# Patient Record
Sex: Female | Born: 1955 | Race: White | Hispanic: No | Smoking: Never smoker
Health system: Southern US, Community
[De-identification: ages and names within clinical notes are randomized; demographics above are authoritative.]

## PROBLEM LIST (undated history)

## (undated) DIAGNOSIS — C50919 Malignant neoplasm of unspecified site of unspecified female breast: Secondary | ICD-10-CM

## (undated) DIAGNOSIS — M1990 Unspecified osteoarthritis, unspecified site: Secondary | ICD-10-CM

## (undated) DIAGNOSIS — E7800 Pure hypercholesterolemia, unspecified: Secondary | ICD-10-CM

## (undated) DIAGNOSIS — Z803 Family history of malignant neoplasm of breast: Secondary | ICD-10-CM

## (undated) DIAGNOSIS — R112 Nausea with vomiting, unspecified: Secondary | ICD-10-CM

## (undated) DIAGNOSIS — Z9889 Other specified postprocedural states: Secondary | ICD-10-CM

## (undated) HISTORY — DX: Malignant neoplasm of unspecified site of unspecified female breast: C50.919

## (undated) HISTORY — PX: CHOLECYSTECTOMY: SHX55

## (undated) HISTORY — PX: OTHER SURGICAL HISTORY: SHX169

## (undated) HISTORY — DX: Pure hypercholesterolemia, unspecified: E78.00

## (undated) HISTORY — DX: Family history of malignant neoplasm of breast: Z80.3

---

## 1998-06-05 ENCOUNTER — Emergency Department (HOSPITAL_COMMUNITY): Admission: EM | Admit: 1998-06-05 | Discharge: 1998-06-05 | Payer: Self-pay | Admitting: Emergency Medicine

## 1999-03-23 ENCOUNTER — Emergency Department (HOSPITAL_COMMUNITY): Admission: EM | Admit: 1999-03-23 | Discharge: 1999-03-23 | Payer: Self-pay | Admitting: Emergency Medicine

## 1999-04-04 ENCOUNTER — Emergency Department (HOSPITAL_COMMUNITY): Admission: EM | Admit: 1999-04-04 | Discharge: 1999-04-04 | Payer: Self-pay | Admitting: Emergency Medicine

## 1999-06-21 ENCOUNTER — Emergency Department (HOSPITAL_COMMUNITY): Admission: EM | Admit: 1999-06-21 | Discharge: 1999-06-21 | Payer: Self-pay | Admitting: Emergency Medicine

## 1999-08-10 ENCOUNTER — Emergency Department (HOSPITAL_COMMUNITY): Admission: EM | Admit: 1999-08-10 | Discharge: 1999-08-10 | Payer: Self-pay | Admitting: *Deleted

## 1999-08-15 ENCOUNTER — Emergency Department (HOSPITAL_COMMUNITY): Admission: EM | Admit: 1999-08-15 | Discharge: 1999-08-15 | Payer: Self-pay | Admitting: Emergency Medicine

## 1999-09-05 ENCOUNTER — Emergency Department (HOSPITAL_COMMUNITY): Admission: EM | Admit: 1999-09-05 | Discharge: 1999-09-05 | Payer: Self-pay | Admitting: Emergency Medicine

## 1999-09-09 ENCOUNTER — Emergency Department (HOSPITAL_COMMUNITY): Admission: EM | Admit: 1999-09-09 | Discharge: 1999-09-09 | Payer: Self-pay | Admitting: Emergency Medicine

## 1999-09-18 ENCOUNTER — Emergency Department (HOSPITAL_COMMUNITY): Admission: EM | Admit: 1999-09-18 | Discharge: 1999-09-18 | Payer: Self-pay | Admitting: Emergency Medicine

## 1999-11-01 ENCOUNTER — Emergency Department (HOSPITAL_COMMUNITY): Admission: EM | Admit: 1999-11-01 | Discharge: 1999-11-01 | Payer: Self-pay | Admitting: Emergency Medicine

## 1999-11-08 ENCOUNTER — Emergency Department (HOSPITAL_COMMUNITY): Admission: EM | Admit: 1999-11-08 | Discharge: 1999-11-08 | Payer: Self-pay | Admitting: *Deleted

## 1999-11-30 ENCOUNTER — Emergency Department (HOSPITAL_COMMUNITY): Admission: EM | Admit: 1999-11-30 | Discharge: 1999-11-30 | Payer: Self-pay | Admitting: Emergency Medicine

## 1999-12-23 ENCOUNTER — Emergency Department (HOSPITAL_COMMUNITY): Admission: EM | Admit: 1999-12-23 | Discharge: 1999-12-23 | Payer: Self-pay | Admitting: Emergency Medicine

## 2000-01-11 ENCOUNTER — Emergency Department (HOSPITAL_COMMUNITY): Admission: EM | Admit: 2000-01-11 | Discharge: 2000-01-11 | Payer: Self-pay | Admitting: Emergency Medicine

## 2000-01-13 ENCOUNTER — Inpatient Hospital Stay (HOSPITAL_COMMUNITY): Admission: EM | Admit: 2000-01-13 | Discharge: 2000-01-16 | Payer: Self-pay | Admitting: *Deleted

## 2000-01-19 ENCOUNTER — Emergency Department (HOSPITAL_COMMUNITY): Admission: EM | Admit: 2000-01-19 | Discharge: 2000-01-19 | Payer: Self-pay | Admitting: *Deleted

## 2000-01-20 ENCOUNTER — Emergency Department (HOSPITAL_COMMUNITY): Admission: EM | Admit: 2000-01-20 | Discharge: 2000-01-20 | Payer: Self-pay | Admitting: Emergency Medicine

## 2000-01-21 ENCOUNTER — Encounter: Payer: Self-pay | Admitting: Emergency Medicine

## 2000-01-22 ENCOUNTER — Emergency Department (HOSPITAL_COMMUNITY): Admission: EM | Admit: 2000-01-22 | Discharge: 2000-01-22 | Payer: Self-pay | Admitting: Emergency Medicine

## 2000-01-22 ENCOUNTER — Ambulatory Visit (HOSPITAL_COMMUNITY): Admission: RE | Admit: 2000-01-22 | Discharge: 2000-01-22 | Payer: Self-pay | Admitting: Emergency Medicine

## 2000-01-22 ENCOUNTER — Encounter: Payer: Self-pay | Admitting: Emergency Medicine

## 2000-03-07 ENCOUNTER — Emergency Department (HOSPITAL_COMMUNITY): Admission: EM | Admit: 2000-03-07 | Discharge: 2000-03-07 | Payer: Self-pay | Admitting: Emergency Medicine

## 2000-04-27 ENCOUNTER — Inpatient Hospital Stay (HOSPITAL_COMMUNITY): Admission: EM | Admit: 2000-04-27 | Discharge: 2000-04-30 | Payer: Self-pay | Admitting: *Deleted

## 2000-06-17 ENCOUNTER — Emergency Department (HOSPITAL_COMMUNITY): Admission: EM | Admit: 2000-06-17 | Discharge: 2000-06-17 | Payer: Self-pay | Admitting: Emergency Medicine

## 2000-10-04 ENCOUNTER — Emergency Department (HOSPITAL_COMMUNITY): Admission: EM | Admit: 2000-10-04 | Discharge: 2000-10-04 | Payer: Self-pay | Admitting: *Deleted

## 2000-10-05 ENCOUNTER — Emergency Department (HOSPITAL_COMMUNITY): Admission: EM | Admit: 2000-10-05 | Discharge: 2000-10-05 | Payer: Self-pay | Admitting: Emergency Medicine

## 2000-10-26 ENCOUNTER — Inpatient Hospital Stay (HOSPITAL_COMMUNITY): Admission: EM | Admit: 2000-10-26 | Discharge: 2000-10-28 | Payer: Self-pay | Admitting: Emergency Medicine

## 2001-03-06 ENCOUNTER — Emergency Department (HOSPITAL_COMMUNITY): Admission: EM | Admit: 2001-03-06 | Discharge: 2001-03-06 | Payer: Self-pay

## 2001-04-30 ENCOUNTER — Emergency Department (HOSPITAL_COMMUNITY): Admission: EM | Admit: 2001-04-30 | Discharge: 2001-04-30 | Payer: Self-pay | Admitting: Emergency Medicine

## 2001-08-26 ENCOUNTER — Emergency Department (HOSPITAL_COMMUNITY): Admission: EM | Admit: 2001-08-26 | Discharge: 2001-08-26 | Payer: Self-pay | Admitting: Emergency Medicine

## 2001-10-03 ENCOUNTER — Emergency Department (HOSPITAL_COMMUNITY): Admission: EM | Admit: 2001-10-03 | Discharge: 2001-10-03 | Payer: Self-pay | Admitting: Emergency Medicine

## 2001-11-15 ENCOUNTER — Emergency Department (HOSPITAL_COMMUNITY): Admission: EM | Admit: 2001-11-15 | Discharge: 2001-11-15 | Payer: Self-pay

## 2001-11-16 ENCOUNTER — Emergency Department (HOSPITAL_COMMUNITY): Admission: EM | Admit: 2001-11-16 | Discharge: 2001-11-16 | Payer: Self-pay | Admitting: Emergency Medicine

## 2001-12-04 ENCOUNTER — Emergency Department (HOSPITAL_COMMUNITY): Admission: EM | Admit: 2001-12-04 | Discharge: 2001-12-04 | Payer: Self-pay | Admitting: *Deleted

## 2002-02-13 ENCOUNTER — Emergency Department (HOSPITAL_COMMUNITY): Admission: EM | Admit: 2002-02-13 | Discharge: 2002-02-13 | Payer: Self-pay | Admitting: Emergency Medicine

## 2002-04-14 ENCOUNTER — Emergency Department (HOSPITAL_COMMUNITY): Admission: EM | Admit: 2002-04-14 | Discharge: 2002-04-14 | Payer: Self-pay | Admitting: Emergency Medicine

## 2002-06-21 ENCOUNTER — Emergency Department (HOSPITAL_COMMUNITY): Admission: EM | Admit: 2002-06-21 | Discharge: 2002-06-21 | Payer: Self-pay | Admitting: Emergency Medicine

## 2002-09-27 ENCOUNTER — Emergency Department (HOSPITAL_COMMUNITY): Admission: EM | Admit: 2002-09-27 | Discharge: 2002-09-27 | Payer: Self-pay | Admitting: Emergency Medicine

## 2002-11-03 ENCOUNTER — Emergency Department (HOSPITAL_COMMUNITY): Admission: EM | Admit: 2002-11-03 | Discharge: 2002-11-04 | Payer: Self-pay | Admitting: Emergency Medicine

## 2003-02-23 ENCOUNTER — Emergency Department (HOSPITAL_COMMUNITY): Admission: EM | Admit: 2003-02-23 | Discharge: 2003-02-23 | Payer: Self-pay | Admitting: Emergency Medicine

## 2003-03-01 ENCOUNTER — Ambulatory Visit (HOSPITAL_BASED_OUTPATIENT_CLINIC_OR_DEPARTMENT_OTHER): Admission: RE | Admit: 2003-03-01 | Discharge: 2003-03-01 | Payer: Self-pay | Admitting: Obstetrics and Gynecology

## 2003-05-21 ENCOUNTER — Emergency Department (HOSPITAL_COMMUNITY): Admission: EM | Admit: 2003-05-21 | Discharge: 2003-05-21 | Payer: Self-pay | Admitting: Emergency Medicine

## 2003-05-22 ENCOUNTER — Emergency Department (HOSPITAL_COMMUNITY): Admission: EM | Admit: 2003-05-22 | Discharge: 2003-05-22 | Payer: Self-pay | Admitting: Emergency Medicine

## 2004-02-10 ENCOUNTER — Inpatient Hospital Stay (HOSPITAL_COMMUNITY): Admission: EM | Admit: 2004-02-10 | Discharge: 2004-02-14 | Payer: Self-pay | Admitting: Emergency Medicine

## 2004-02-19 ENCOUNTER — Emergency Department (HOSPITAL_COMMUNITY): Admission: EM | Admit: 2004-02-19 | Discharge: 2004-02-19 | Payer: Self-pay | Admitting: Emergency Medicine

## 2004-11-14 ENCOUNTER — Ambulatory Visit (HOSPITAL_COMMUNITY): Admission: RE | Admit: 2004-11-14 | Discharge: 2004-11-14 | Payer: Self-pay | Admitting: Internal Medicine

## 2006-10-05 ENCOUNTER — Emergency Department (HOSPITAL_COMMUNITY): Admission: EM | Admit: 2006-10-05 | Discharge: 2006-10-05 | Payer: Self-pay | Admitting: Emergency Medicine

## 2010-10-05 ENCOUNTER — Encounter: Payer: Self-pay | Admitting: Internal Medicine

## 2010-12-03 ENCOUNTER — Emergency Department (HOSPITAL_COMMUNITY): Payer: Self-pay

## 2010-12-03 ENCOUNTER — Emergency Department (HOSPITAL_COMMUNITY)
Admission: EM | Admit: 2010-12-03 | Discharge: 2010-12-03 | Disposition: A | Discharge status: Home / Self Care | Payer: Self-pay | Attending: Emergency Medicine | Admitting: Emergency Medicine

## 2010-12-03 DIAGNOSIS — R110 Nausea: Secondary | ICD-10-CM | POA: Insufficient documentation

## 2010-12-03 DIAGNOSIS — R5381 Other malaise: Secondary | ICD-10-CM | POA: Insufficient documentation

## 2010-12-03 DIAGNOSIS — R42 Dizziness and giddiness: Secondary | ICD-10-CM | POA: Insufficient documentation

## 2010-12-03 DIAGNOSIS — R079 Chest pain, unspecified: Secondary | ICD-10-CM | POA: Insufficient documentation

## 2010-12-03 LAB — COMPREHENSIVE METABOLIC PANEL
ALT: 19 U/L (ref 0–35)
AST: 21 U/L (ref 0–37)
Albumin: 3.7 g/dL (ref 3.5–5.2)
Alkaline Phosphatase: 98 U/L (ref 39–117)
BUN: 10 mg/dL (ref 6–23)
CO2: 26 mEq/L (ref 19–32)
Calcium: 9.2 mg/dL (ref 8.4–10.5)
Chloride: 106 mEq/L (ref 96–112)
Creatinine, Ser: 0.92 mg/dL (ref 0.4–1.2)
GFR calc Af Amer: >60 mL/min (ref >60)
GFR calc non Af Amer: >60 mL/min (ref >60)
Glucose, Bld: 98 mg/dL (ref 70–99)
Potassium: 4.1 mEq/L (ref 3.5–5.1)
Sodium: 140 mEq/L (ref 135–145)
Total Bilirubin: 0.3 mg/dL (ref 0.3–1.2)
Total Protein: 7.1 g/dL (ref 6.0–8.3)

## 2010-12-03 LAB — CBC
HCT: 41.2 % (ref 36.0–46.0)
Hemoglobin: 14 g/dL (ref 12.0–15.0)
MCH: 29.5 pg (ref 26.0–34.0)
MCHC: 34 g/dL (ref 30.0–36.0)
MCV: 86.9 fL (ref 78.0–100.0)
Platelets: 276 10*3/uL (ref 150–400)
RBC: 4.74 MIL/uL (ref 3.87–5.11)
RDW: 14 % (ref 11.5–15.5)
WBC: 8.7 10*3/uL (ref 4.0–10.5)

## 2010-12-03 LAB — URINALYSIS, ROUTINE W REFLEX MICROSCOPIC
Bilirubin Urine: NEGATIVE
Glucose, UA: NEGATIVE mg/dL
Hgb urine dipstick: NEGATIVE
Ketones, ur: NEGATIVE mg/dL
Nitrite: NEGATIVE
Protein, ur: NEGATIVE mg/dL
Specific Gravity, Urine: 1.03 (ref 1.005–1.030)
Urobilinogen, UA: 0.2 mg/dL (ref 0.0–1.0)
pH: 5 (ref 5.0–8.0)

## 2010-12-03 LAB — DIFFERENTIAL
Basophils Absolute: 0 10*3/uL (ref 0.0–0.1)
Basophils Relative: 1 % (ref 0–1)
Eosinophils Absolute: 0.2 10*3/uL (ref 0.0–0.7)
Eosinophils Relative: 3 % (ref 0–5)
Lymphocytes Relative: 26 % (ref 12–46)
Lymphs Abs: 2.3 10*3/uL (ref 0.7–4.0)
Monocytes Absolute: 0.5 10*3/uL (ref 0.1–1.0)
Monocytes Relative: 6 % (ref 3–12)
Neutro Abs: 5.6 10*3/uL (ref 1.7–7.7)
Neutrophils Relative %: 65 % (ref 43–77)

## 2010-12-03 LAB — POCT CARDIAC MARKERS
CKMB, poc: 1.4 ng/mL (ref 1.0–8.0)
Myoglobin, poc: 56.2 ng/mL (ref 12–200)
Troponin i, poc: <0.05 ng/mL (ref 0.00–0.09)

## 2010-12-07 ENCOUNTER — Emergency Department (HOSPITAL_COMMUNITY)
Admission: EM | Admit: 2010-12-07 | Discharge: 2010-12-08 | Disposition: A | Discharge status: Home / Self Care | Payer: Self-pay | Attending: Emergency Medicine | Admitting: Emergency Medicine

## 2010-12-07 ENCOUNTER — Emergency Department (HOSPITAL_COMMUNITY): Payer: Self-pay

## 2010-12-07 DIAGNOSIS — R079 Chest pain, unspecified: Secondary | ICD-10-CM | POA: Insufficient documentation

## 2010-12-07 DIAGNOSIS — R071 Chest pain on breathing: Secondary | ICD-10-CM | POA: Insufficient documentation

## 2010-12-07 DIAGNOSIS — R0602 Shortness of breath: Secondary | ICD-10-CM | POA: Insufficient documentation

## 2010-12-07 LAB — CBC
HCT: 41.2 % (ref 36.0–46.0)
Hemoglobin: 14.1 g/dL (ref 12.0–15.0)
MCH: 29.3 pg (ref 26.0–34.0)
MCHC: 34.2 g/dL (ref 30.0–36.0)
MCV: 85.7 fL (ref 78.0–100.0)
Platelets: 310 10*3/uL (ref 150–400)
RBC: 4.81 MIL/uL (ref 3.87–5.11)
RDW: 13.9 % (ref 11.5–15.5)
WBC: 10.7 10*3/uL — ABNORMAL HIGH (ref 4.0–10.5)

## 2010-12-07 LAB — DIFFERENTIAL
Basophils Absolute: 0.1 10*3/uL (ref 0.0–0.1)
Basophils Relative: 1 % (ref 0–1)
Eosinophils Absolute: 0.3 10*3/uL (ref 0.0–0.7)
Eosinophils Relative: 3 % (ref 0–5)
Lymphocytes Relative: 34 % (ref 12–46)
Lymphs Abs: 3.6 10*3/uL (ref 0.7–4.0)
Monocytes Absolute: 0.6 10*3/uL (ref 0.1–1.0)
Monocytes Relative: 6 % (ref 3–12)
Neutro Abs: 6 10*3/uL (ref 1.7–7.7)
Neutrophils Relative %: 56 % (ref 43–77)

## 2010-12-07 LAB — COMPREHENSIVE METABOLIC PANEL
ALT: 33 U/L (ref 0–35)
AST: 37 U/L (ref 0–37)
Albumin: 3.8 g/dL (ref 3.5–5.2)
Alkaline Phosphatase: 91 U/L (ref 39–117)
BUN: 13 mg/dL (ref 6–23)
CO2: 21 mEq/L (ref 19–32)
Calcium: 9.1 mg/dL (ref 8.4–10.5)
Chloride: 101 mEq/L (ref 96–112)
Creatinine, Ser: 1.03 mg/dL (ref 0.4–1.2)
GFR calc Af Amer: >60 mL/min (ref >60)
GFR calc non Af Amer: 55 mL/min — ABNORMAL LOW (ref >60)
Glucose, Bld: 92 mg/dL (ref 70–99)
Potassium: 4.2 mEq/L (ref 3.5–5.1)
Sodium: 133 mEq/L — ABNORMAL LOW (ref 135–145)
Total Bilirubin: 0.4 mg/dL (ref 0.3–1.2)
Total Protein: 6.8 g/dL (ref 6.0–8.3)

## 2010-12-07 LAB — POCT CARDIAC MARKERS
CKMB, poc: 1.3 ng/mL (ref 1.0–8.0)
Myoglobin, poc: 91.1 ng/mL (ref 12–200)
Troponin i, poc: <0.05 ng/mL (ref 0.00–0.09)

## 2012-05-15 IMAGING — CR DG ABDOMEN ACUTE W/ 1V CHEST
3 series · 3 of 3 positions shown · non-contrast
Comparison: 02/10/2004.

CLINICAL DATA: Nausea and left-sided rib pain.

ACUTE ABDOMEN SERIES (ABDOMEN 2 VIEW & CHEST 1 VIEW)

[view not recorded (1 of 3)]
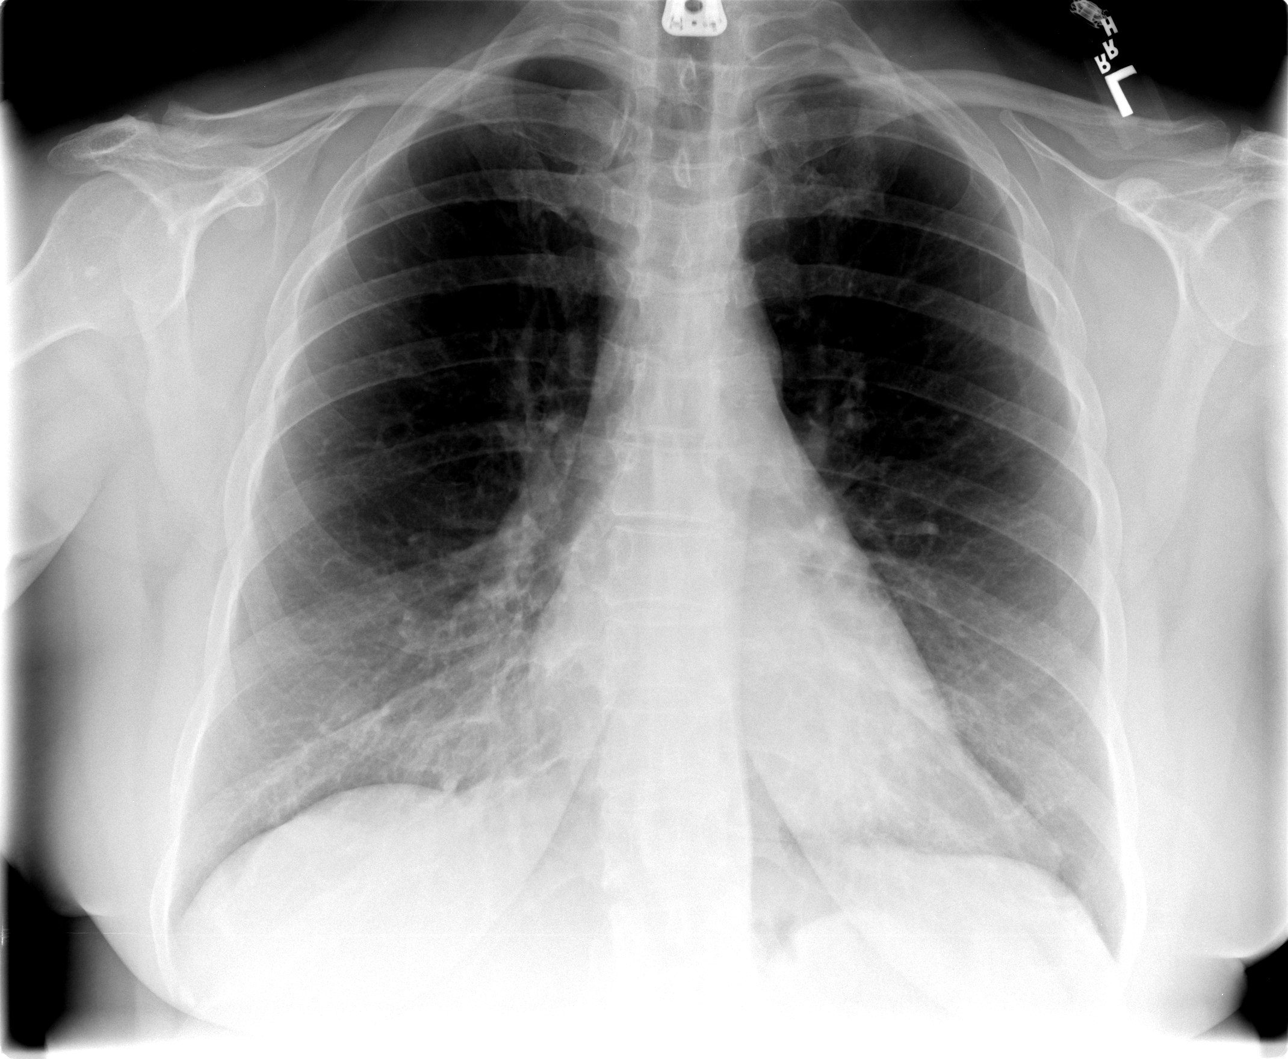

[view not recorded (2 of 3)]
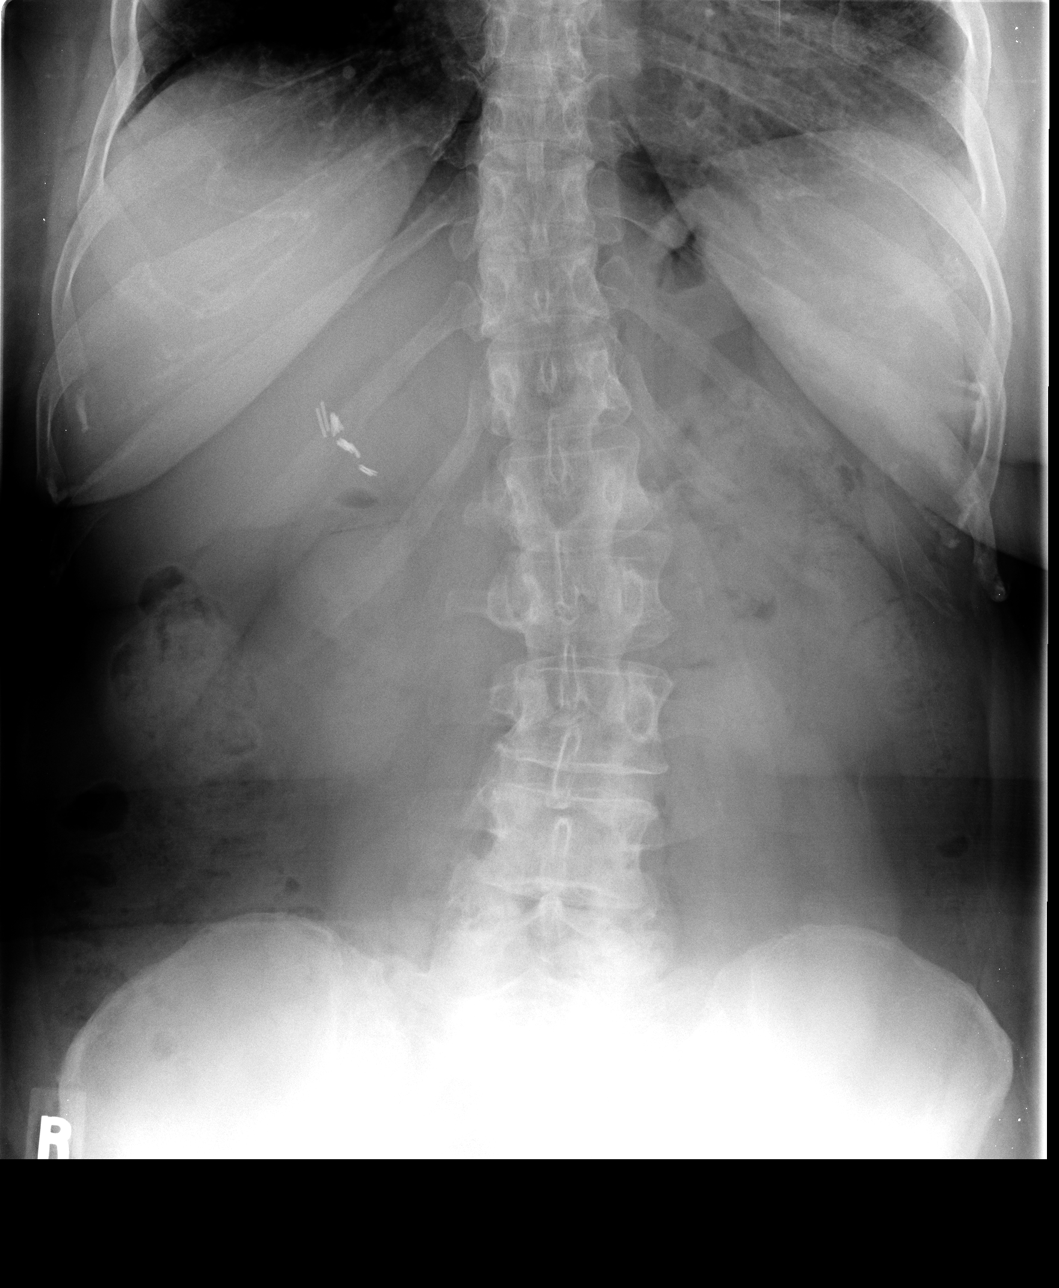

[view not recorded (3 of 3)]
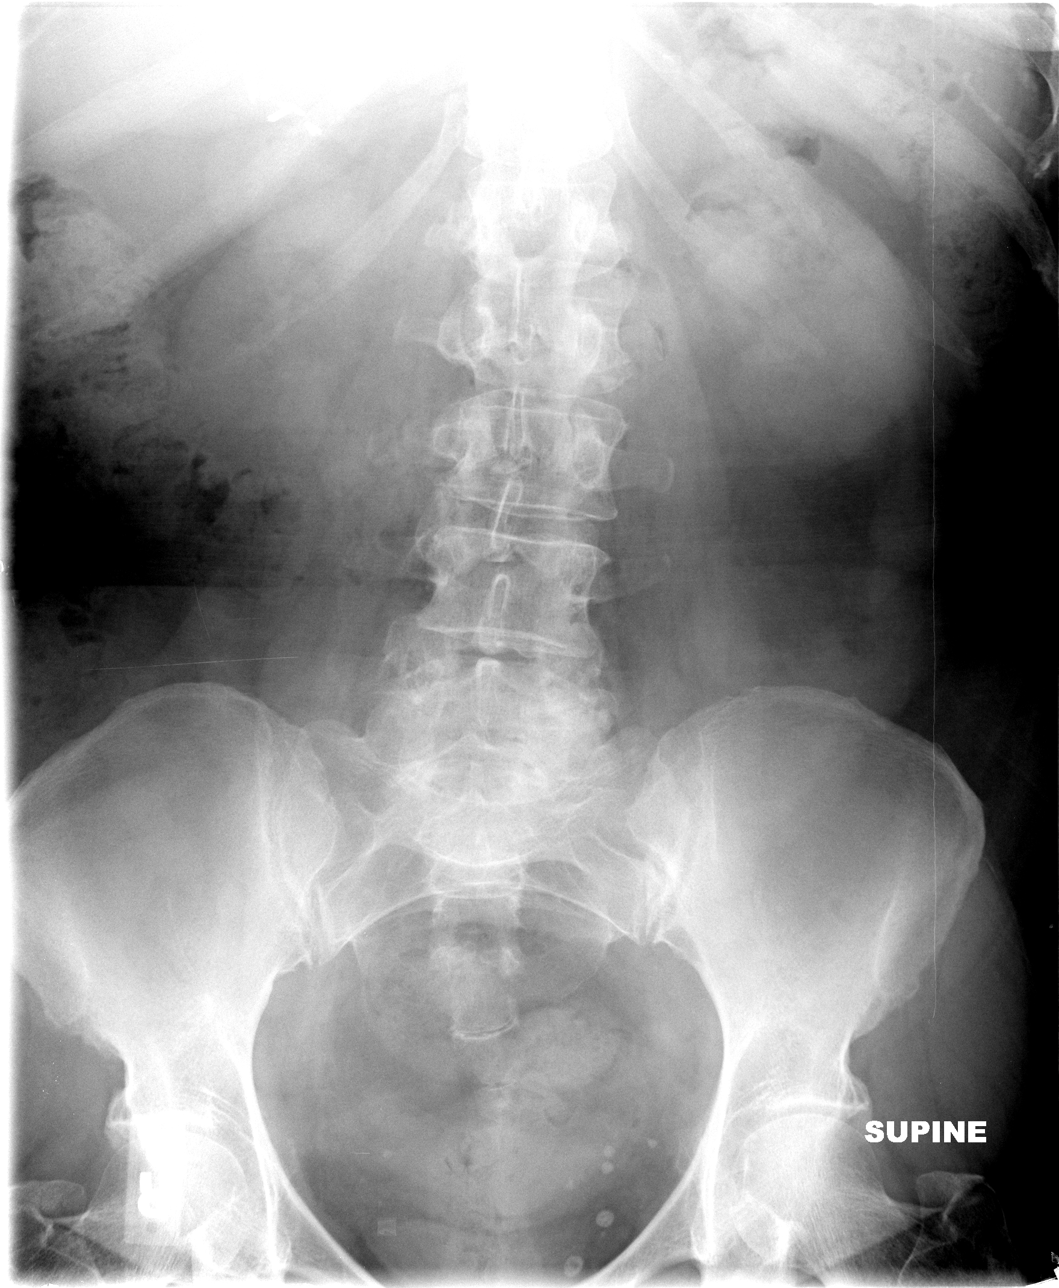

[3 of 3 positions shown; findings below may reference images not displayed]

FINDINGS: The lungs are clear without focal infiltrate, edema,
pneumothorax or pleural effusion. Interstitial markings are
diffusely coarsened with chronic features. The cardiopericardial
silhouette is within normal limits for size.

Upright film shows no evidence for intraperitoneal free air. There
is no evidence for gaseous bowel dilation to suggest obstruction.
Surgical clips in the right upper quadrant suggest prior
cholecystectomy.  Convex leftward rotary scoliosis of the lumbar
spine is stable.
IMPRESSION: 1.  No acute cardiopulmonary findings.
2.  No evidence for bowel obstruction or perforation.

## 2020-05-23 ENCOUNTER — Encounter (HOSPITAL_COMMUNITY): Payer: Self-pay | Admitting: Emergency Medicine

## 2020-05-23 ENCOUNTER — Emergency Department (HOSPITAL_COMMUNITY): Payer: Medicare Other

## 2020-05-23 ENCOUNTER — Emergency Department (HOSPITAL_COMMUNITY)
Admission: EM | Admit: 2020-05-23 | Discharge: 2020-05-23 | Disposition: A | Discharge status: Home / Self Care | Payer: Medicare Other | Attending: Emergency Medicine | Admitting: Emergency Medicine

## 2020-05-23 ENCOUNTER — Other Ambulatory Visit: Payer: Self-pay

## 2020-05-23 DIAGNOSIS — X509XXA Other and unspecified overexertion or strenuous movements or postures, initial encounter: Secondary | ICD-10-CM | POA: Diagnosis not present

## 2020-05-23 DIAGNOSIS — Y939 Activity, unspecified: Secondary | ICD-10-CM | POA: Insufficient documentation

## 2020-05-23 DIAGNOSIS — Y999 Unspecified external cause status: Secondary | ICD-10-CM | POA: Diagnosis not present

## 2020-05-23 DIAGNOSIS — Y929 Unspecified place or not applicable: Secondary | ICD-10-CM | POA: Insufficient documentation

## 2020-05-23 DIAGNOSIS — S42295A Other nondisplaced fracture of upper end of left humerus, initial encounter for closed fracture: Secondary | ICD-10-CM

## 2020-05-23 DIAGNOSIS — M79602 Pain in left arm: Secondary | ICD-10-CM | POA: Diagnosis present

## 2020-05-23 MED ORDER — MORPHINE SULFATE (PF) 4 MG/ML IV SOLN
4.0000 mg | Freq: Once | INTRAVENOUS | Status: AC
  Administered 2020-05-23: 4 mg via INTRAVENOUS
  Filled 2020-05-23: qty 1

## 2020-05-23 MED ORDER — OXYCODONE-ACETAMINOPHEN 5-325 MG PO TABS: 1.0000 | ORAL_TABLET | Freq: Four times a day (QID) | ORAL | 0 refills | Status: AC | PRN

## 2020-05-23 MED ORDER — ONDANSETRON 4 MG PO TBDP
4.0000 mg | ORAL_TABLET | Freq: Once | ORAL | Status: AC
  Administered 2020-05-23: 4 mg via ORAL
  Filled 2020-05-23: qty 1

## 2020-05-23 NOTE — ED Notes (Signed)
Gold ring with red stone removed from pt left ring finger. Pt tolerated well. Ring placed back in pocket of pt shirt.

## 2020-05-23 NOTE — Discharge Instructions (Signed)
Please use the Percocet as prescribed for pain.  You may also use Tylenol and ibuprofen as described below.  Pushing plenty of water.  You may use some ice on your arm to help with the swelling and pain.  Please use Tylenol or ibuprofen for pain.  You may use 600 mg ibuprofen every 6 hours or 1000 mg of Tylenol every 6 hours.  You may choose to alternate between the 2.  This would be most effective.  Not to exceed 4 g of Tylenol within 24 hours.  Not to exceed 3200 mg ibuprofen 24 hours.  Please call tomorrow morning to make an appointment.  I discussed her case with Dr. Stann Mainland whose information I have placed in your discharge instructions.  You may also return to Dr. Aline Brochure who is here in [place] if you would prefer to see someone more local.  Please wear your sling at all times until seen and cleared by orthopedics.

## 2020-05-23 NOTE — ED Triage Notes (Signed)
Pt reports was repositioning cat on pt shoulder and reports left arm pain primarily between top of left shoulder and left elbow. No obvious deformity noted. Pt tearful and limited ROM noted. Decreased cap refill, faint radial pulse.

## 2020-05-23 NOTE — ED Provider Notes (Signed)
[facility] Provider Note   CSN: [id number] Arrival date & time: 05/23/20  1440     History Chief Complaint  Patient presents with  . Arm Pain    Kaitlin Baker is a 64 y.o. female.  HPI Patient is 64 year old female with no pertinent past medical history states that she has not seen a primary care doctor in 15 years.  She is presented today for left humerus pain that began suddenly and with 10/10 pain when she was repositioning her cat who was on her right shoulder when she reached and pushed the cat bite upwards with her left hand she felt a sudden pop and heard a snapping sound in her left humerus and had severe stabbing pain that is been constant since.  She states the cat is about 18 pounds and that the movement was sudden.  She states she has had no other fractures.  No history of osteoporosis but states she has never been screened for this.  She denies any other pain.  She denies any aggravating or mitigating factors apart from movement and touch.  She has tried no medications prior to arrival and came straight to the ER.     History reviewed. No pertinent past medical history.  There are no problems to display for this patient.   History reviewed. No pertinent surgical history.   OB History   No obstetric history on file.     History reviewed. No pertinent family history.  Social History   Tobacco Use  . Smoking status: Never Smoker  . Smokeless tobacco: Never Used  Substance Use Topics  . Alcohol use: Not on file  . Drug use: Not on file    Home Medications Prior to Admission medications   Medication Sig Start Date End Date Taking? Authorizing Provider  oxyCODONE-acetaminophen (PERCOCET/ROXICET) 5-325 MG tablet Take 1 tablet by mouth every 6 (six) hours as needed for up to 7 days for severe pain. 05/23/20 05/30/20  Tedd Sias, PA    Allergies    Codeine  Review of Systems   Review of Systems  Physical Exam Updated Vital  Signs BP 123/74 (BP Location: Right Arm)   Pulse 82   Temp 98.6 F (37 C) (Oral)   Resp 19   Ht 5\' 6"  (1.676 m)   Wt 97.1 kg   SpO2 99%   BMI 34.54 kg/m   Physical Exam Vitals and nursing note reviewed.  Constitutional:      General: She is in acute distress.     Comments: Pleasant 64 year old female in acute distress holding her left arm  HENT:     Head: Normocephalic and atraumatic.     Nose: Nose normal.     Mouth/Throat:     Mouth: Mucous membranes are moist.  Eyes:     General: No scleral icterus. Cardiovascular:     Rate and Rhythm: Normal rate and regular rhythm.     Pulses: Normal pulses.     Heart sounds: Normal heart sounds.     Comments: 3+ metric radial pulses. Pulmonary:     Effort: Pulmonary effort is normal. No respiratory distress.     Breath sounds: No wheezing.  Chest:    Abdominal:     Palpations: Abdomen is soft.     Tenderness: There is no abdominal tenderness.  Musculoskeletal:     Cervical back: Normal range of motion.     Right lower leg: No edema.     Left lower leg:  No edema.     Comments: Tenderness to palpation of the left humerus.  No obvious deformity.  No bruising.  Skin:    General: Skin is warm and dry.     Capillary Refill: Capillary refill takes less than 2 seconds.     Comments: Breast exam completed with significantly indurated left breast in the left upper quadrant of the breast.  There is peau d'orange skin.  Neurological:     Mental Status: She is alert. Mental status is at baseline.     Comments: Radial, ulnar and medial nerve motor and sensation function intact.  Psychiatric:        Mood and Affect: Mood normal.        Behavior: Behavior normal.     ED Results / Procedures / Treatments   Labs (all labs ordered are listed, but only abnormal results are displayed) Labs Reviewed - No data to display  EKG None  Radiology CT HUMERUS LEFT WO CONTRAST  Result Date: 05/23/2020 CLINICAL DATA:  Left humeral fracture  EXAM: CT OF THE UPPER LEFT EXTREMITY WITHOUT CONTRAST TECHNIQUE: Multidetector CT imaging of the upper left extremity was performed according to the standard protocol. COMPARISON:  05/23/2020 FINDINGS: Bones/Joint/Cartilage Oblique proximal left humeral diaphyseal fracture is identified, with fracture fragment separated by approximately 6 mm. There is no evidence of cortical destruction or periosteal reaction. Soft tissue density within the marrow cavity may reflect red marrow or posttraumatic change. If underlying lesion and pathologic fracture is suspected, contrasted MRI is recommended. No other acute displaced fractures. Evidence of prior healed left lateral rib fracture. Ligaments Suboptimally assessed by CT. Muscles and Tendons No evidence of muscular injury. Soft tissues Nonspecific left axillary lymphadenopathy is identified, largest measuring up to 16 mm in short axis. IMPRESSION: 1. Minimally displaced oblique fracture of the proximal left humeral diaphysis. No definite CT evidence of underlying lesion. If pathologic fracture is suspected, MRI may be useful for further evaluation. 2. Nonspecific left axillary lymphadenopathy. Please correlate with any recent imaging of the left breast. Electronically Signed   By: Randa Ngo M.D.   On: 05/23/2020 18:41   DG Humerus Left  Result Date: 05/23/2020 CLINICAL DATA:  Left arm pain EXAM: LEFT HUMERUS - 2+ VIEW COMPARISON:  None. FINDINGS: Frontal and lateral views of the left humerus demonstrate an oblique fracture through the proximal humeral diaphysis, with approximately 11 mm of separation of the fracture fragments. Left shoulder is well aligned. Left elbow is unremarkable. Diffuse soft tissue edema. IMPRESSION: 1. Oblique proximal left humeral diaphyseal fracture with 11 mm of separation of the fracture fragment. Electronically Signed   By: Randa Ngo M.D.   On: 05/23/2020 15:51    Procedures Procedures (including critical care  time)  Medications Ordered in ED Medications  morphine 4 MG/ML injection 4 mg (4 mg Intravenous Given 05/23/20 1526)  ondansetron (ZOFRAN-ODT) disintegrating tablet 4 mg (4 mg Oral Given 05/23/20 1526)  morphine 4 MG/ML injection 4 mg (4 mg Intravenous Given 05/23/20 1618)    ED Course  I have reviewed the triage vital signs and the nursing notes.  Pertinent labs & imaging results that were available during my care of the patient were reviewed by me and considered in my medical decision making (see chart for details).  Patient is a 64 year old female with past medical history detailed above presented today for left humeral pain after lifting her cat up on her shoulder.  She has a displaced oblique left humeral fracture on x-ray.  She has no radial nerve palsy has good distal neurovascular status.  Pain was controlled with 2 doses of morphine.  She states as long she does not move it is not painful currently.  Clinical Course as of May 24 4  Thu May 23, 2020  1654 Discussed with Dr. Stann Mainland    [WF]    Clinical Course User Index [WF] Tedd Sias, [place]   Dr. Stann Mainland recommends sling immobilization and no splints at this time.  Recommend CT scan to evaluate for pathologic fracture given suspicion for this and discharged with follow-up in his office.  CT scan shows no lytic lesion or bony metastasis IMPRESSION:  1. Minimally displaced oblique fracture of the proximal left humeral  diaphysis. No definite CT evidence of underlying lesion. If  pathologic fracture is suspected, MRI may be useful for further  evaluation.  2. Nonspecific left axillary lymphadenopathy. Please correlate with  any recent imaging of the left breast.   I have very high suspicion for breast cancer given her breast exam and the very mild mechanism of injury that resulted in this significant left humeral fracture.  I counseled patient on the need for close follow-up with PCP.  She will also closely follow-up with Dr.  Stann Mainland of orthopedics.  I discussed this case with my attending physician who cosigned this note including patient's presenting symptoms, physical exam, and planned diagnostics and interventions. Attending physician stated agreement with plan or made changes to plan which were implemented.   Attending physician assessed patient at bedside.  Patient understanding of plan for discharge.  And has already had her Percocet filled by her husband.  MDM Rules/Calculators/A&P                           Final Clinical Impression(s) / ED Diagnoses Final diagnoses:  Other closed nondisplaced fracture of proximal end of left humerus, initial encounter    Rx / DC Orders ED Discharge Orders         Ordered    oxyCODONE-acetaminophen (PERCOCET/ROXICET) 5-325 MG tablet  Every 6 hours PRN        05/23/20 1808           Tedd Sias, [place] 05/24/20 0006    Davonna Belling, MD 05/27/20 [phone]

## 2020-06-12 ENCOUNTER — Other Ambulatory Visit: Payer: Self-pay | Admitting: Oncology

## 2020-06-13 ENCOUNTER — Other Ambulatory Visit: Payer: Self-pay | Admitting: *Deleted

## 2020-06-13 DIAGNOSIS — C50912 Malignant neoplasm of unspecified site of left female breast: Secondary | ICD-10-CM

## 2020-06-17 ENCOUNTER — Other Ambulatory Visit: Payer: Self-pay

## 2020-06-17 ENCOUNTER — Encounter (HOSPITAL_COMMUNITY): Payer: Self-pay | Admitting: Emergency Medicine

## 2020-06-17 ENCOUNTER — Inpatient Hospital Stay (HOSPITAL_COMMUNITY): Payer: Medicare Other | Attending: Hematology | Admitting: Hematology

## 2020-06-17 ENCOUNTER — Encounter (HOSPITAL_COMMUNITY): Payer: Self-pay | Admitting: Hematology

## 2020-06-17 ENCOUNTER — Emergency Department (HOSPITAL_COMMUNITY)
Admission: EM | Admit: 2020-06-17 | Discharge: 2020-06-17 | Disposition: A | Discharge status: Home / Self Care | Payer: Medicare Other | Attending: Emergency Medicine | Admitting: Emergency Medicine

## 2020-06-17 ENCOUNTER — Inpatient Hospital Stay (HOSPITAL_COMMUNITY): Payer: Medicare Other

## 2020-06-17 VITALS — BP 129/73 | HR 96 | Temp 97.1°F | Resp 18 | Ht 66.0 in | Wt 213.9 lb

## 2020-06-17 DIAGNOSIS — M549 Dorsalgia, unspecified: Secondary | ICD-10-CM | POA: Insufficient documentation

## 2020-06-17 DIAGNOSIS — M79605 Pain in left leg: Secondary | ICD-10-CM | POA: Insufficient documentation

## 2020-06-17 DIAGNOSIS — Z5321 Procedure and treatment not carried out due to patient leaving prior to being seen by health care provider: Secondary | ICD-10-CM | POA: Insufficient documentation

## 2020-06-17 DIAGNOSIS — C7951 Secondary malignant neoplasm of bone: Secondary | ICD-10-CM | POA: Diagnosis not present

## 2020-06-17 DIAGNOSIS — R112 Nausea with vomiting, unspecified: Secondary | ICD-10-CM | POA: Diagnosis not present

## 2020-06-17 DIAGNOSIS — C50919 Malignant neoplasm of unspecified site of unspecified female breast: Secondary | ICD-10-CM | POA: Insufficient documentation

## 2020-06-17 DIAGNOSIS — R531 Weakness: Secondary | ICD-10-CM | POA: Diagnosis not present

## 2020-06-17 DIAGNOSIS — C50912 Malignant neoplasm of unspecified site of left female breast: Secondary | ICD-10-CM

## 2020-06-17 LAB — CBC WITH DIFFERENTIAL/PLATELET
Abs Immature Granulocytes: 0.04 10*3/uL (ref 0.00–0.07)
Basophils Absolute: 0 10*3/uL (ref 0.0–0.1)
Basophils Relative: 0 %
Eosinophils Absolute: 0.1 10*3/uL (ref 0.0–0.5)
Eosinophils Relative: 1 %
HCT: 34.6 % — ABNORMAL LOW (ref 36.0–46.0)
Hemoglobin: 11.2 g/dL — ABNORMAL LOW (ref 12.0–15.0)
Immature Granulocytes: 1 %
Lymphocytes Relative: 24 %
Lymphs Abs: 1.9 10*3/uL (ref 0.7–4.0)
MCH: 28 pg (ref 26.0–34.0)
MCHC: 32.4 g/dL (ref 30.0–36.0)
MCV: 86.5 fL (ref 80.0–100.0)
Monocytes Absolute: 0.6 10*3/uL (ref 0.1–1.0)
Monocytes Relative: 7 %
Neutro Abs: 5.2 10*3/uL (ref 1.7–7.7)
Neutrophils Relative %: 67 %
Platelets: 306 10*3/uL (ref 150–400)
RBC: 4 MIL/uL (ref 3.87–5.11)
RDW: 15 % (ref 11.5–15.5)
WBC: 7.9 10*3/uL (ref 4.0–10.5)
nRBC: 0 % (ref 0.0–0.2)

## 2020-06-17 LAB — COMPREHENSIVE METABOLIC PANEL
ALT: 16 U/L (ref 0–44)
AST: 28 U/L (ref 15–41)
Albumin: 3.5 g/dL (ref 3.5–5.0)
Alkaline Phosphatase: 230 U/L — ABNORMAL HIGH (ref 38–126)
Anion gap: 11 (ref 5–15)
BUN: 16 mg/dL (ref 8–23)
CO2: 28 mmol/L (ref 22–32)
Calcium: 9.5 mg/dL (ref 8.9–10.3)
Chloride: 99 mmol/L (ref 98–111)
Creatinine, Ser: 0.96 mg/dL (ref 0.44–1.00)
GFR calc Af Amer: >60 mL/min (ref >60)
GFR calc non Af Amer: >60 mL/min (ref >60)
Glucose, Bld: 101 mg/dL — ABNORMAL HIGH (ref 70–99)
Potassium: 3.8 mmol/L (ref 3.5–5.1)
Sodium: 138 mmol/L (ref 135–145)
Total Bilirubin: 0.6 mg/dL (ref 0.3–1.2)
Total Protein: 7.2 g/dL (ref 6.5–8.1)

## 2020-06-17 MED ORDER — OXYCODONE HCL 10 MG PO TABS
10.0000 mg | ORAL_TABLET | Freq: Four times a day (QID) | ORAL | 0 refills | Status: DC | PRN
Start: 2020-06-17 — End: 2020-07-01

## 2020-06-17 MED ORDER — PROCHLORPERAZINE MALEATE 10 MG PO TABS: 10.0000 mg | ORAL_TABLET | Freq: Four times a day (QID) | ORAL | 1 refills | Status: DC | PRN

## 2020-06-17 NOTE — Patient Instructions (Signed)
[facility] at [facility] Discharge Instructions  You were seen today by Dr. Delton Coombes. He went over your recent results; your breast mass is most likely breast cancer. You had labs drawn today for further analysis. You will be referred to a general surgeon to have a biopsy done of the mass to guide treatment. You will be prescribed oxycodone 10 mg to take every 6 hours for your pain and Compazine for your nausea. You will also be scheduled for an MRI of your spine and a PET scan. Dr. Delton Coombes will see you back after your PET scan for follow up.   Thank you for choosing [facility] at [facility] to provide your oncology and hematology care.  To afford each patient quality time with our provider, please arrive at least 15 minutes before your scheduled appointment time.   If you have a lab appointment with the [facility] please come in thru the Main Entrance and check in at the main information desk  You need to re-schedule your appointment should you arrive 10 or more minutes late.  We strive to give you quality time with our providers, and arriving late affects you and other patients whose appointments are after yours.  Also, if you no show three or more times for appointments you may be dismissed from the clinic at the providers discretion.     Again, thank you for choosing [facility].  Our hope is that these requests will decrease the amount of time that you wait before being seen by our physicians.       _____________________________________________________________  Should you have questions after your visit to [facility], please contact our office at [phone] between the hours of 8:00 a.m. and 4:30 p.m.  Voicemails left after 4:00 p.m. will not be returned until the following business day.  For prescription refill requests, have your pharmacy contact our office and allow 72 hours.    Cancer Center Support  Programs:   > Cancer Support Group  2nd Tuesday of the month 1pm-2pm, Journey Room

## 2020-06-17 NOTE — ED Triage Notes (Signed)
Patient states that she has not been able to left her left leg well x 3 days and has had increased pain. Patient states Percocet normally works but has not helped with the pain. Patient also states back pain with nausea and vomiting x 2 since yesterday.

## 2020-06-17 NOTE — Progress Notes (Signed)
[facility] [address], [place] [address]   Patient Care Team: Patient, No Pcp Per as PCP - General (General Practice)  CHIEF COMPLAINTS/PURPOSE OF CONSULTATION:  Newly diagnosed left breast cancer  HISTORY OF PRESENTING ILLNESS:  Kaitlin Baker 64 y.o. female is here because of recent diagnosis of left breast cancer, at the request of Dr. Jana Hakim from [facility].  Today she is accompanied by her daughter, Lavella Lemons. She reports feeling a mass in her left breast back in 2019 but delayed care since she did not have insurance. She reports that she broke her left arm when she went to pick up her cat. She has never had a prior history of cancer or DVT's. She complains of having pain in her left shoulder, pain in her back between the scapula since 10/2, and she also has weakness in her left hip and leg which started last week. Her left leg has been swollen since her 91s. She has not had a mammogram since the age of 68. She has intentionally lost 26 lbs in the past 3 months. She denies having headaches or vision changes. She denies urinary or fecal incontinence. Her appetite is good, but she has been vomiting back food for the past 2 days.  She is adopted; her mother and 2 sisters are deceased from breast cancer. She lives at home with her husband. She is retired; she used to work in an [employer]. She has never smoked. She is reluctant to get radiation or chemo to not saddle her family with medical bills.  I reviewed her records extensively and collaborated the history with the patient.   SUMMARY OF ONCOLOGIC HISTORY: Oncology History   No history exists.    MEDICAL HISTORY:  Past Medical History:  Diagnosis Date  . Breast cancer ([place])   . High cholesterol     SURGICAL HISTORY: Past Surgical History:  Procedure Laterality Date  . CHOLECYSTECTOMY    . neck surgery     plate placed    SOCIAL HISTORY: Social History   Socioeconomic History  . Marital status:  Married    Spouse name: Not on file  . Number of children: 2  . Years of education: Not on file  . Highest education level: Not on file  Occupational History  . Occupation: retired  Tobacco Use  . Smoking status: Never Smoker  . Smokeless tobacco: Never Used  Vaping Use  . Vaping Use: Never used  Substance and Sexual Activity  . Alcohol use: Never  . Drug use: Never  . Sexual activity: Not Currently  Other Topics Concern  . Not on file  Social History Narrative  . Not on file   Social Determinants of Health   Financial Resource Strain: Low Risk   . Difficulty of Paying Living Expenses: Not hard at all  Food Insecurity: No Food Insecurity  . Worried About Charity fundraiser in the Last Year: Never true  . Ran Out of Food in the Last Year: Never true  Transportation Needs: No Transportation Needs  . Lack of Transportation (Medical): No  . Lack of Transportation (Non-Medical): No  Physical Activity: Inactive  . Days of Exercise per Week: 0 days  . Minutes of Exercise per Session: 0 min  Stress: No Stress Concern Present  . Feeling of Stress : Only a little  Social Connections: Moderately Integrated  . Frequency of Communication with Friends and Family: Three times a week  . Frequency of Social  Gatherings with Friends and Family: Three times a week  . Attends Religious Services: 1 to 4 times per year  . Active Member of Clubs or Organizations: No  . Attends Archivist Meetings: Never  . Marital Status: Married  Human resources officer Violence: Not At Risk  . Fear of Current or Ex-Partner: No  . Emotionally Abused: No  . Physically Abused: No  . Sexually Abused: No    FAMILY HISTORY: Family History  Problem Relation Age of Onset  . Breast cancer Mother   . Brain cancer Father   . Breast cancer Sister   . Breast cancer Sister   . Liver cancer Brother   . Alcohol abuse Brother   . Asthma Son     ALLERGIES:  is allergic to codeine.  MEDICATIONS:  Current  Outpatient Medications  Medication Sig Dispense Refill  . acetaminophen (TYLENOL) 325 MG tablet Take 650 mg by mouth every 6 (six) hours as needed.    Marland Kitchen ibuprofen (ADVIL) 200 MG tablet Take 200 mg by mouth every 6 (six) hours as needed.    Marland Kitchen oxyCODONE-acetaminophen (PERCOCET/ROXICET) 5-325 MG tablet oxycodone-acetaminophen 5 mg-325 mg tablet  TAKE 1 TABLET BY MOUTH THREE TIMES DAILY AS NEEDED     No current facility-administered medications for this visit.    REVIEW OF SYSTEMS:   Review of Systems  Constitutional: Positive for appetite change (75%) and fatigue (50%).  Eyes: Negative for eye problems.  Cardiovascular: Positive for leg swelling (L leg chronically swollen).  Gastrointestinal: Positive for nausea and vomiting.  Genitourinary: Negative for bladder incontinence.   Musculoskeletal: Positive for arthralgias (5/10 R shoulder blade and L leg pain).  Neurological: Positive for dizziness and extremity weakness (L hip and leg weakness). Negative for headaches.    PHYSICAL EXAMINATION: ECOG PERFORMANCE STATUS: 2 - Symptomatic, <50% confined to bed  Vitals:   06/17/20 1430  BP: 129/73  Pulse: 96  Resp: 18  Temp: (!) 97.1 F (36.2 C)  SpO2: 97%   Filed Weights   06/17/20 1430  Weight: 213 lb 14.4 oz (97 kg)   Physical Exam Vitals reviewed.  Constitutional:      Appearance: Normal appearance. She is obese.  Cardiovascular:     Rate and Rhythm: Normal rate and regular rhythm.     Pulses: Normal pulses.     Heart sounds: Normal heart sounds.  Pulmonary:     Effort: Pulmonary effort is normal.     Breath sounds: Normal breath sounds.  Chest:     Breasts:        Left: Mass (upper-outer quadrant mass w/ nipple retraction) present.  Neurological:     General: No focal deficit present.     Mental Status: She is alert and oriented to person, place, and time.     Motor: Weakness (4/5 L hip weakness) present.  Psychiatric:        Mood and Affect: Mood normal.         Behavior: Behavior normal.      LABORATORY DATA:  I have reviewed the data as listed No results found for this or any previous visit (from the past 2160 hour(s)).  RADIOGRAPHIC STUDIES: I have personally reviewed the radiological reports and agreed with the findings in the report. CT HUMERUS LEFT WO CONTRAST  Result Date: 05/23/2020 CLINICAL DATA:  Left humeral fracture EXAM: CT OF THE UPPER LEFT EXTREMITY WITHOUT CONTRAST TECHNIQUE: Multidetector CT imaging of the upper left extremity was performed according to the standard protocol. COMPARISON:  05/23/2020 FINDINGS: Bones/Joint/Cartilage Oblique proximal left humeral diaphyseal fracture is identified, with fracture fragment separated by approximately 6 mm. There is no evidence of cortical destruction or periosteal reaction. Soft tissue density within the marrow cavity may reflect red marrow or posttraumatic change. If underlying lesion and pathologic fracture is suspected, contrasted MRI is recommended. No other acute displaced fractures. Evidence of prior healed left lateral rib fracture. Ligaments Suboptimally assessed by CT. Muscles and Tendons No evidence of muscular injury. Soft tissues Nonspecific left axillary lymphadenopathy is identified, largest measuring up to 16 mm in short axis. IMPRESSION: 1. Minimally displaced oblique fracture of the proximal left humeral diaphysis. No definite CT evidence of underlying lesion. If pathologic fracture is suspected, MRI may be useful for further evaluation. 2. Nonspecific left axillary lymphadenopathy. Please correlate with any recent imaging of the left breast. Electronically Signed   By: Randa Ngo M.D.   On: 05/23/2020 18:41   DG Humerus Left  Result Date: 05/23/2020 CLINICAL DATA:  Left arm pain EXAM: LEFT HUMERUS - 2+ VIEW COMPARISON:  None. FINDINGS: Frontal and lateral views of the left humerus demonstrate an oblique fracture through the proximal humeral diaphysis, with approximately 11 mm  of separation of the fracture fragments. Left shoulder is well aligned. Left elbow is unremarkable. Diffuse soft tissue edema. IMPRESSION: 1. Oblique proximal left humeral diaphyseal fracture with 11 mm of separation of the fracture fragment. Electronically Signed   By: Randa Ngo M.D.   On: 05/23/2020 15:51     ASSESSMENT:  1.  Clinical metastatic breast cancer: -She reported feeling the left breast mass for the past 2 years.  She did not seek medical attention.  She did not have mammograms for the past 30 years. -Reported 26 pound weight loss which was intentional in the last 6 months. -Developed fracture of the proximal left humeral diaphysis when she picked up her cat. -CT humerus left without contrast on 05/23/2020 shows minimally displaced oblique fracture of the proximal left humeral diaphysis with no definitive CT evidence of underlying lesion.  Nonspecific left axillary adenopathy. -Physical exam today reveals left breast mass occupying the majority of the outer quadrant with skin thickening.  Could not palpate the axillary lymph node as she was unable to lift her arm.  2.  Social/family history: -Lives at home with husband.  Never smoker.  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    PLAN:  1.  Clinical metastatic left breast cancer to the bones: -Pathological fracture of left humerus is consistent with metastatic breast cancer at least clinically. -Recommend PET CT scan to confirm findings. -Recommend left breast biopsy for receptor status. -Follow-up after the PET scan.  2.  Left leg weakness: -She reported weakness of the left leg and feeling heavy since mid last week. -No loss of bowel or bladder function. -Recommend thoracic and lumbar MRI with and without contrast.  3.  Right shoulder blade pain: -She is taking Percocet 5/325 mg every 4 hours which is not helping. -We will increase oxycodone to 10 mg  every 6 hours as needed. -We will also send Compazine as she is complaining of nausea on and off.  4.  Family history: -Given her extensive family history, recommended genetic testing.   All questions were answered. The patient knows to call the clinic with any problems, questions or concerns.    Derek Jack, MD 06/17/20 3:55 PM  [place] [phone]   I, Quillian Quince  Khash[place], am acting as a scribe for Dr. Sanda Linger.  I, Derek Jack MD, have reviewed the above documentation for accuracy and completeness, and I agree with the above.

## 2020-06-18 LAB — CANCER ANTIGEN 15-3: CA 15-3: 60.4 U/mL — ABNORMAL HIGH (ref 0.0–25.0)

## 2020-06-24 ENCOUNTER — Ambulatory Visit (HOSPITAL_COMMUNITY)
Admission: RE | Admit: 2020-06-24 | Discharge: 2020-06-24 | Disposition: A | Discharge status: Home / Self Care | Payer: Medicare Other | Source: Ambulatory Visit | Attending: Hematology | Admitting: Hematology

## 2020-06-24 ENCOUNTER — Other Ambulatory Visit: Payer: Self-pay

## 2020-06-24 DIAGNOSIS — K449 Diaphragmatic hernia without obstruction or gangrene: Secondary | ICD-10-CM | POA: Diagnosis not present

## 2020-06-24 DIAGNOSIS — C50912 Malignant neoplasm of unspecified site of left female breast: Secondary | ICD-10-CM | POA: Diagnosis not present

## 2020-06-24 DIAGNOSIS — I700 Atherosclerosis of aorta: Secondary | ICD-10-CM | POA: Diagnosis not present

## 2020-06-24 DIAGNOSIS — C7951 Secondary malignant neoplasm of bone: Secondary | ICD-10-CM | POA: Diagnosis not present

## 2020-06-24 DIAGNOSIS — K5730 Diverticulosis of large intestine without perforation or abscess without bleeding: Secondary | ICD-10-CM | POA: Insufficient documentation

## 2020-06-24 MED ORDER — FLUDEOXYGLUCOSE F - 18 (FDG) INJECTION
12.0000 | Freq: Once | INTRAVENOUS | Status: AC | PRN
  Administered 2020-06-24: 12 via INTRAVENOUS

## 2020-06-25 ENCOUNTER — Ambulatory Visit (INDEPENDENT_AMBULATORY_CARE_PROVIDER_SITE_OTHER): Payer: Medicare Other | Admitting: General Surgery

## 2020-06-25 ENCOUNTER — Encounter: Payer: Self-pay | Admitting: General Surgery

## 2020-06-25 VITALS — BP 128/82 | HR 95 | Temp 97.6°F | Resp 20 | Ht 66.0 in | Wt 214.0 lb

## 2020-06-25 DIAGNOSIS — C50912 Malignant neoplasm of unspecified site of left female breast: Secondary | ICD-10-CM

## 2020-06-25 DIAGNOSIS — C7951 Secondary malignant neoplasm of bone: Secondary | ICD-10-CM

## 2020-06-25 NOTE — Patient Instructions (Signed)
Skin Biopsy A skin biopsy is a procedure to remove a sample of your skin (lesion) so that it can be checked under a microscope. You may need a skin biopsy if you have a skin disease or abnormal changes in your skin. Tell a health care provider about:  Any allergies you have.  All medicines you are taking, including vitamins, herbs, eye drops, creams, and over-the-counter medicines.  Any problems you or family members have had with anesthetic medicines.  Any blood disorders you have.  Any surgeries you have had.  Any medical conditions you have or have had.  Whether you are pregnant or may be pregnant. What are the risks? Generally, this is a safe procedure. However, problems may occur, including:  Infection.  Bleeding.  Allergic reaction to medicines.  Scarring. What happens before the procedure?  Ask your health care provider about: ? Changing or stopping your regular medicines. This is especially important if you are taking blood thinners. ? Taking medicines such as aspirin and ibuprofen. These medicines can thin your blood. Do not take these medicines unless your health care provider tells you to take them. ? Taking over-the-counter medicines, vitamins, herbs, and supplements.  Ask your health care provider if you will need someone to take you home from the hospital or clinic after the procedure.  Ask your health care provider how your biopsy site will be marked or identified.  Ask your health care provider what steps will be taken to help prevent infection. These may include: ? Removing hair at the surgery site. ? Washing skin with a germ-killing soap. ? Taking antibiotic medicine. What happens during the procedure?   You may be given medicine to numb the area (local anesthetic).  Your health care provider will take a sample using one of these steps, depending on the type of skin problem that you have: ? Shave biopsy. Your health care provider will shave away  layers of your skin lesion with a sharp blade. After shaving, a gel or ointment may be used to control bleeding. ? Punch biopsy. Your health care provider will use a tool to remove all or part of the lesion. This leaves a small hole about the width of a pencil eraser. The area may be covered with a gel or ointment. ? Excisional or incisional biopsy. Your health care provider will use a surgical blade to remove all or part of your lesion.  Your skin biopsy site may be closed with stitches (sutures).  A bandage (dressing) will be applied. The procedure may vary among health care providers and hospitals. What happens after the procedure?  Your skin sample will be sent to a lab for tests.  Your skin biopsy site will be watched to make sure that it stops bleeding.  You will be given instructions on how to care for your biopsy site.  It is up to you to get the results of your procedure. Ask your health care provider, or the department that is doing the procedure, when your results will be ready. Summary  A skin biopsy is a procedure to remove a sample of your skin (lesion) so that it can be checked under a microscope.  Tell a health care provider about your medical history and all medicines you are taking, including vitamins, herbs, eye drops, creams, and over-the-counter medicines.  Before the procedure, ask your health care provider about changing or stopping your regular medicines.  During the procedure, your health care provider will take a skin sample from  the area where you have the skin problem.  After the procedure, your skin sample will be sent to a laboratory for testing. This information is not intended to replace advice given to you by your health care provider. Make sure you discuss any questions you have with your health care provider. Document Revised: 02/28/2018 Document Reviewed: 02/28/2018 Elsevier Patient Education  [place].

## 2020-06-26 NOTE — Progress Notes (Signed)
Kaitlin Baker; [id number]; [birth year]   HPI Patient is a 64 year old white female who was referred to my care by Dr. Delton Coombes of oncology for a skin biopsy of her left breast.  Patient was recently diagnosed with a left breast cancer with evidence of metastatic disease to the bone, causing a left arm fracture.  She has been noticed to have an enlarged left breast with a large mass present and skin changes in the left breast.  Patient had a recent PET scan which revealed metastatic malignancy. Past Medical History:  Diagnosis Date  . Breast cancer ([place])   . High cholesterol     Past Surgical History:  Procedure Laterality Date  . CHOLECYSTECTOMY    . neck surgery     plate placed    Family History  Problem Relation Age of Onset  . Breast cancer Mother   . Brain cancer Father   . Breast cancer Sister   . Breast cancer Sister   . Liver cancer Brother   . Alcohol abuse Brother   . Asthma Son     Current Outpatient Medications on File Prior to Visit  Medication Sig Dispense Refill  . acetaminophen (TYLENOL) 325 MG tablet Take 650 mg by mouth every 6 (six) hours as needed.    Marland Kitchen ibuprofen (ADVIL) 200 MG tablet Take 200 mg by mouth every 6 (six) hours as needed.    . Oxycodone HCl 10 MG TABS Take 1 tablet (10 mg total) by mouth every 6 (six) hours as needed. 60 tablet 0  . oxyCODONE-acetaminophen (PERCOCET/ROXICET) 5-325 MG tablet oxycodone-acetaminophen 5 mg-325 mg tablet  TAKE 1 TABLET BY MOUTH THREE TIMES DAILY AS NEEDED    . prochlorperazine (COMPAZINE) 10 MG tablet Take 1 tablet (10 mg total) by mouth every 6 (six) hours as needed for nausea or vomiting. 60 tablet 1   No current facility-administered medications on file prior to visit.    Allergies  Allergen Reactions  . Codeine     Nausea,emesis    Social History   Substance and Sexual Activity  Alcohol Use Never    Social History   Tobacco Use  Smoking Status Never Smoker  Smokeless Tobacco Never Used     Review of Systems  Constitutional: Negative.   HENT: Negative.   Eyes: Negative.   Respiratory: Negative.   Cardiovascular: Negative.   Gastrointestinal: Negative.   Genitourinary: Negative.   Musculoskeletal: Negative.   Skin: Negative.   Neurological: Positive for tremors.  Endo/Heme/Allergies: Negative.   Psychiatric/Behavioral: Negative.     Objective   Vitals:   06/25/20 1405  BP: 128/82  Pulse: 95  Resp: 20  Temp: 97.6 F (36.4 C)  SpO2: 94%    Physical Exam Vitals reviewed.  Constitutional:      Appearance: Normal appearance. She is not ill-appearing.  HENT:     Head: Normocephalic and atraumatic.  Cardiovascular:     Rate and Rhythm: Normal rate and regular rhythm.     Heart sounds: Normal heart sounds. No murmur heard.  No friction rub. No gallop.   Pulmonary:     Effort: Pulmonary effort is normal. No respiratory distress.     Breath sounds: Normal breath sounds. No stridor. No wheezing, rhonchi or rales.  Musculoskeletal:        General: Signs of injury present.     Comments: Left arm in a sling.  Skin:    General: Skin is warm and dry.  Neurological:     Mental Status: She  is alert and oriented to person, place, and time.   Breast: Enlarged left breast with peau d'orange changes in a bandlike fashion including the nipple with a large centrally located mass.  Unable to access the left axilla due to her recent humeral fracture on the left.  PET scan results reviewed.  Dr. Tomie China notes reviewed Assessment  Large left breast mass with extensive metastatic disease to bone and lymph nodes. Plan   Patient realizes that she has metastatic disease that is most likely secondary to her left breast cancer.  She is reluctant to undergo further work-up including left breast skin biopsy as she feels she would like to switch to palliative care.  Will discuss this with Dr. Delton Coombes.

## 2020-06-27 ENCOUNTER — Ambulatory Visit (HOSPITAL_COMMUNITY): Payer: Medicare Other

## 2020-06-27 ENCOUNTER — Ambulatory Visit (HOSPITAL_COMMUNITY): Payer: Medicare Other | Admitting: Hematology

## 2020-07-01 ENCOUNTER — Other Ambulatory Visit (HOSPITAL_COMMUNITY): Payer: Self-pay | Admitting: Hematology

## 2020-07-01 ENCOUNTER — Telehealth (HOSPITAL_COMMUNITY): Payer: Self-pay | Admitting: *Deleted

## 2020-07-01 ENCOUNTER — Other Ambulatory Visit (HOSPITAL_COMMUNITY): Payer: Self-pay | Admitting: *Deleted

## 2020-07-01 ENCOUNTER — Inpatient Hospital Stay (HOSPITAL_BASED_OUTPATIENT_CLINIC_OR_DEPARTMENT_OTHER): Payer: Medicare Other | Admitting: Hematology

## 2020-07-01 ENCOUNTER — Other Ambulatory Visit (HOSPITAL_COMMUNITY): Payer: Self-pay

## 2020-07-01 ENCOUNTER — Other Ambulatory Visit: Payer: Self-pay

## 2020-07-01 VITALS — BP 151/91 | HR 95 | Resp 18 | Wt 209.2 lb

## 2020-07-01 DIAGNOSIS — C50912 Malignant neoplasm of unspecified site of left female breast: Secondary | ICD-10-CM

## 2020-07-01 MED ORDER — OXYCODONE HCL 10 MG PO TABS
10.0000 mg | ORAL_TABLET | Freq: Four times a day (QID) | ORAL | 0 refills | Status: DC | PRN
Start: 2020-07-01 — End: 2020-07-17

## 2020-07-01 NOTE — Telephone Encounter (Signed)
Created in error

## 2020-07-01 NOTE — Patient Instructions (Addendum)
[place] at [facility] Discharge Instructions  You were seen today by Dr. Delton Coombes. He went over your recent results and scans, showing metastatic activity in your left breast and the bones of your left arm, multiple vertebrae and pelvis. You will be scheduled for an ultrasound-guided biopsy of your left breast. If your mass is estrogen-sensitive, then you will be started on anastrozole to decrease estrogen-driven growth. The other medication will be Ibrance which can decrease your white blood cell count and predispose you to infections. Dr. Delton Coombes will see you back after the biopsy for follow up.   Thank you for choosing [place] at [facility] to provide your oncology and hematology care.  To afford each patient quality time with our provider, please arrive at least 15 minutes before your scheduled appointment time.   If you have a lab appointment with the [facility] please come in thru the Main Entrance and check in at the main information desk  You need to re-schedule your appointment should you arrive 10 or more minutes late.  We strive to give you quality time with our providers, and arriving late affects you and other patients whose appointments are after yours.  Also, if you no show three or more times for appointments you may be dismissed from the clinic at the providers discretion.     Again, thank you for choosing [facility].  Our hope is that these requests will decrease the amount of time that you wait before being seen by our physicians.       _____________________________________________________________  Should you have questions after your visit to [facility], please contact our office at [phone] between the hours of 8:00 a.m. and 4:30 p.m.  Voicemails left after 4:00 p.m. will not be returned until the following business day.  For prescription refill requests, have your pharmacy contact our  office and allow 72 hours.    Cancer Center Support Programs:   > Cancer Support Group  2nd Tuesday of the month 1pm-2pm, Journey Room

## 2020-07-01 NOTE — Progress Notes (Signed)
[place], [place] [address]   CLINIC:  Medical Oncology/Hematology  PCP:  Patient, No Pcp Per None None   REASON FOR VISIT:  Follow-up for left breast cancer  PRIOR THERAPY: None  NGS Results: Not done  CURRENT THERAPY: Under work-up  BRIEF ONCOLOGIC HISTORY:  Oncology History   No history exists.    CANCER STAGING: Cancer Staging No matching staging information was found for the patient.  INTERVAL HISTORY:  Kaitlin Baker, a 64 y.o. female, returns for routine follow-up of her left breast cancer. Kaitlin Baker was last seen on 06/17/2020.  Today she is accompanied by her daughter. The pain in her left arm is improving and only appears when she does certain movements. She takes 1 tablet of Percocet every 6 to 8 hours for her pain; she has not received her oxycodone yet. She reports feeling nausea for 6 to 8 hours after taking Compazine. The weakness in her left leg is improving but she still has issues walking or lifting it up on the couch.   She does not want to do the MRI.    REVIEW OF SYSTEMS:  Review of Systems  Constitutional: Negative for appetite change and fatigue.  Gastrointestinal: Positive for nausea.  Neurological: Positive for extremity weakness (L leg weakness improving).  All other systems reviewed and are negative.   PAST MEDICAL/SURGICAL HISTORY:  Past Medical History:  Diagnosis Date  . Breast cancer ([place])   . High cholesterol    Past Surgical History:  Procedure Laterality Date  . CHOLECYSTECTOMY    . neck surgery     plate placed    SOCIAL HISTORY:  Social History   Socioeconomic History  . Marital status: Married    Spouse name: Not on file  . Number of children: 2  . Years of education: Not on file  . Highest education level: Not on file  Occupational History  . Occupation: retired  Tobacco Use  . Smoking status: Never Smoker  . Smokeless tobacco: Never Used  Vaping Use  . Vaping Use:  Never used  Substance and Sexual Activity  . Alcohol use: Never  . Drug use: Never  . Sexual activity: Not Currently  Other Topics Concern  . Not on file  Social History Narrative  . Not on file   Social Determinants of Health   Financial Resource Strain: Low Risk   . Difficulty of Paying Living Expenses: Not hard at all  Food Insecurity: No Food Insecurity  . Worried About Charity fundraiser in the Last Year: Never true  . Ran Out of Food in the Last Year: Never true  Transportation Needs: No Transportation Needs  . Lack of Transportation (Medical): No  . Lack of Transportation (Non-Medical): No  Physical Activity: Inactive  . Days of Exercise per Week: 0 days  . Minutes of Exercise per Session: 0 min  Stress: No Stress Concern Present  . Feeling of Stress : Only a little  Social Connections: Moderately Integrated  . Frequency of Communication with Friends and Family: Three times a week  . Frequency of Social Gatherings with Friends and Family: Three times a week  . Attends Religious Services: 1 to 4 times per year  . Active Member of Clubs or Organizations: No  . Attends Archivist Meetings: Never  . Marital Status: Married  Human resources officer Violence: Not At Risk  . Fear of Current or Ex-Partner: No  . Emotionally Abused: No  .  Physically Abused: No  . Sexually Abused: No    FAMILY HISTORY:  Family History  Problem Relation Age of Onset  . Breast cancer Mother   . Brain cancer Father   . Breast cancer Sister   . Breast cancer Sister   . Liver cancer Brother   . Alcohol abuse Brother   . Asthma Son     CURRENT MEDICATIONS:  Current Outpatient Medications  Medication Sig Dispense Refill  . acetaminophen (TYLENOL) 325 MG tablet Take 650 mg by mouth every 6 (six) hours as needed.    Marland Kitchen ibuprofen (ADVIL) 200 MG tablet Take 200 mg by mouth every 6 (six) hours as needed.    Marland Kitchen oxyCODONE-acetaminophen (PERCOCET/ROXICET) 5-325 MG tablet  oxycodone-acetaminophen 5 mg-325 mg tablet  TAKE 1 TABLET BY MOUTH THREE TIMES DAILY AS NEEDED    . Oxycodone HCl 10 MG TABS Take 1 tablet (10 mg total) by mouth every 6 (six) hours as needed. (Patient not taking: Reported on 07/01/2020) 60 tablet 0   No current facility-administered medications for this visit.    ALLERGIES:  Allergies  Allergen Reactions  . Codeine     Nausea,emesis    PHYSICAL EXAM:  Performance status (ECOG): 2 - Symptomatic, <50% confined to bed  Vitals:   07/01/20 0920  BP: (!) 151/91  Pulse: 95  Resp: 18  SpO2: 96%   Wt Readings from Last 3 Encounters:  07/01/20 209 lb 3.2 oz (94.9 kg)  06/25/20 214 lb (97.1 kg)  06/17/20 213 lb 14.4 oz (97 kg)   Physical Exam Vitals reviewed.  Constitutional:      Appearance: Normal appearance. She is obese.  Musculoskeletal:     Comments: Left arm in sling  Neurological:     General: No focal deficit present.     Mental Status: She is alert and oriented to person, place, and time.  Psychiatric:        Mood and Affect: Mood normal.        Behavior: Behavior normal.      LABORATORY DATA:  I have reviewed the labs as listed.  CBC Latest Ref Rng & Units 06/17/2020 12/07/2010 12/03/2010  WBC 4.0 - 10.5 K/uL 7.9 10.7(H) 8.7  Hemoglobin 12.0 - 15.0 g/dL 11.2(L) 14.1 14.0  Hematocrit 36 - 46 % 34.6(L) 41.2 41.2  Platelets 150 - 400 K/uL 306 310 276   CMP Latest Ref Rng & Units 06/17/2020 12/07/2010 12/03/2010  Glucose 70 - 99 mg/dL 101(H) 92 98  BUN 8 - 23 mg/dL '16 13 10  ' Creatinine 0.44 - 1.00 mg/dL 0.96 1.03 0.92  Sodium 135 - 145 mmol/L 138 133(L) 140  Potassium 3.5 - 5.1 mmol/L 3.8 4.2 4.1  Chloride 98 - 111 mmol/L 99 101 106  CO2 22 - 32 mmol/L '28 21 26  ' Calcium 8.9 - 10.3 mg/dL 9.5 9.1 9.2  Total Protein 6.5 - 8.1 g/dL 7.2 6.8 7.1  Total Bilirubin 0.3 - 1.2 mg/dL 0.6 0.4 0.3  Alkaline Phos 38 - 126 U/L 230(H) 91 98  AST 15 - 41 U/L 28 37 21  ALT 0 - 44 U/L 16 33 19    DIAGNOSTIC IMAGING:  I have  independently reviewed the scans and discussed with the patient. NM PET Image Initial (PI) Skull Base To Thigh  Result Date: 06/25/2020 CLINICAL DATA:  Initial treatment strategy for metastatic breast cancer with reported pathologic left humeral fracture. EXAM: NUCLEAR MEDICINE PET SKULL BASE TO THIGH TECHNIQUE: 12.0 mCi F-18 FDG was injected intravenously.  Full-ring PET imaging was performed from the skull base to thigh after the radiotracer. CT data was obtained and used for attenuation correction and anatomic localization. Fasting blood glucose: 86 mg/dl COMPARISON:  05/23/2020 CT left humerus. 02/10/2004 CT abdomen/pelvis. FINDINGS: Mediastinal blood pool activity: SUV max 2.3 Liver activity: SUV max NA NECK: No hypermetabolic lymph nodes in the neck. Incidental CT findings: none CHEST: Hypermetabolic irregular 8.1 x 4.0 cm left breast mass with max SUV 4.3 (series 3/image 176) with asymmetric left breast skin thickening. Multiple enlarged hypermetabolic left axillary lymph nodes, largest 1.6 cm in the low left axilla with max SUV 4.1 (series 3/image 174). No enlarged or hypermetabolic right axillary, mediastinal or hilar lymph nodes. No hypermetabolic pulmonary findings. Incidental CT findings: Atherosclerotic nonaneurysmal thoracic aorta. No acute consolidative airspace disease, lung masses or significant pulmonary nodules. ABDOMEN/PELVIS: No abnormal hypermetabolic activity within the liver, pancreas, adrenal glands, or spleen. No hypermetabolic lymph nodes in the abdomen or pelvis. Incidental CT findings: Cholecystectomy. Small hiatal hernia. Marked sigmoid diverticulosis. SKELETON: Extensive multifocal hypermetabolic mixed lytic and sclerotic osseous metastases throughout the axial and proximal appendicular skeleton, including the calvarium, thoracolumbar spine, bilateral ribs, sternum, sacrum, iliac crests and proximal femora and humeri. Representative proximal left femoral metaphysis lesion with max  SUV 6.8. Representative midline calvarial lytic lesion near the vertex with max SUV 3.9. Representative lateral right fifth rib lesion with max SUV 5.2. Representative L5 vertebral body lesion with max SUV 7.6. Pathologic proximal left humerus metadiaphysis fracture with evidence of healing response. Incidental CT findings: none IMPRESSION: 1. Irregular hypermetabolic 8.1 x 4.0 cm left breast mass compatible with primary left breast malignancy. 2. Hypermetabolic left axillary nodal metastases. 3. Extensive multifocal hypermetabolic mixed lytic and sclerotic osseous metastatic disease throughout the axial and proximal appendicular skeleton. Pathologic proximal left humerus fracture with healing response. 4. Chronic findings include: Aortic Atherosclerosis (ICD10-I70.0). Small hiatal hernia. Marked sigmoid diverticulosis. Electronically Signed   By: Ilona Sorrel M.D.   On: 06/25/2020 09:52     ASSESSMENT:  1.  Clinical metastatic breast cancer: -She reported feeling the left breast mass for the past 2 years.  She did not seek medical attention.  She did not have mammograms for the past 30 years. -Reported 26 pound weight loss which was intentional in the last 6 months. -Developed fracture of the proximal left humeral diaphysis when she picked up her cat. -CT humerus left without contrast on 05/23/2020 shows minimally displaced oblique fracture of the proximal left humeral diaphysis with no definitive CT evidence of underlying lesion.  Nonspecific left axillary adenopathy. -Physical exam today reveals left breast mass occupying the majority of the outer quadrant with skin thickening.  Could not palpate the axillary lymph node as she was unable to lift her arm. -PET scan on 06/24/2020 showed irregular hypermetabolic 8.1 x 4.0 left breast mass compatible with malignancy with left axillary nodal metastasis.  Extensive multifocal mixed lytic and sclerotic metastatic disease throughout the axial and proximal  appendicular skeleton.  2.  Social/family history: -Lives at home with husband.  Never smoker.  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   PLAN:  1.  Clinical metastatic left breast cancer to the bones: -I have reviewed images of the PET scan with the patient and her daughter. -Labs from 06/17/2020 were reviewed.  Alk phos is elevated secondary to bone mets.  Calcium is normal.  CA 15-3 was elevated at 60.4. -Patient was evaluated by Dr.  Jenkins and did not want to have biopsy done.  We will arrange the biopsy by IR either by CT guidance or ultrasound guidance. -Will check for receptor status.  Most likely candidate for anastrozole and CDK 4/6 inhibitor if HER-2 negative and ER positive.  2.  Left leg weakness: -She reported the left leg weakness is slightly better.  She declined MRI of the thoracic and lumbar spine. -She still has some difficulty lifting the left leg with some pain.  3.  Right shoulder blade pain: -She could not fill the oxycodone prescription due to problems with the pharmacy.  We have printed a prescription and gave it to her today.  She is currently taking Percocet 5/325 which is helping with the pain.  4.  Family history: -Given her extensive family history, recommended genetic testing.   Orders placed this encounter:  Orders Placed This Encounter  Procedures  . [place] LT BREAST BX W LOC DEV 1ST LESION IMG BX [place] US GUIDE     Derek Jack, MD [place] [phone]   I, Milinda Antis, am acting as a scribe for Dr. Sanda Linger.  I, Derek Jack MD, have reviewed the above documentation for accuracy and completeness, and I agree with the above.

## 2020-07-04 ENCOUNTER — Encounter (HOSPITAL_COMMUNITY): Payer: Self-pay

## 2020-07-04 NOTE — Progress Notes (Unsigned)
[facility]   Kaitlin Baker. Kaitlin Baker Female, 64 y.o., 1955/10/21 MRN:  [id number] Phone:  [phone] (H) ... PCP:  Patient, No Pcp Per Coverage:  Medicare/Medicare Part A And B Next Appt With Radiology ([facility]-US 2) 07/11/2020 at 1:00 PM  FW: Breast Biopsy Received: Today Message Details  Joneen Boers E Previous Messages  ----- Message -----  From: Randa Spike  Sent: 07/04/2020  9:17 AM EDT  To: Marcelyn Bruins, Jillyn Hidden  Subject: RE: Breast Biopsy                 Good Morning Larinda Buttery,   I just spoke w/ Dr. Earleen Newport about this patient he stated that she needs to be scheduled for this bx over at [facility] or [place] with either himself or one of his partners, he stated he has already spoke w/ the [facility] team over at the hospital and they agreed with his recommendation.   Thank you,  [place]  ----- Message -----  From: Garth Bigness D  Sent: 07/04/2020  8:48 AM EDT  To: Marcelyn Bruins, Randa Spike  Subject: Breast Biopsy                    ----- Message -----  From: Corrie Mckusick, DO  Sent: 07/04/2020  7:51 AM EDT  To: Derek Jack, MD, Jillyn Hidden, *   Ok thank you for follow up .   I will forward for US guided biopsy.   OK for US guided biopsy of left chest/breast mass, versus axillary lymph node.   Have discussed with mammo team.   Thank you  Corrie Mckusick  ----- Message -----  From: Derek Jack, MD  Sent: 07/03/2020  6:45 PM EDT  To: Corrie Mckusick, DO   The earliest mammogram/biopsy through breast center in [facility] is 07/16/2020. I would appreciate if you

## 2020-07-08 ENCOUNTER — Inpatient Hospital Stay: Payer: Medicare Other | Admitting: Genetic Counselor

## 2020-07-10 ENCOUNTER — Other Ambulatory Visit: Payer: Self-pay | Admitting: Radiology

## 2020-07-11 ENCOUNTER — Ambulatory Visit (HOSPITAL_COMMUNITY)
Admission: RE | Admit: 2020-07-11 | Discharge: 2020-07-11 | Disposition: A | Discharge status: Home / Self Care | Payer: Medicare Other | Source: Ambulatory Visit | Attending: Hematology | Admitting: Hematology

## 2020-07-11 ENCOUNTER — Other Ambulatory Visit (HOSPITAL_COMMUNITY): Payer: Self-pay | Admitting: Hematology

## 2020-07-11 ENCOUNTER — Other Ambulatory Visit: Payer: Self-pay

## 2020-07-11 DIAGNOSIS — C7951 Secondary malignant neoplasm of bone: Secondary | ICD-10-CM | POA: Diagnosis not present

## 2020-07-11 DIAGNOSIS — N6320 Unspecified lump in the left breast, unspecified quadrant: Secondary | ICD-10-CM | POA: Diagnosis present

## 2020-07-11 DIAGNOSIS — C50912 Malignant neoplasm of unspecified site of left female breast: Secondary | ICD-10-CM

## 2020-07-11 DIAGNOSIS — Z170 Estrogen receptor positive status [ER+]: Secondary | ICD-10-CM | POA: Diagnosis not present

## 2020-07-11 MED ORDER — LIDOCAINE HCL (PF) 1 % IJ SOLN
INTRAMUSCULAR | Status: AC
  Filled 2020-07-11: qty 30

## 2020-07-11 NOTE — Procedures (Signed)
Interventional Radiology Procedure:   Indications: Left breast mass  Procedure: US guided left breast biopsy  Findings: Large hard breast mass, 7 cores obtained.   Complications: None     EBL: less than 10 ml  Plan: Discharge to home.    Zelphia Glover R. Anselm Pancoast, MD  Pager: [phone]

## 2020-07-12 ENCOUNTER — Other Ambulatory Visit (HOSPITAL_COMMUNITY): Payer: Self-pay | Admitting: *Deleted

## 2020-07-12 MED ORDER — ONDANSETRON HCL 8 MG PO TABS: 8.0000 mg | ORAL_TABLET | Freq: Three times a day (TID) | ORAL | 2 refills | Status: DC | PRN

## 2020-07-12 NOTE — Telephone Encounter (Signed)
Patient called clinic stating that her pain medication is helping her pain but is making her terribly nauseous.  She reports that compazine doesn't help her pain, as she has taken it in the past.  I advised her that we would call in Zofran for nausea and for her to take it about 30 minutes before she takes the pain medication to see if that helps with the nausea side effect.  She verbalizes understanding.

## 2020-07-16 ENCOUNTER — Other Ambulatory Visit: Payer: Medicare Other

## 2020-07-16 LAB — SURGICAL PATHOLOGY

## 2020-07-17 ENCOUNTER — Other Ambulatory Visit: Payer: Self-pay

## 2020-07-17 ENCOUNTER — Inpatient Hospital Stay (HOSPITAL_COMMUNITY): Payer: Medicare Other | Attending: Hematology | Admitting: Hematology

## 2020-07-17 ENCOUNTER — Encounter (HOSPITAL_COMMUNITY): Payer: Self-pay | Admitting: Hematology

## 2020-07-17 ENCOUNTER — Other Ambulatory Visit (HOSPITAL_COMMUNITY): Payer: Self-pay

## 2020-07-17 ENCOUNTER — Encounter (HOSPITAL_COMMUNITY): Payer: Self-pay | Admitting: *Deleted

## 2020-07-17 VITALS — BP 149/88 | HR 105 | Temp 97.5°F | Resp 18 | Wt 204.8 lb

## 2020-07-17 DIAGNOSIS — Z170 Estrogen receptor positive status [ER+]: Secondary | ICD-10-CM

## 2020-07-17 DIAGNOSIS — Z79811 Long term (current) use of aromatase inhibitors: Secondary | ICD-10-CM | POA: Diagnosis not present

## 2020-07-17 DIAGNOSIS — Z79899 Other long term (current) drug therapy: Secondary | ICD-10-CM | POA: Diagnosis not present

## 2020-07-17 DIAGNOSIS — C7951 Secondary malignant neoplasm of bone: Secondary | ICD-10-CM | POA: Insufficient documentation

## 2020-07-17 DIAGNOSIS — C50912 Malignant neoplasm of unspecified site of left female breast: Secondary | ICD-10-CM

## 2020-07-17 DIAGNOSIS — C50919 Malignant neoplasm of unspecified site of unspecified female breast: Secondary | ICD-10-CM | POA: Insufficient documentation

## 2020-07-17 MED ORDER — PALBOCICLIB 100 MG PO CAPS: 100.0000 mg | ORAL_CAPSULE | Freq: Every day | ORAL | 3 refills | Status: DC

## 2020-07-17 MED ORDER — ANASTROZOLE 1 MG PO TABS: 1.0000 mg | ORAL_TABLET | Freq: Every day | ORAL | 6 refills | Status: DC

## 2020-07-17 MED ORDER — OXYCODONE-ACETAMINOPHEN 5-325 MG PO TABS: 1.0000 | ORAL_TABLET | Freq: Four times a day (QID) | ORAL | 0 refills | Status: DC | PRN

## 2020-07-17 NOTE — Progress Notes (Signed)
[place], [place] [address]   CLINIC:  Medical Oncology/Hematology  PCP:  Default, Provider, MD None None   REASON FOR VISIT:  Follow-up for metastatic left breast cancer  PRIOR THERAPY: None  NGS Results: ER/PR positive, HER-2 negative, Ki-67 60%  CURRENT THERAPY: Arimidex & Ibrance to begin  BRIEF ONCOLOGIC HISTORY:  Oncology History   No history exists.    CANCER STAGING: Cancer Staging No matching staging information was found for the patient.  INTERVAL HISTORY:  [name], a 64 y.o. female, returns for routine follow-up of her metastatic left breast cancer. Daniel was last seen on 07/01/2020.   Today she is accompanied by her daughter. She complains of fatigue and not having the drive to do her chores and ADL's. She is a generally active person who goes camping regularly, but has been really fatigued to do anything and will feel sick if she tries to be active. She reports having pain in her left arm and left lower back and she is taking Percocet TID which is well-controlled. She also reports having nausea whenever she eats anything heavy and takes Zofran every 8 hours. She takes a stool softener daily.  She will follow-up with orthopedics on 11/5. She reports having only some of her native teeth in place.   REVIEW OF SYSTEMS:  Review of Systems  Constitutional: Positive for appetite change (30%) and fatigue (25%).  Gastrointestinal: Positive for nausea and vomiting.  Musculoskeletal: Positive for back pain (6/10 L lower back and L arm).  All other systems reviewed and are negative.   PAST MEDICAL/SURGICAL HISTORY:  Past Medical History:  Diagnosis Date  . Breast cancer ([place])   . High cholesterol    Past Surgical History:  Procedure Laterality Date  . CHOLECYSTECTOMY    . neck surgery     plate placed    SOCIAL HISTORY:  Social History   Socioeconomic History  . Marital status: Married    Spouse name:  Not on file  . Number of children: 2  . Years of education: Not on file  . Highest education level: Not on file  Occupational History  . Occupation: retired  Tobacco Use  . Smoking status: Never Smoker  . Smokeless tobacco: Never Used  Vaping Use  . Vaping Use: Never used  Substance and Sexual Activity  . Alcohol use: Never  . Drug use: Never  . Sexual activity: Not Currently  Other Topics Concern  . Not on file  Social History Narrative  . Not on file   Social Determinants of Health   Financial Resource Strain: Low Risk   . Difficulty of Paying Living Expenses: Not hard at all  Food Insecurity: No Food Insecurity  . Worried About Charity fundraiser in the Last Year: Never true  . Ran Out of Food in the Last Year: Never true  Transportation Needs: No Transportation Needs  . Lack of Transportation (Medical): No  . Lack of Transportation (Non-Medical): No  Physical Activity: Inactive  . Days of Exercise per Week: 0 days  . Minutes of Exercise per Session: 0 min  Stress: No Stress Concern Present  . Feeling of Stress : Only a little  Social Connections: Moderately Integrated  . Frequency of Communication with Friends and Family: Three times a week  . Frequency of Social Gatherings with Friends and Family: Three times a week  . Attends Religious Services: 1 to 4 times per year  .  Active Member of Clubs or Organizations: No  . Attends Archivist Meetings: Never  . Marital Status: Married  Human resources officer Violence: Not At Risk  . Fear of Current or Ex-Partner: No  . Emotionally Abused: No  . Physically Abused: No  . Sexually Abused: No    FAMILY HISTORY:  Family History  Problem Relation Age of Onset  . Breast cancer Mother   . Brain cancer Father   . Breast cancer Sister   . Breast cancer Sister   . Liver cancer Brother   . Alcohol abuse Brother   . Asthma Son     CURRENT MEDICATIONS:  Current Outpatient Medications  Medication Sig Dispense Refill   . ondansetron (ZOFRAN) 8 MG tablet Take 1 tablet (8 mg total) by mouth every 8 (eight) hours as needed for nausea or vomiting. 60 tablet 2  . Oxycodone HCl 10 MG TABS Take 1 tablet (10 mg total) by mouth every 6 (six) hours as needed. (Patient not taking: Reported on 07/09/2020) 60 tablet 0  . oxyCODONE-acetaminophen (PERCOCET/ROXICET) 5-325 MG tablet Take 1 tablet by mouth 3 (three) times daily as needed for moderate pain.      No current facility-administered medications for this visit.    ALLERGIES:  Allergies  Allergen Reactions  . Codeine Nausea And Vomiting    PHYSICAL EXAM:  Performance status (ECOG): 2 - Symptomatic, <50% confined to bed  Vitals:   07/17/20 1506  BP: (!) 149/88  Pulse: (!) 105  Resp: 18  Temp: (!) 97.5 F (36.4 C)  SpO2: 96%   Wt Readings from Last 3 Encounters:  07/17/20 204 lb 12.8 oz (92.9 kg)  07/11/20 209 lb (94.8 kg)  07/01/20 209 lb 3.2 oz (94.9 kg)   Physical Exam Vitals reviewed.  Constitutional:      Appearance: Normal appearance. She is obese.  Musculoskeletal:     Comments: L arm in sling  Neurological:     General: No focal deficit present.     Mental Status: She is alert and oriented to person, place, and time.  Psychiatric:        Mood and Affect: Mood normal.        Behavior: Behavior normal.      LABORATORY DATA:  I have reviewed the labs as listed.  CBC Latest Ref Rng & Units 06/17/2020 12/07/2010 12/03/2010  WBC 4.0 - 10.5 K/uL 7.9 10.7(H) 8.7  Hemoglobin 12.0 - 15.0 g/dL 11.2(L) 14.1 14.0  Hematocrit 36 - 46 % 34.6(L) 41.2 41.2  Platelets 150 - 400 K/uL 306 310 276   CMP Latest Ref Rng & Units 06/17/2020 12/07/2010 12/03/2010  Glucose 70 - 99 mg/dL 101(H) 92 98  BUN 8 - 23 mg/dL $Remove'16 13 10  'dLuSqhn$ Creatinine 0.44 - 1.00 mg/dL 0.96 1.03 0.92  Sodium 135 - 145 mmol/L 138 133(L) 140  Potassium 3.5 - 5.1 mmol/L 3.8 4.2 4.1  Chloride 98 - 111 mmol/L 99 101 106  CO2 22 - 32 mmol/L $RemoveB'28 21 26  'foFsCZSG$ Calcium 8.9 - 10.3 mg/dL 9.5 9.1 9.2   Total Protein 6.5 - 8.1 g/dL 7.2 6.8 7.1  Total Bilirubin 0.3 - 1.2 mg/dL 0.6 0.4 0.3  Alkaline Phos 38 - 126 U/L 230(H) 91 98  AST 15 - 41 U/L 28 37 21  ALT 0 - 44 U/L 16 33 19   Surgical pathology ([id number]) on 07/11/2020: Left breast needle core biopsy: invasive mammary carcinoma, ER/PR positive, HER-2 negative, Ki-67 60%.  DIAGNOSTIC IMAGING:  I have  independently reviewed the scans and discussed with the patient. NM PET Image Initial (PI) Skull Base To Thigh  Result Date: 06/25/2020 CLINICAL DATA:  Initial treatment strategy for metastatic breast cancer with reported pathologic left humeral fracture. EXAM: NUCLEAR MEDICINE PET SKULL BASE TO THIGH TECHNIQUE: 12.0 mCi F-18 FDG was injected intravenously. Full-ring PET imaging was performed from the skull base to thigh after the radiotracer. CT data was obtained and used for attenuation correction and anatomic localization. Fasting blood glucose: 86 mg/dl COMPARISON:  05/23/2020 CT left humerus. 02/10/2004 CT abdomen/pelvis. FINDINGS: Mediastinal blood pool activity: SUV max 2.3 Liver activity: SUV max NA NECK: No hypermetabolic lymph nodes in the neck. Incidental CT findings: none CHEST: Hypermetabolic irregular 8.1 x 4.0 cm left breast mass with max SUV 4.3 (series 3/image 176) with asymmetric left breast skin thickening. Multiple enlarged hypermetabolic left axillary lymph nodes, largest 1.6 cm in the low left axilla with max SUV 4.1 (series 3/image 174). No enlarged or hypermetabolic right axillary, mediastinal or hilar lymph nodes. No hypermetabolic pulmonary findings. Incidental CT findings: Atherosclerotic nonaneurysmal thoracic aorta. No acute consolidative airspace disease, lung masses or significant pulmonary nodules. ABDOMEN/PELVIS: No abnormal hypermetabolic activity within the liver, pancreas, adrenal glands, or spleen. No hypermetabolic lymph nodes in the abdomen or pelvis. Incidental CT findings: Cholecystectomy. Small hiatal  hernia. Marked sigmoid diverticulosis. SKELETON: Extensive multifocal hypermetabolic mixed lytic and sclerotic osseous metastases throughout the axial and proximal appendicular skeleton, including the calvarium, thoracolumbar spine, bilateral ribs, sternum, sacrum, iliac crests and proximal femora and humeri. Representative proximal left femoral metaphysis lesion with max SUV 6.8. Representative midline calvarial lytic lesion near the vertex with max SUV 3.9. Representative lateral right fifth rib lesion with max SUV 5.2. Representative L5 vertebral body lesion with max SUV 7.6. Pathologic proximal left humerus metadiaphysis fracture with evidence of healing response. Incidental CT findings: none IMPRESSION: 1. Irregular hypermetabolic 8.1 x 4.0 cm left breast mass compatible with primary left breast malignancy. 2. Hypermetabolic left axillary nodal metastases. 3. Extensive multifocal hypermetabolic mixed lytic and sclerotic osseous metastatic disease throughout the axial and proximal appendicular skeleton. Pathologic proximal left humerus fracture with healing response. 4. Chronic findings include: Aortic Atherosclerosis (ICD10-I70.0). Small hiatal hernia. Marked sigmoid diverticulosis. Electronically Signed   By: Ilona Sorrel M.D.   On: 06/25/2020 09:52   [place] CORE BIOPSY (SOFT TISSUE)  Result Date: 07/11/2020 INDICATION: 64 year old with large left breast mass and evidence for metastatic disease to bones. Patient needs a tissue diagnosis. EXAM: ULTRASOUND-GUIDED CORE BIOPSY OF LEFT BREAST MASS MEDICATIONS: None. ANESTHESIA/SEDATION: None FLUOROSCOPY TIME:  None COMPLICATIONS: None immediate. PROCEDURE: Informed written consent was obtained from the patient after a thorough discussion of the procedural risks, benefits and alternatives. All questions were addressed. A timeout was performed prior to the initiation of the procedure. Patient has large palpable mass involving the left breast. Mass was easily  identified with ultrasound. 12 o'clock position of the breast was prepped with chlorhexidine and sterile field was created. Skin and soft tissues were anesthetized using 1% lidocaine. Small incision was made. Using ultrasound guidance, 17 gauge coaxial needle was directed into the hard hypoechoic mass. Core biopsies were initially obtained with an 18 gauge device but scant specimens were obtained. Therefore, the 17 gauge needle was removed and a 15 gauge needle was placed into the mass with ultrasound guidance. Additional core biopsies were obtained with a 16 gauge core biopsy needle. Larger core samples were obtained. Specimens placed in formalin. Total of 7 core biopsies were obtained.  Bandage placed over the puncture site. FINDINGS: Patient has a palpable hard mass involving the left breast. Ultrasound demonstrates a very lobulated hypoechoic mass. The lesion is very hard and it was difficult to advance biopsy needle into the lesion. IMPRESSION: Ultrasound-guided core biopsy of left breast mass. Electronically Signed   By: Markus Daft M.D.   On: 07/11/2020 15:46     ASSESSMENT:  1.  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 She did not seek medical attention. She did not have mammograms for the past 30 years. -Reported 26 pound weight loss which was intentional in the last 6 months. -Developed fracture of the proximal left humeral diaphysis when she picked up her cat. -CT humerus left without contrast on 05/23/2020 shows minimally displaced oblique fracture of the proximal left humeral diaphysis with no definitive CT evidence of underlying lesion. Nonspecific left axillary adenopathy. -Physical exam today reveals left breast mass occupying the majority of the outer quadrant with skin thickening. Could not palpate the axillary lymph node as she was unable to lift her arm. -PET scan on 06/24/2020 showed irregular  hypermetabolic 8.1 x 4.0 left breast mass compatible with malignancy with left axillary nodal metastasis.  Extensive multifocal mixed lytic and sclerotic metastatic disease throughout the axial and proximal appendicular skeleton. -Ultrasound-guided left breast biopsy on 07/11/2020 shows invasive mammary carcinoma, grade 2.  ER/PR 90% positive.  Ki-67 60%.  HER-2 2+ by IHC.  HER-2 negative by FISH. -Anastrozole and palbociclib started on 07/17/2020.  2. Social/family history: -Lives at home with husband. Never smoker. 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   PLAN:  1.  Metastatic ER positive left breast cancer to the bones: -Reviewed pathology report of the left breast biopsy dated 07/11/2020 with the patient and her daughter in detail.  We also reviewed normal prognosis of metastatic ER positive breast cancer. -Reviewed the treatment recommendations including best supportive care versus active therapy with anastrozole and palbociclib. -Discussed the regimen and side effects in detail.  Patient would like to proceed with active therapy. -I have sent a prescription for both medications.  Labs from 06/17/2020 reviewed by me shows normal calcium and LFTs. -I plan to see her back 2 weeks after starting palbociclib to check nadir counts and tolerability.  2.  Bone metastasis: -We have also discussed bone strengthening agents like denosumab to decrease skeletal related events. -We discussed the side effects of denosumab including hypocalcemia and rare chance of ONJ. -She is not seeing a dentist on regular basis.  She would like to think about it and let us know.  3. Right shoulder blade pain: -Continue Percocet 5/325 every 6 hours as needed.  We have sent in a new refill. -This is controlling her pain well.  4. Family history: -Recommend genetic testing given her extensive family history.  5.  Nausea/vomiting: -She gets  nauseous whenever she tries to eat. -May increase Zofran to every 6 hours as needed.  6.  Left humerus pathological fracture: -She has an appointment to see orthopedics this Friday.   Orders placed this encounter:  No orders of the defined types were placed in this encounter.  Total time spent is 40 minutes with more than 50% of the time spent face-to-face discussing biopsy reports, treatment plan, adverse effects, counseling and coordination of care.  Derek Jack, MD [facility] [phone]   I,  Milinda Antis, am acting as a scribe for Dr. Sanda Linger.  I, Derek Jack MD, have reviewed the above documentation for accuracy and completeness, and I agree with the above.

## 2020-07-17 NOTE — Patient Instructions (Signed)
[place] at [facility] Discharge Instructions  You were seen today by Dr. Delton Coombes. He went over your recent results; your pathology result showed breast cancer. You will be started on anti-estrogen medication called Arimidex, taken daily, and Ibrance which will be taken for 3 weeks, then 1 week off. Start taking Zofran every 6 hours to control your nausea. Dr. Delton Coombes will see you back in 3 weeks for labs and follow up.   Thank you for choosing [place] at [facility] to provide your oncology and hematology care.  To afford each patient quality time with our provider, please arrive at least 15 minutes before your scheduled appointment time.   If you have a lab appointment with the [facility] please come in thru the Main Entrance and check in at the main information desk  You need to re-schedule your appointment should you arrive 10 or more minutes late.  We strive to give you quality time with our providers, and arriving late affects you and other patients whose appointments are after yours.  Also, if you no show three or more times for appointments you may be dismissed from the clinic at the providers discretion.     Again, thank you for choosing [facility].  Our hope is that these requests will decrease the amount of time that you wait before being seen by our physicians.       _____________________________________________________________  Should you have questions after your visit to [facility], please contact our office at [phone] between the hours of 8:00 a.m. and 4:30 p.m.  Voicemails left after 4:00 p.m. will not be returned until the following business day.  For prescription refill requests, have your pharmacy contact our office and allow 72 hours.    Cancer Center Support Programs:   > Cancer Support Group  2nd Tuesday of the month 1pm-2pm, Journey Room

## 2020-07-18 ENCOUNTER — Telehealth (HOSPITAL_COMMUNITY): Payer: Self-pay | Admitting: Pharmacy Technician

## 2020-07-18 ENCOUNTER — Encounter (HOSPITAL_COMMUNITY): Payer: Self-pay

## 2020-07-18 ENCOUNTER — Telehealth (HOSPITAL_COMMUNITY): Payer: Self-pay | Admitting: Pharmacist

## 2020-07-18 ENCOUNTER — Other Ambulatory Visit (HOSPITAL_COMMUNITY): Payer: Self-pay | Admitting: Hematology

## 2020-07-18 DIAGNOSIS — C50919 Malignant neoplasm of unspecified site of unspecified female breast: Secondary | ICD-10-CM

## 2020-07-18 MED ORDER — PALBOCICLIB 100 MG PO TABS: 100.0000 mg | ORAL_TABLET | Freq: Every day | ORAL | 0 refills | Status: DC

## 2020-07-18 MED FILL — IBRANCE 100 MG TABS: 100 | 21 days supply | Qty: 21 | Fill #0

## 2020-07-18 NOTE — Progress Notes (Signed)
Patient to receive Ibrance tomorrow. Appt moved to 11/22. I have called the patient and left a VM with the updated date and time. I have asked that the patient call me back to confirm.

## 2020-07-18 NOTE — Telephone Encounter (Signed)
[name] Encounter  Patient does not have any pharmacy coverage.  We will have to apply for manufacturer assistance so patient can receive Ibrance.  [place] Patient [place] Phone [phone] Fax [phone] 07/18/2020 9:51 AM

## 2020-07-18 NOTE — Telephone Encounter (Signed)
Oral Chemotherapy Pharmacist Encounter  [facility] will deliver one month free voucher on 07/19/20. Patient will get started on 07/19/20, f/u appt moved up to 11/22.  Patient Education I spoke with patient for overview of new oral chemotherapy medication: Ibrance (palbociclib) for the treatment of metastatic breast cancer ER/PR positive, HER2 negative in conjunction with anastrozole, planned duration until disease progression or unacceptable drug toxicity.   Pt is doing well. Counseled patient on administration, dosing, side effects, monitoring, drug-food interactions, safe handling, storage, and disposal. Patient will take 1 tablet (100 mg total) by mouth daily. Take for 21 days on, 7 days off, repeat every 28 days.  Patient reports that her husband is going to pick up her anastrazole today, she know to get started when she has the medication in hand  Side effects include but not limited to: decreased wbc/hgb/plt, N/V, fatigue hair loss, hot flashes (with anastrozole).    Reviewed with patient importance of keeping a medication schedule and plan for any missed doses.  After discussion with patient one patient barriers to medication adherence identified. No prescription coverage, we are actively working to enroll the patient in manufacturer assistance to ensure she has long-term medication assess.  Kaitlin Baker voiced understanding and appreciation. All questions answered. Medication handout and calendar provided.  Provided patient with [facility] phone number. Patient knows to call the office with questions or concerns. Oral Chemotherapy Navigation Clinic will continue to follow.  Darl Pikes, PharmD, BCPS, BCOP, CPP Hematology/Oncology Clinical Pharmacist Practitioner [facility] [place] [phone]  07/18/2020 3:54 PM

## 2020-07-18 NOTE — Telephone Encounter (Signed)
Oral Oncology Pharmacist Encounter  Received new prescription for Ibrance (palbociclib) for the treatment of metastatic breast cancer ER/PR positive, HER2 negative in conjunction with anastrozole, planned duration until disease progression or unacceptable drug toxicity.  CMP from 06/17/20 assessed, no relevant lab abnormalities. Prescription dose and frequency assessed.   Current medication list in Epic reviewed, no DDIs with palbociclib identified.  Evaluated chart and no patient barriers to medication adherence identified.   Prescription has been e-scribed to the [facility] Patient does not have prescription insurance coverage, we can get her started using a one time free voucher while she proceeds onto manufacturer assistance.  Oral Oncology Clinic will continue to follow for initial counseling and start date.  Darl Pikes, PharmD, BCPS, BCOP, CPP Hematology/Oncology Clinical Pharmacist Practitioner [facility] [place] [facility] [phone]  07/18/2020 10:25 AM

## 2020-07-19 ENCOUNTER — Other Ambulatory Visit (HOSPITAL_COMMUNITY): Payer: Self-pay

## 2020-07-19 NOTE — Telephone Encounter (Signed)
[name] Encounter  I spoke with [name] on 07/18/20 to set up delivery of Ibrance (free voucher fill).  Address verified for shipment.  [name] will be filled through [facility] and mailed 07/18/20 for delivery 07/19/20.    Patient is going to stop by [facility] to sign application for assistance in the next few days.  [place] Patient Kaitlin Baker Phone [phone] Fax [phone] 07/19/2020 1:34 PM

## 2020-07-25 ENCOUNTER — Encounter (HOSPITAL_COMMUNITY): Payer: Self-pay | Admitting: *Deleted

## 2020-07-26 ENCOUNTER — Telehealth (HOSPITAL_COMMUNITY): Payer: Self-pay | Admitting: Pharmacy Technician

## 2020-07-26 NOTE — Telephone Encounter (Signed)
[name] Encounter  Patient stopped by [facility] on 21/94/71 to complete application for [employer] in an effort to reduce patient's out of pocket expense for Ibrance to $0.    Application completed and faxed to [phone].   [employer] patient assistance phone number for follow up is [phone].   This encounter will be updated until final determination.   [place] Patient [place] Phone [phone] Fax [phone] 07/26/2020 8:51 AM

## 2020-08-02 NOTE — Telephone Encounter (Signed)
Called to check the status of patients application. It is still being processed.  Rep states that process usually takes about 1-2 weeks, so hopefully we will know something next week.    [place] Patient [place] Phone [phone] Fax [phone] 08/02/2020 9:27 AM

## 2020-08-05 ENCOUNTER — Other Ambulatory Visit (HOSPITAL_COMMUNITY): Payer: Self-pay | Admitting: *Deleted

## 2020-08-05 ENCOUNTER — Inpatient Hospital Stay (HOSPITAL_COMMUNITY): Payer: Medicare Other

## 2020-08-05 ENCOUNTER — Inpatient Hospital Stay (HOSPITAL_BASED_OUTPATIENT_CLINIC_OR_DEPARTMENT_OTHER): Payer: Medicare Other | Admitting: Hematology

## 2020-08-05 ENCOUNTER — Other Ambulatory Visit: Payer: Self-pay

## 2020-08-05 VITALS — BP 133/81 | HR 92 | Temp 97.2°F | Resp 18 | Wt 206.7 lb

## 2020-08-05 DIAGNOSIS — C50919 Malignant neoplasm of unspecified site of unspecified female breast: Secondary | ICD-10-CM

## 2020-08-05 DIAGNOSIS — C50912 Malignant neoplasm of unspecified site of left female breast: Secondary | ICD-10-CM

## 2020-08-05 DIAGNOSIS — C7951 Secondary malignant neoplasm of bone: Secondary | ICD-10-CM

## 2020-08-05 DIAGNOSIS — Z170 Estrogen receptor positive status [ER+]: Secondary | ICD-10-CM

## 2020-08-05 LAB — COMPREHENSIVE METABOLIC PANEL
ALT: 16 U/L (ref 0–44)
AST: 20 U/L (ref 15–41)
Albumin: 4 g/dL (ref 3.5–5.0)
Alkaline Phosphatase: 220 U/L — ABNORMAL HIGH (ref 38–126)
Anion gap: 10 (ref 5–15)
BUN: 11 mg/dL (ref 8–23)
CO2: 27 mmol/L (ref 22–32)
Calcium: 9 mg/dL (ref 8.9–10.3)
Chloride: 100 mmol/L (ref 98–111)
Creatinine, Ser: 1.1 mg/dL — ABNORMAL HIGH (ref 0.44–1.00)
GFR, Estimated: 55 mL/min — ABNORMAL LOW (ref >60)
Glucose, Bld: 101 mg/dL — ABNORMAL HIGH (ref 70–99)
Potassium: 3.6 mmol/L (ref 3.5–5.1)
Sodium: 137 mmol/L (ref 135–145)
Total Bilirubin: 0.5 mg/dL (ref 0.3–1.2)
Total Protein: 7.8 g/dL (ref 6.5–8.1)

## 2020-08-05 LAB — CBC WITH DIFFERENTIAL/PLATELET
Band Neutrophils: 1 %
Basophils Absolute: 0 10*3/uL (ref 0.0–0.1)
Basophils Relative: 1 %
Eosinophils Absolute: 0.1 10*3/uL (ref 0.0–0.5)
Eosinophils Relative: 4 %
HCT: 36.9 % (ref 36.0–46.0)
Hemoglobin: 11.6 g/dL — ABNORMAL LOW (ref 12.0–15.0)
Lymphocytes Relative: 50 %
Lymphs Abs: 1.5 10*3/uL (ref 0.7–4.0)
MCH: 28.4 pg (ref 26.0–34.0)
MCHC: 31.4 g/dL (ref 30.0–36.0)
MCV: 90.4 fL (ref 80.0–100.0)
Monocytes Absolute: 0 10*3/uL — ABNORMAL LOW (ref 0.1–1.0)
Monocytes Relative: 1 %
Neutro Abs: 1.3 10*3/uL — ABNORMAL LOW (ref 1.7–7.7)
Neutrophils Relative %: 43 %
Platelets: 184 10*3/uL (ref 150–400)
RBC: 4.08 MIL/uL (ref 3.87–5.11)
RDW: 18 % — ABNORMAL HIGH (ref 11.5–15.5)
WBC: 2.9 10*3/uL — ABNORMAL LOW (ref 4.0–10.5)
nRBC: 0 % (ref 0.0–0.2)

## 2020-08-05 MED ORDER — OXYCODONE-ACETAMINOPHEN 5-325 MG PO TABS: 1.0000 | ORAL_TABLET | Freq: Four times a day (QID) | ORAL | 0 refills | Status: DC | PRN

## 2020-08-05 NOTE — Progress Notes (Signed)
[place], [place] [id number]   CLINIC:  Medical Oncology/Hematology  PCP:  Default, Provider, MD None None   REASON FOR VISIT:  Follow-up for metastatic left breast cancer  PRIOR THERAPY: None  NGS Results: ER/PR positive, HER-2 negative, Ki-67 60%  CURRENT THERAPY: Arimidex daily & Ibrance 100 mg 3 weeks on, 1 week off  BRIEF ONCOLOGIC HISTORY:  Oncology History   No history exists.    CANCER STAGING: Cancer Staging No matching staging information was found for the patient.  INTERVAL HISTORY:  Kaitlin Baker, a 64 y.o. female, returns for routine follow-up of her metastatic left breast cancer. Kaitlin Baker was last seen on 07/17/2020.   Today she reports feeling well. She started taking the Arimidex and Ibrance on 11/5 and reports that started having hot flashes on 11/20. Her arm sling was removed and reports that her ortho told her that new bone is forming, but she continues having some left arm soreness and swelling. The ache in her left scapula, arm and leg is well controlled with Percocet every 6 hours and reports that her pain has improved slightly since starting Arimidex and Ibrance. She continues reporting abnormal taste, but has not taken any Zofran for nausea for 1.5 weeks. She denies having excessive fatigue.  She is hesitant about getting Delton See due to her dental issues.   REVIEW OF SYSTEMS:  Review of Systems  Constitutional: Positive for fatigue (75%). Negative for appetite change (abnormal taste).  Gastrointestinal: Negative for nausea.  Endocrine: Positive for hot flashes.  Musculoskeletal: Positive for arthralgias (L arm soreness & swelling).    PAST MEDICAL/SURGICAL HISTORY:  Past Medical History:  Diagnosis Date  . Breast cancer ([place])   . High cholesterol    Past Surgical History:  Procedure Laterality Date  . CHOLECYSTECTOMY    . neck surgery     plate placed    SOCIAL HISTORY:  Social History    Socioeconomic History  . Marital status: Married    Spouse name: Not on file  . Number of children: 2  . Years of education: Not on file  . Highest education level: Not on file  Occupational History  . Occupation: retired  Tobacco Use  . Smoking status: Never Smoker  . Smokeless tobacco: Never Used  Vaping Use  . Vaping Use: Never used  Substance and Sexual Activity  . Alcohol use: Never  . Drug use: Never  . Sexual activity: Not Currently  Other Topics Concern  . Not on file  Social History Narrative  . Not on file   Social Determinants of Health   Financial Resource Strain: Low Risk   . Difficulty of Paying Living Expenses: Not hard at all  Food Insecurity: No Food Insecurity  . Worried About Charity fundraiser in the Last Year: Never true  . Ran Out of Food in the Last Year: Never true  Transportation Needs: No Transportation Needs  . Lack of Transportation (Medical): No  . Lack of Transportation (Non-Medical): No  Physical Activity: Inactive  . Days of Exercise per Week: 0 days  . Minutes of Exercise per Session: 0 min  Stress: No Stress Concern Present  . Feeling of Stress : Not at all  Social Connections: Moderately Integrated  . Frequency of Communication with Friends and Family: Three times a week  . Frequency of Social Gatherings with Friends and Family: Three times a week  . Attends Religious Services: 1 to 4 times  per year  . Active Member of Clubs or Organizations: No  . Attends Archivist Meetings: Never  . Marital Status: Married  Human resources officer Violence: Not At Risk  . Fear of Current or Ex-Partner: No  . Emotionally Abused: No  . Physically Abused: No  . Sexually Abused: No    FAMILY HISTORY:  Family History  Problem Relation Age of Onset  . Breast cancer Mother   . Brain cancer Father   . Breast cancer Sister   . Breast cancer Sister   . Liver cancer Brother   . Alcohol abuse Brother   . Asthma Son     CURRENT  MEDICATIONS:  Current Outpatient Medications  Medication Sig Dispense Refill  . anastrozole (ARIMIDEX) 1 MG tablet Take 1 tablet (1 mg total) by mouth daily. 30 tablet 6  . oxyCODONE-acetaminophen (PERCOCET/ROXICET) 5-325 MG tablet Take 1 tablet by mouth every 6 (six) hours as needed for moderate pain. 60 tablet 0  . palbociclib (IBRANCE) 100 MG tablet Take 1 tablet (100 mg total) by mouth daily. Take for 21 days on, 7 days off, repeat every 28 days. 21 tablet 0  . ondansetron (ZOFRAN) 8 MG tablet Take 1 tablet (8 mg total) by mouth every 8 (eight) hours as needed for nausea or vomiting. (Patient not taking: Reported on 08/05/2020) 60 tablet 2   No current facility-administered medications for this visit.    ALLERGIES:  Allergies  Allergen Reactions  . Codeine Nausea And Vomiting    PHYSICAL EXAM:  Performance status (ECOG): 2 - Symptomatic, <50% confined to bed  Vitals:   08/05/20 0920  BP: 133/81  Pulse: 92  Resp: 18  Temp: (!) 97.2 F (36.2 C)   Wt Readings from Last 3 Encounters:  08/05/20 206 lb 11.2 oz (93.8 kg)  07/17/20 204 lb 12.8 oz (92.9 kg)  07/11/20 209 lb (94.8 kg)   Physical Exam Vitals reviewed.  Constitutional:      Appearance: Normal appearance. She is obese.  Cardiovascular:     Rate and Rhythm: Normal rate and regular rhythm.     Pulses: Normal pulses.     Heart sounds: Normal heart sounds.  Pulmonary:     Effort: Pulmonary effort is normal.     Breath sounds: Normal breath sounds.  Musculoskeletal:        General: Swelling (L hand swollen) present.  Neurological:     General: No focal deficit present.     Mental Status: She is alert and oriented to person, place, and time.  Psychiatric:        Mood and Affect: Mood normal.        Behavior: Behavior normal.      LABORATORY DATA:  I have reviewed the labs as listed.  CBC Latest Ref Rng & Units 08/05/2020 06/17/2020 12/07/2010  WBC 4.0 - 10.5 K/uL 2.9(L) 7.9 10.7(H)  Hemoglobin 12.0 - 15.0  g/dL 11.6(L) 11.2(L) 14.1  Hematocrit 36 - 46 % 36.9 34.6(L) 41.2  Platelets 150 - 400 K/uL 184 306 310   CMP Latest Ref Rng & Units 08/05/2020 06/17/2020 12/07/2010  Glucose 70 - 99 mg/dL 101(H) 101(H) 92  BUN 8 - 23 mg/dL _0 Creatinine 0.44 - 1.00 mg/dL 1.10(H) 0.96 1.03  Sodium 135 - 145 mmol/L 137 138 133(L)  Potassium 3.5 - 5.1 mmol/L 3.6 3.8 4.2  Chloride 98 - 111 mmol/L 100 99 101  CO2 22 - 32 mmol/L _1 Calcium 8.9 - 10.3  mg/dL 9.0 9.5 9.1  Total Protein 6.5 - 8.1 g/dL 7.8 7.2 6.8  Total Bilirubin 0.3 - 1.2 mg/dL 0.5 0.6 0.4  Alkaline Phos 38 - 126 U/L 220(H) 230(H) 91  AST 15 - 41 U/L 20 28 37  ALT 0 - 44 U/L 16 16 33    DIAGNOSTIC IMAGING:  I have independently reviewed the scans and discussed with the patient. [place] CORE BIOPSY (SOFT TISSUE)  Result Date: 07/11/2020 INDICATION: 64 year old with large left breast mass and evidence for metastatic disease to bones. Patient needs a tissue diagnosis. EXAM: ULTRASOUND-GUIDED CORE BIOPSY OF LEFT BREAST MASS MEDICATIONS: None. ANESTHESIA/SEDATION: None FLUOROSCOPY TIME:  None COMPLICATIONS: None immediate. PROCEDURE: Informed written consent was obtained from the patient after a thorough discussion of the procedural risks, benefits and alternatives. All questions were addressed. A timeout was performed prior to the initiation of the procedure. Patient has large palpable mass involving the left breast. Mass was easily identified with ultrasound. 12 o'clock position of the breast was prepped with chlorhexidine and sterile field was created. Skin and soft tissues were anesthetized using 1% lidocaine. Small incision was made. Using ultrasound guidance, 17 gauge coaxial needle was directed into the hard hypoechoic mass. Core biopsies were initially obtained with an 18 gauge device but scant specimens were obtained. Therefore, the 17 gauge needle was removed and a 15 gauge needle was placed into the mass with ultrasound guidance.  Additional core biopsies were obtained with a 16 gauge core biopsy needle. Larger core samples were obtained. Specimens placed in formalin. Total of 7 core biopsies were obtained. Bandage placed over the puncture site. FINDINGS: Patient has a palpable hard mass involving the left breast. Ultrasound demonstrates a very lobulated hypoechoic mass. The lesion is very hard and it was difficult to advance biopsy needle into the lesion. IMPRESSION: Ultrasound-guided core biopsy of left breast mass. Electronically Signed   By: Markus Daft M.D.   On: 07/11/2020 15:46     ASSESSMENT:  1.  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 She did not seek medical attention. She did not have mammograms for the past 30 years. -Reported 26 pound weight loss which was intentional in the last 6 months. -Developed fracture of the proximal left humeral diaphysis when she picked up her cat. -CT humerus left without contrast on 05/23/2020 shows minimally displaced oblique fracture of the proximal left humeral diaphysis with no definitive CT evidence of underlying lesion. Nonspecific left axillary adenopathy. -Physical exam today reveals left breast mass occupying the majority of the outer quadrant with skin thickening. Could not palpate the axillary lymph node as she was unable to lift her arm. -PET scan on 06/24/2020 showed irregular hypermetabolic 8.1 x 4.0 left breast mass compatible with malignancy with left axillary nodal metastasis. Extensive multifocal mixed lytic and sclerotic metastatic disease throughout the axial and proximal appendicular skeleton. -Ultrasound-guided left breast biopsy on 07/11/2020 shows invasive mammary carcinoma, grade 2.  ER/PR 90% positive.  Ki-67 60%.  HER-2 2+ by IHC.  HER-2 negative by FISH. -Anastrozole and palbociclib started on 07/19/2020.  2. Social/family history: -Lives at home with husband. Never  smoker. 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   PLAN:  1.  Metastatic ER positive left breast cancer to the bones: -She felt better after starting anastrozole.  She is having some hot flashes. -Reviewed blood work today.  White count is low at 2.9 with ANC of 1.3.  Platelet count and hemoglobin are normal. -LFTs show improvement in alk phos. -RTC 08/16/2020 for CBC.  If ANC more than 1000, she may proceed with cycle 2 of Ibrance 100 mg 3 weeks on 1 week off. -I will see her back in 5 weeks for follow-up.  2.  Bone metastasis: -She will see dentist prior to starting denosumab.  We talked about side effects in detail.  3. Right shoulder blade pain: -Continue Percocet 5/325 every 6 hours as needed.  We have sent a refill.  4. Family history: -Genetic testing was recommended based on extensive family history.  5.  Nausea/vomiting: -Continue Zofran every 6 hours as needed.  6.  Left humerus pathological fracture: -Follow-up with orthopedics.   Orders placed this encounter:  Orders Placed This Encounter  Procedures  . CBC with Differential/Platelet  . Comprehensive metabolic panel     Derek Jack, MD [place] [phone]   I, Milinda Antis, am acting as a scribe for Dr. Sanda Linger.  I, Derek Jack MD, have reviewed the above documentation for accuracy and completeness, and I agree with the above.

## 2020-08-05 NOTE — Patient Instructions (Signed)
[place] at [facility] Discharge Instructions  You were seen today by Dr. Delton Coombes. He went over your recent results. You will be referred to a geneticist for further genetic analysis. Follow up with your dentist to see if your teeth are in good health to begin monthly Xgeva injections for your bones. Start spreading the Percocet to every 8 hours as tolerated for your pain. Dr. Delton Coombes will see you back in 5 weeks for labs and follow up.   Thank you for choosing [place] at [facility] to provide your oncology and hematology care.  To afford each patient quality time with our provider, please arrive at least 15 minutes before your scheduled appointment time.   If you have a lab appointment with the [facility] please come in thru the Main Entrance and check in at the main information desk  You need to re-schedule your appointment should you arrive 10 or more minutes late.  We strive to give you quality time with our providers, and arriving late affects you and other patients whose appointments are after yours.  Also, if you no show three or more times for appointments you may be dismissed from the clinic at the providers discretion.     Again, thank you for choosing [facility].  Our hope is that these requests will decrease the amount of time that you wait before being seen by our physicians.       _____________________________________________________________  Should you have questions after your visit to [facility], please contact our office at [phone] between the hours of 8:00 a.m. and 4:30 p.m.  Voicemails left after 4:00 p.m. will not be returned until the following business day.  For prescription refill requests, have your pharmacy contact our office and allow 72 hours.    Cancer Center Support Programs:   > Cancer Support Group  2nd Tuesday of the month 1pm-2pm, Journey Room

## 2020-08-05 NOTE — Progress Notes (Signed)
Patient is taking anastrozole and Ibrance as prescribed. She states that she is having some hot flashes.

## 2020-08-06 ENCOUNTER — Encounter (HOSPITAL_COMMUNITY): Payer: Medicare Other

## 2020-08-09 ENCOUNTER — Other Ambulatory Visit (HOSPITAL_COMMUNITY): Payer: Self-pay | Admitting: Hematology

## 2020-08-09 DIAGNOSIS — C50919 Malignant neoplasm of unspecified site of unspecified female breast: Secondary | ICD-10-CM

## 2020-08-12 ENCOUNTER — Other Ambulatory Visit (HOSPITAL_COMMUNITY): Payer: Self-pay | Admitting: Surgery

## 2020-08-12 DIAGNOSIS — C50919 Malignant neoplasm of unspecified site of unspecified female breast: Secondary | ICD-10-CM

## 2020-08-12 MED ORDER — PALBOCICLIB 100 MG PO TABS: 100.0000 mg | ORAL_TABLET | Freq: Every day | ORAL | 0 refills | Status: DC

## 2020-08-13 ENCOUNTER — Ambulatory Visit (HOSPITAL_COMMUNITY): Payer: Medicare Other | Admitting: Hematology

## 2020-08-13 ENCOUNTER — Other Ambulatory Visit (HOSPITAL_COMMUNITY): Payer: Medicare Other

## 2020-08-13 NOTE — Telephone Encounter (Signed)
[name] Encounter  Received notification from [place] Patient Assistance program that patient has been successfully enrolled into their program to receive Ibrance from the manufacturer at $0 out of pocket until 09/13/20.    I called and spoke with patient.  She knows we will have to re-apply.   Specialty Pharmacy that will dispense medication is [facility].  Patient knows to call the office with questions or concerns.   Oral Oncology Clinic will continue to follow.  [facility] Patient [place] Phone [phone] Fax [phone] 08/13/2020 11:39 AM

## 2020-08-13 NOTE — Telephone Encounter (Signed)
Called to check the status of application and rep stated that it is still processing.  I asked if there was an estimated time of determination since it has been over 2 weeks.  Rep did not have an answer and said I could continue to call to check on the status.   I called our [employer], Abigail Butts, and let her know the issue we are having with the assistance program to see if she could assist.  She said she would reach out and try to expedite her application and will be in contact in the next 24-48 hours with an update.  [facility] [place] Phone [phone] Fax [phone] 08/13/2020 10:56 AM

## 2020-08-15 ENCOUNTER — Other Ambulatory Visit: Payer: Self-pay

## 2020-08-15 ENCOUNTER — Inpatient Hospital Stay (HOSPITAL_COMMUNITY): Payer: Medicare Other | Attending: Hematology

## 2020-08-15 DIAGNOSIS — C50919 Malignant neoplasm of unspecified site of unspecified female breast: Secondary | ICD-10-CM

## 2020-08-15 DIAGNOSIS — C50912 Malignant neoplasm of unspecified site of left female breast: Secondary | ICD-10-CM | POA: Insufficient documentation

## 2020-08-15 DIAGNOSIS — Z170 Estrogen receptor positive status [ER+]: Secondary | ICD-10-CM | POA: Diagnosis not present

## 2020-08-15 DIAGNOSIS — C7951 Secondary malignant neoplasm of bone: Secondary | ICD-10-CM | POA: Insufficient documentation

## 2020-08-15 DIAGNOSIS — Z79811 Long term (current) use of aromatase inhibitors: Secondary | ICD-10-CM | POA: Insufficient documentation

## 2020-08-15 LAB — CBC WITH DIFFERENTIAL/PLATELET
Abs Immature Granulocytes: 0.02 10*3/uL (ref 0.00–0.07)
Basophils Absolute: 0.1 10*3/uL (ref 0.0–0.1)
Basophils Relative: 2 %
Eosinophils Absolute: 0.1 10*3/uL (ref 0.0–0.5)
Eosinophils Relative: 2 %
HCT: 34.5 % — ABNORMAL LOW (ref 36.0–46.0)
Hemoglobin: 10.9 g/dL — ABNORMAL LOW (ref 12.0–15.0)
Immature Granulocytes: 1 %
Lymphocytes Relative: 47 %
Lymphs Abs: 1.6 10*3/uL (ref 0.7–4.0)
MCH: 28.8 pg (ref 26.0–34.0)
MCHC: 31.6 g/dL (ref 30.0–36.0)
MCV: 91 fL (ref 80.0–100.0)
Monocytes Absolute: 0.4 10*3/uL (ref 0.1–1.0)
Monocytes Relative: 12 %
Neutro Abs: 1.2 10*3/uL — ABNORMAL LOW (ref 1.7–7.7)
Neutrophils Relative %: 36 %
Platelets: 243 10*3/uL (ref 150–400)
RBC: 3.79 MIL/uL — ABNORMAL LOW (ref 3.87–5.11)
RDW: 19.9 % — ABNORMAL HIGH (ref 11.5–15.5)
WBC: 3.4 10*3/uL — ABNORMAL LOW (ref 4.0–10.5)
nRBC: 0 % (ref 0.0–0.2)

## 2020-08-16 ENCOUNTER — Encounter (HOSPITAL_COMMUNITY): Payer: Self-pay | Admitting: *Deleted

## 2020-08-16 NOTE — Progress Notes (Signed)
Per Dr. Delton Coombes, patient can resume her Ibrance. Her labs have improved and ANC is 1.4.  Patient verbalizes understanding.

## 2020-08-28 ENCOUNTER — Encounter: Payer: Self-pay | Admitting: Licensed Clinical Social Worker

## 2020-08-28 ENCOUNTER — Inpatient Hospital Stay: Payer: Medicare Other | Attending: Genetic Counselor | Admitting: Licensed Clinical Social Worker

## 2020-08-28 DIAGNOSIS — Z803 Family history of malignant neoplasm of breast: Secondary | ICD-10-CM

## 2020-08-28 DIAGNOSIS — C50919 Malignant neoplasm of unspecified site of unspecified female breast: Secondary | ICD-10-CM

## 2020-08-28 NOTE — Progress Notes (Signed)
REFERRING PROVIDER: Derek Jack, MD [address] [place],  [place] [address]  PRIMARY PROVIDER:  Default, Provider, MD  PRIMARY REASON FOR VISIT:  1. Metastatic breast cancer ([place])   2. Family history of breast cancer      HISTORY OF PRESENT ILLNESS:   Kaitlin Baker, a 64 y.o. female, was seen for a [facility] cancer genetics consultation at the request of Dr. Delton Coombes due to a personal and family history of cancer.  Kaitlin Baker presents to clinic today to discuss the possibility of a hereditary predisposition to cancer, genetic testing, and to further clarify her future cancer risks, as well as potential cancer risks for family members.   In 2021, at the age of 79, Kaitlin Baker was diagnosed with metastatic breast cancer, ER/PR+, Her2-. This is currently being treated with Ibrance and anastrazole.    CANCER HISTORY:  Oncology History   No history exists.     RISK FACTORS:  Menarche was at age 29.  First live birth at age 22.  OCP use for approximately 1 years.  Ovaries intact: yes.  Hysterectomy: no.  Menopausal status: postmenopausal.  HRT use: 0 years. Colonoscopy: no; not examined.   Past Medical History:  Diagnosis Date  . Breast cancer ([place])   . Family history of breast cancer   . High cholesterol     Past Surgical History:  Procedure Laterality Date  . CHOLECYSTECTOMY    . neck surgery     plate placed    Social History   Socioeconomic History  . Marital status: Married    Spouse name: Not on file  . Number of children: 2  . Years of education: Not on file  . Highest education level: Not on file  Occupational History  . Occupation: retired  Tobacco Use  . Smoking status: Never Smoker  . Smokeless tobacco: Never Used  Vaping Use  . Vaping Use: Never used  Substance and Sexual Activity  . Alcohol use: Never  . Drug use: Never  . Sexual activity: Not Currently  Other Topics Concern  . Not on file  Social History Narrative  . Not on file    Social Determinants of Health   Financial Resource Strain: Low Risk   . Difficulty of Paying Living Expenses: Not hard at all  Food Insecurity: No Food Insecurity  . Worried About Charity fundraiser in the Last Year: Never true  . Ran Out of Food in the Last Year: Never true  Transportation Needs: No Transportation Needs  . Lack of Transportation (Medical): No  . Lack of Transportation (Non-Medical): No  Physical Activity: Inactive  . Days of Exercise per Week: 0 days  . Minutes of Exercise per Session: 0 min  Stress: No Stress Concern Present  . Feeling of Stress : Not at all  Social Connections: Moderately Integrated  . Frequency of Communication with Friends and Family: Three times a week  . Frequency of Social Gatherings with Friends and Family: Three times a week  . Attends Religious Services: 1 to 4 times per year  . Active Member of Clubs or Organizations: No  . Attends Archivist Meetings: Never  . Marital Status: Married     FAMILY HISTORY:  We obtained a detailed, 4-generation family history.  Significant diagnoses are listed below: Family History  Adopted: Yes  Problem Relation Age of Onset  . Breast cancer Mother        dx 68s  . Brain cancer Father  vs brain tumor, d. 46  . Breast cancer Sister        dx 84  . Breast cancer Sister        dx 24s  . Liver cancer Brother   . Alcohol abuse Brother   . Asthma Son    Kaitlin Baker has a son, 22 and daughter, 46. She was adopted but does have some information about her biological family. She had 10 biological siblings. Two of her sisters died of breast cancer, they were both diagnosed in their 51s. Her mother also died of breast cancer at 60. Her brother had liver cancer. Her father died of a brain tumor at 52. No other known cancers in the family.  Kaitlin Baker is unaware of previous family history of genetic testing for hereditary cancer risks. Patient's maternal ancestors are of unknown descent, and  paternal ancestors are of unknown descent. There is no reported Ashkenazi Jewish ancestry. There is no known consanguinity.   GENETIC COUNSELING ASSESSMENT: Kaitlin Baker is a 64 y.o. female with a personal and family history of breast cancer which is somewhat suggestive of a hereditary cancer syndrome and predisposition to cancer. We, therefore, discussed and recommended the following at today's visit.   DISCUSSION: We discussed that approximately 5-10% of breast cancer is hereditary  Most cases of hereditary breast cancer are associated with BRCA1/BRCA2 genes, although there are other genes associated with hereditary breast cancer as well.  We discussed that testing is beneficial for several reasons including knowing if certain targeted treatments may be appropriate,  knowing about other cancer risks, identifying potential screening and risk-reduction options that may be appropriate, and to understand if other family members could be at risk for cancer and allow them to undergo genetic testing.   We reviewed the characteristics, features and inheritance patterns of hereditary cancer syndromes. We also discussed genetic testing, including the appropriate family members to test, the process of testing, insurance coverage and turn-around-time for results. We discussed the implications of a negative, positive and/or variant of uncertain significant result. We recommended Kaitlin Baker pursue genetic testing for the [facility] Multi-Cancer Panel + RNA gene panel.   The Multi-Cancer Panel offered by [facility] includes sequencing and/or deletion duplication testing of the following 84 genes: AIP, ALK, APC, ATM, AXIN2,BAP1,  BARD1, BLM, BMPR1A, BRCA1, BRCA2, BRIP1, CASR, CDC73, CDH1, CDK4, CDKN1B, CDKN1C, CDKN2A (p14ARF), CDKN2A (p16INK4a), CEBPA, CHEK2, CTNNA1, DICER1, DIS3L2, EGFR (c.2369C>T, p.Thr790Met variant only), EPCAM (Deletion/duplication testing only), FH, FLCN, GATA2, GPC3, GREM1 (Promoter region  deletion/duplication testing only), HOXB13 (c.251G>A, p.Gly84Glu), HRAS, KIT, MAX, MEN1, MET, MITF (c.952G>A, p.Glu318Lys variant only), MLH1, MSH2, MSH3, MSH6, MUTYH, NBN, NF1, NF2, NTHL1, PALB2, PDGFRA, PHOX2B, PMS2, POLD1, POLE, POT1, PRKAR1A, PTCH1, PTEN, RAD50, RAD51C, RAD51D, RB1, RECQL4, RET, RUNX1, SDHAF2, SDHA (sequence changes only), SDHB, SDHC, SDHD, SMAD4, SMARCA4, SMARCB1, SMARCE1, STK11, SUFU, TERC, TERT, TMEM127, TP53, TSC1, TSC2, VHL, WRN and WT1.   Based on Kaitlin Baker's personal and family history of cancer, she meets medical criteria for genetic testing. Despite that she meets criteria, she may still have an out of pocket cost.   PLAN: After considering the risks, benefits, and limitations, Kaitlin Baker provided informed consent to pursue genetic testing. The sample will be drawn on 12/27 at Kaitlin Baker's next lab appointment and the blood sample will be sent to [facility] for analysis of the Multi-Cancer Panel. Results should be available within approximately 2-3 weeks' time, at which point they will be disclosed by telephone to Kaitlin Baker, as  will any additional recommendations warranted by these results. Kaitlin Baker will receive a summary of her genetic counseling visit and a copy of her results once available. This information will also be available in Epic.   Kaitlin Baker questions were answered to her satisfaction today. Our contact information was provided should additional questions or concerns arise. Thank you for the referral and allowing [place] to share in the care of your patient.   Faith Rogue, MS, [facility] Genetic Counselor [email or web address] Phone: [phone]  The patient was seen for a total of 25 minutes in face-to-face genetic counseling.  Dr. Grayland Ormond was available for discussion regarding this case.   _______________________________________________________________________ For Office Staff:  Number of people involved in session: 1 Was an Intern/ student  involved with case: no

## 2020-08-30 ENCOUNTER — Other Ambulatory Visit (HOSPITAL_COMMUNITY): Payer: Self-pay | Admitting: *Deleted

## 2020-08-30 MED ORDER — OXYCODONE-ACETAMINOPHEN 5-325 MG PO TABS
1.0000 | ORAL_TABLET | Freq: Four times a day (QID) | ORAL | 0 refills | Status: DC | PRN
Start: 2020-08-30 — End: 2020-10-03

## 2020-09-09 ENCOUNTER — Inpatient Hospital Stay (HOSPITAL_COMMUNITY): Payer: Medicare Other

## 2020-09-09 ENCOUNTER — Other Ambulatory Visit: Payer: Self-pay

## 2020-09-09 ENCOUNTER — Inpatient Hospital Stay (HOSPITAL_BASED_OUTPATIENT_CLINIC_OR_DEPARTMENT_OTHER): Payer: Medicare Other | Admitting: Hematology

## 2020-09-09 VITALS — BP 142/67 | HR 77 | Temp 96.9°F | Resp 18 | Wt 204.0 lb

## 2020-09-09 DIAGNOSIS — C50919 Malignant neoplasm of unspecified site of unspecified female breast: Secondary | ICD-10-CM

## 2020-09-09 DIAGNOSIS — Z170 Estrogen receptor positive status [ER+]: Secondary | ICD-10-CM

## 2020-09-09 DIAGNOSIS — C50912 Malignant neoplasm of unspecified site of left female breast: Secondary | ICD-10-CM | POA: Diagnosis not present

## 2020-09-09 LAB — CBC WITH DIFFERENTIAL/PLATELET
Abs Immature Granulocytes: 0 10*3/uL (ref 0.00–0.07)
Basophils Absolute: 0 10*3/uL (ref 0.0–0.1)
Basophils Relative: 1 %
Eosinophils Absolute: 0 10*3/uL (ref 0.0–0.5)
Eosinophils Relative: 1 %
HCT: 35.9 % — ABNORMAL LOW (ref 36.0–46.0)
Hemoglobin: 11.7 g/dL — ABNORMAL LOW (ref 12.0–15.0)
Immature Granulocytes: 0 %
Lymphocytes Relative: 54 %
Lymphs Abs: 1.6 10*3/uL (ref 0.7–4.0)
MCH: 30.5 pg (ref 26.0–34.0)
MCHC: 32.6 g/dL (ref 30.0–36.0)
MCV: 93.7 fL (ref 80.0–100.0)
Monocytes Absolute: 0.2 10*3/uL (ref 0.1–1.0)
Monocytes Relative: 8 %
Neutro Abs: 1.1 10*3/uL — ABNORMAL LOW (ref 1.7–7.7)
Neutrophils Relative %: 36 %
Platelets: 184 10*3/uL (ref 150–400)
RBC: 3.83 MIL/uL — ABNORMAL LOW (ref 3.87–5.11)
RDW: 20.1 % — ABNORMAL HIGH (ref 11.5–15.5)
WBC: 3 10*3/uL — ABNORMAL LOW (ref 4.0–10.5)
nRBC: 0 % (ref 0.0–0.2)

## 2020-09-09 LAB — COMPREHENSIVE METABOLIC PANEL
ALT: 9 U/L (ref 0–44)
AST: 12 U/L — ABNORMAL LOW (ref 15–41)
Albumin: 4 g/dL (ref 3.5–5.0)
Alkaline Phosphatase: 166 U/L — ABNORMAL HIGH (ref 38–126)
Anion gap: 8 (ref 5–15)
BUN: 11 mg/dL (ref 8–23)
CO2: 28 mmol/L (ref 22–32)
Calcium: 8.9 mg/dL (ref 8.9–10.3)
Chloride: 102 mmol/L (ref 98–111)
Creatinine, Ser: 0.82 mg/dL (ref 0.44–1.00)
GFR, Estimated: >60 mL/min (ref >60)
Glucose, Bld: 104 mg/dL — ABNORMAL HIGH (ref 70–99)
Potassium: 3.7 mmol/L (ref 3.5–5.1)
Sodium: 138 mmol/L (ref 135–145)
Total Bilirubin: 0.2 mg/dL — ABNORMAL LOW (ref 0.3–1.2)
Total Protein: 7.3 g/dL (ref 6.5–8.1)

## 2020-09-09 NOTE — Patient Instructions (Signed)
[facility] at [facility] Discharge Instructions  You were seen today by Dr. Katragadda. He went over your recent results. Dr. Katragadda will see you back in 4 weeks for labs and follow up.   Thank you for choosing [facility] at [facility] to provide your oncology and hematology care.  To afford each patient quality time with our provider, please arrive at least 15 minutes before your scheduled appointment time.   If you have a lab appointment with the Cancer Center please come in thru the Main Entrance and check in at the main information desk  You need to re-schedule your appointment should you arrive 10 or more minutes late.  We strive to give you quality time with our providers, and arriving late affects you and other patients whose appointments are after yours.  Also, if you no show three or more times for appointments you may be dismissed from the clinic at the providers discretion.     Again, thank you for choosing [facility].  Our hope is that these requests will decrease the amount of time that you wait before being seen by our physicians.       _____________________________________________________________  Should you have questions after your visit to [facility], please contact our office at [phone] between the hours of 8:00 a.m. and 4:30 p.m.  Voicemails left after 4:00 p.m. will not be returned until the following business day.  For prescription refill requests, have your pharmacy contact our office and allow 72 hours.    Cancer Center Support Programs:   > Cancer Support Group  2nd Tuesday of the month 1pm-2pm, Journey Room    

## 2020-09-09 NOTE — Progress Notes (Signed)
[place], [place] [address]   CLINIC:  Medical Oncology/Hematology  PCP:  Default, Provider, MD None None   REASON FOR VISIT:  Follow-up for metastatic left breast cancer  PRIOR THERAPY: None  NGS Results: ER/PR positive, HER-2 negative, Ki-67 60%  CURRENT THERAPY: Arimidex QD; Ibrance 100 mg 3 weeks on, 1 week off  BRIEF ONCOLOGIC HISTORY:  Oncology History   No history exists.    CANCER STAGING: Cancer Staging No matching staging information was found for the patient.  INTERVAL HISTORY:  Kaitlin [place], a 64 y.o. female, returns for routine follow-up of her metastatic left breast cancer. Kaitlin [place] was last seen on 08/05/2020.   Today she reports feeling well. She is currently on her off week for her [place] and will begin on 12/31; she continues taking Arimidex QD. She reports having an ulcer on the tip of her tongue. She takes Percocet BID and occasionally TID, depending on her workload; if she needs to be active she takes it more often. She denies having any new coughs, N/V/D, F/C, or leg swelling.  She has not seen the dentist yet. She has consulted genetics and will get her blood drawn today. She saw the ortho surgeon on 12/13 and she was discharged from his office.   REVIEW OF SYSTEMS:  Review of Systems  Constitutional: Positive for fatigue (75%). Negative for appetite change, chills and fever.  HENT:   Positive for mouth sores (on tip of tongue).   Respiratory: Negative for cough.   Cardiovascular: Negative for leg swelling.  Gastrointestinal: Negative for diarrhea, nausea and vomiting.  Musculoskeletal: Positive for myalgias (7/10 generalized body ache).  Neurological: Positive for numbness (tingling in L arm).  All other systems reviewed and are negative.   PAST MEDICAL/SURGICAL HISTORY:  Past Medical History:  Diagnosis Date  . Breast cancer ([place])   . Family history of breast cancer   . High cholesterol     Past Surgical History:  Procedure Laterality Date  . CHOLECYSTECTOMY    . neck surgery     plate placed    SOCIAL HISTORY:  Social History   Socioeconomic History  . Marital status: Married    Spouse name: Not on file  . Number of children: 2  . Years of education: Not on file  . Highest education level: Not on file  Occupational History  . Occupation: retired  Tobacco Use  . Smoking status: Never Smoker  . Smokeless tobacco: Never Used  Vaping Use  . Vaping Use: Never used  Substance and Sexual Activity  . Alcohol use: Never  . Drug use: Never  . Sexual activity: Not Currently  Other Topics Concern  . Not on file  Social History Narrative  . Not on file   Social Determinants of Health   Financial Resource Strain: Low Risk   . Difficulty of Paying Living Expenses: Not hard at all  Food Insecurity: No Food Insecurity  . Worried About Charity fundraiser in the Last Year: Never true  . Ran Out of Food in the Last Year: Never true  Transportation Needs: No Transportation Needs  . Lack of Transportation (Medical): No  . Lack of Transportation (Non-Medical): No  Physical Activity: Inactive  . Days of Exercise per Week: 0 days  . Minutes of Exercise per Session: 0 min  Stress: No Stress Concern Present  . Feeling of Stress : Not at all  Social Connections: Moderately Integrated  .  Frequency of Communication with Friends and Family: Three times a week  . Frequency of Social Gatherings with Friends and Family: Three times a week  . Attends Religious Services: 1 to 4 times per year  . Active Member of Clubs or Organizations: No  . Attends Archivist Meetings: Never  . Marital Status: Married  Human resources officer Violence: Not At Risk  . Fear of Current or Ex-Partner: No  . Emotionally Abused: No  . Physically Abused: No  . Sexually Abused: No    FAMILY HISTORY:  Family History  Adopted: Yes  Problem Relation Age of Onset  . Breast cancer Mother         dx 14s  . Brain cancer Father        vs brain tumor, d. 70  . Breast cancer Sister        dx 18  . Breast cancer Sister        dx 4s  . Liver cancer Brother   . Alcohol abuse Brother   . Asthma Son     CURRENT MEDICATIONS:  Current Outpatient Medications  Medication Sig Dispense Refill  . anastrozole (ARIMIDEX) 1 MG tablet Take 1 tablet (1 mg total) by mouth daily. 30 tablet 6  . ondansetron (ZOFRAN) 8 MG tablet Take 1 tablet (8 mg total) by mouth every 8 (eight) hours as needed for nausea or vomiting. 60 tablet 2  . oxyCODONE-acetaminophen (PERCOCET/ROXICET) 5-325 MG tablet Take 1 tablet by mouth every 6 (six) hours as needed for moderate pain. 60 tablet 0  . palbociclib (IBRANCE) 100 MG tablet Take 1 tablet (100 mg total) by mouth daily. Take for 21 days on, 7 days off, repeat every 28 days. 21 tablet 0   No current facility-administered medications for this visit.    ALLERGIES:  Allergies  Allergen Reactions  . Codeine Nausea And Vomiting    PHYSICAL EXAM:  Performance status (ECOG): 2 - Symptomatic, <50% confined to bed  Vitals:   09/09/20 1140  BP: (!) 142/67  Pulse: 77  Resp: 18  Temp: (!) 96.9 F (36.1 C)  SpO2: 100%   Wt Readings from Last 3 Encounters:  09/09/20 204 lb (92.5 kg)  08/05/20 206 lb 11.2 oz (93.8 kg)  07/17/20 204 lb 12.8 oz (92.9 kg)   Physical Exam Vitals reviewed.  Constitutional:      Appearance: Normal appearance. She is obese.  Cardiovascular:     Rate and Rhythm: Normal rate and regular rhythm.     Pulses: Normal pulses.     Heart sounds: Normal heart sounds.  Pulmonary:     Effort: Pulmonary effort is normal.     Breath sounds: Normal breath sounds.  Musculoskeletal:     Right lower leg: No edema.     Left lower leg: Edema (trace) present.  Neurological:     General: No focal deficit present.     Mental Status: She is alert and oriented to person, place, and time.  Psychiatric:        Mood and Affect: Mood normal.         Behavior: Behavior normal.      LABORATORY DATA:  I have reviewed the labs as listed.  CBC Latest Ref Rng & Units 09/09/2020 08/15/2020 08/05/2020  WBC 4.0 - 10.5 K/uL 3.0(L) 3.4(L) 2.9(L)  Hemoglobin 12.0 - 15.0 g/dL 11.7(L) 10.9(L) 11.6(L)  Hematocrit 36.0 - 46.0 % 35.9(L) 34.5(L) 36.9  Platelets 150 - 400 K/uL 184 243 184   CMP  Latest Ref Rng & Units 09/09/2020 08/05/2020 06/17/2020  Glucose 70 - 99 mg/dL 104(H) 101(H) 101(H)  BUN 8 - 23 mg/dL _0 Creatinine 0.44 - 1.00 mg/dL 0.82 1.10(H) 0.96  Sodium 135 - 145 mmol/L 138 137 138  Potassium 3.5 - 5.1 mmol/L 3.7 3.6 3.8  Chloride 98 - 111 mmol/L 102 100 99  CO2 22 - 32 mmol/L _1 Calcium 8.9 - 10.3 mg/dL 8.9 9.0 9.5  Total Protein 6.5 - 8.1 g/dL 7.3 7.8 7.2  Total Bilirubin 0.3 - 1.2 mg/dL 0.2(L) 0.5 0.6  Alkaline Phos 38 - 126 U/L 166(H) 220(H) 230(H)  AST 15 - 41 U/L 12(L) 20 28  ALT 0 - 44 U/L _2 DIAGNOSTIC IMAGING:  I have independently reviewed the scans and discussed with the patient. No results found.   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 She did not seek medical attention. She did not have mammograms for the past 30 years. -Reported 26 pound weight loss which was intentional in the last 6 months. -Developed fracture of the proximal left humeral diaphysis when she picked up her cat. -CT humerus left without contrast on 05/23/2020 shows minimally displaced oblique fracture of the proximal left humeral diaphysis with no definitive CT evidence of underlying lesion. Nonspecific left axillary adenopathy. -Physical exam today reveals left breast mass occupying the majority of the outer quadrant with skin thickening. Could not palpate the axillary lymph node as she was unable to lift her arm. -PET scan on 06/24/2020 showed irregular hypermetabolic 8.1 x 4.0 left breast mass compatible with malignancy  with left axillary nodal metastasis. Extensive multifocal mixed lytic and sclerotic metastatic disease throughout the axial and proximal appendicular skeleton. -Ultrasound-guided left breast biopsy on 07/11/2020 shows invasive mammary carcinoma, grade 2. ER/PR 90% positive. Ki-67 60%. HER-2 2+ by IHC. HER-2 negative by FISH. -Anastrozole and palbociclib started on 07/19/2020.  2. Social/family history: -Lives at [place] with husband. Never smoker. 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   PLAN:  1.Metastatic ER positive left breast cancer to the bones: -She is continuing to take anastrozole without fail. -She is continuing to tolerate Ibrance very well.  Today is her week off.  She will start back on 09/13/2020. -Reviewed labs from today which showed white count of 3.0 with ANC of 1.1.  Hemoglobin and platelets are normal.  LFTs show improvement in alkaline phosphatase to 166 from 220. -We will see her back in 5 weeks for follow-up.  We will plan to repeat tumor markers.  If there is any worsening, will consider scans.  2.Bone metastasis: -She has not seen the dentist yet.  Once she is cleared by her dentist, will consider denosumab.  3. Right shoulder blade pain: -Continue Percocet 5/325 2 to 3 tablets daily.  4. Family history: -Genetic testing was recommended based on family history.  We will make a referral.  5. Nausea/vomiting: -She is not requiring Zofran.  6. Left humerus pathological fracture: -Continue follow-up with orthopedics.   Orders placed this encounter:  Orders Placed This Encounter  Procedures  . CBC with Differential/Platelet  . Comprehensive metabolic panel  . Cancer antigen 15-3     Derek Jack, MD Center [phone]   I, Milinda Antis, am acting as a scribe for Dr. Sanda Linger.  I,  Derek Jack MD, have reviewed the above  documentation for accuracy and completeness, and I agree with the above.

## 2020-09-16 ENCOUNTER — Telehealth (HOSPITAL_COMMUNITY): Payer: Self-pay | Admitting: Pharmacy Technician

## 2020-10-03 ENCOUNTER — Other Ambulatory Visit (HOSPITAL_COMMUNITY): Payer: Self-pay

## 2020-10-03 MED ORDER — OXYCODONE-ACETAMINOPHEN 5-325 MG PO TABS
1.0000 | ORAL_TABLET | Freq: Four times a day (QID) | ORAL | 0 refills | Status: DC | PRN
Start: 2020-10-03 — End: 2020-11-18

## 2020-10-09 NOTE — Telephone Encounter (Signed)
[name] Encounter  [place] Patient Assistance when patient was approved to see if a new application was needed for January renewal.  Rep stated that since her application was submitted after 07/15/20 that another application would not be required.  [employer] will continue to use the current application to process her [employer] patient assistance phone number for follow up is [phone].    This encounter will be updated until final determination.  [place] Patient [place] Phone [phone] Fax [phone]

## 2020-10-09 NOTE — Telephone Encounter (Signed)
Oral Oncology Patient [facility] to check status of renewal application.  Rep stated that her application was still in process.  I will call back and check on her application in a couple days.  [facility] Patient [place] Phone [phone] Fax [phone] 10/09/2020 10:14 AM

## 2020-10-15 ENCOUNTER — Inpatient Hospital Stay (HOSPITAL_COMMUNITY): Payer: Medicare Other | Attending: Hematology

## 2020-10-15 ENCOUNTER — Other Ambulatory Visit: Payer: Self-pay

## 2020-10-15 DIAGNOSIS — Z79811 Long term (current) use of aromatase inhibitors: Secondary | ICD-10-CM | POA: Diagnosis not present

## 2020-10-15 DIAGNOSIS — C50912 Malignant neoplasm of unspecified site of left female breast: Secondary | ICD-10-CM | POA: Insufficient documentation

## 2020-10-15 DIAGNOSIS — Z170 Estrogen receptor positive status [ER+]: Secondary | ICD-10-CM | POA: Diagnosis not present

## 2020-10-15 DIAGNOSIS — C7951 Secondary malignant neoplasm of bone: Secondary | ICD-10-CM | POA: Diagnosis not present

## 2020-10-15 LAB — COMPREHENSIVE METABOLIC PANEL
ALT: 13 U/L (ref 0–44)
AST: 18 U/L (ref 15–41)
Albumin: 3.8 g/dL (ref 3.5–5.0)
Alkaline Phosphatase: 116 U/L (ref 38–126)
Anion gap: 7 (ref 5–15)
BUN: 12 mg/dL (ref 8–23)
CO2: 27 mmol/L (ref 22–32)
Calcium: 8.8 mg/dL — ABNORMAL LOW (ref 8.9–10.3)
Chloride: 104 mmol/L (ref 98–111)
Creatinine, Ser: 1.18 mg/dL — ABNORMAL HIGH (ref 0.44–1.00)
GFR, Estimated: 51 mL/min — ABNORMAL LOW (ref >60)
Glucose, Bld: 97 mg/dL (ref 70–99)
Potassium: 3.7 mmol/L (ref 3.5–5.1)
Sodium: 138 mmol/L (ref 135–145)
Total Bilirubin: 0.4 mg/dL (ref 0.3–1.2)
Total Protein: 7.2 g/dL (ref 6.5–8.1)

## 2020-10-15 LAB — CBC WITH DIFFERENTIAL/PLATELET
Abs Immature Granulocytes: 0.02 10*3/uL (ref 0.00–0.07)
Basophils Absolute: 0.1 10*3/uL (ref 0.0–0.1)
Basophils Relative: 2 %
Eosinophils Absolute: 0.1 10*3/uL (ref 0.0–0.5)
Eosinophils Relative: 1 %
HCT: 36.5 % (ref 36.0–46.0)
Hemoglobin: 12 g/dL (ref 12.0–15.0)
Immature Granulocytes: 1 %
Lymphocytes Relative: 38 %
Lymphs Abs: 1.6 10*3/uL (ref 0.7–4.0)
MCH: 31.7 pg (ref 26.0–34.0)
MCHC: 32.9 g/dL (ref 30.0–36.0)
MCV: 96.3 fL (ref 80.0–100.0)
Monocytes Absolute: 0.3 10*3/uL (ref 0.1–1.0)
Monocytes Relative: 8 %
Neutro Abs: 2.1 10*3/uL (ref 1.7–7.7)
Neutrophils Relative %: 50 %
Platelets: 245 10*3/uL (ref 150–400)
RBC: 3.79 MIL/uL — ABNORMAL LOW (ref 3.87–5.11)
RDW: 18.2 % — ABNORMAL HIGH (ref 11.5–15.5)
WBC: 4.2 10*3/uL (ref 4.0–10.5)
nRBC: 0 % (ref 0.0–0.2)

## 2020-10-16 ENCOUNTER — Other Ambulatory Visit: Payer: Self-pay

## 2020-10-16 ENCOUNTER — Inpatient Hospital Stay (HOSPITAL_BASED_OUTPATIENT_CLINIC_OR_DEPARTMENT_OTHER): Payer: Medicare Other | Admitting: Hematology

## 2020-10-16 VITALS — BP 136/65 | HR 79 | Temp 98.2°F | Resp 18 | Wt 196.4 lb

## 2020-10-16 DIAGNOSIS — Z170 Estrogen receptor positive status [ER+]: Secondary | ICD-10-CM | POA: Diagnosis not present

## 2020-10-16 DIAGNOSIS — C50912 Malignant neoplasm of unspecified site of left female breast: Secondary | ICD-10-CM

## 2020-10-16 LAB — CANCER ANTIGEN 15-3: CA 15-3: 30.3 U/mL — ABNORMAL HIGH (ref 0.0–25.0)

## 2020-10-16 NOTE — Progress Notes (Signed)
[facility] [address], [place] [address]   Patient Care Team: Default, Provider, MD as PCP - General  SUMMARY OF ONCOLOGIC HISTORY: Oncology History   No history exists.    CHIEF COMPLIANT: Follow-up for metastatic left breast cancer   INTERVAL HISTORY: [name] is a 65 y.o. female here today for follow up of her metastatic left breast cancer. Her last visit was on 09/09/2020.   Today she reports feeling okay. She reports that on the week that she has not taken Ibrance, her pain was much better than the previous 2 times that she paused Ibrance for 1 week; she denies having severe fatigue but has not noticed a difference in her breast mass. She continues taking Arimidex daily. She is taking Percocet once every 2-3 days. She is taking calcium and vitamin D daily. She notes having trouble grasping things with her left hand due to it being swollen, but otherwise reports no other issues with her arm; she was discharged from ortho.  She is currently packing up her house in preparation for a move. She has not seen the dentist yet due to issues with her funds.   REVIEW OF SYSTEMS:   Review of Systems  Constitutional: Positive for appetite change (75%) and fatigue (75%).  Musculoskeletal: Negative for arthralgias.  All other systems reviewed and are negative.   I have reviewed the past medical history, past surgical history, social history and family history with the patient and they are unchanged from previous note.   ALLERGIES:   is allergic to codeine.   MEDICATIONS:  Current Outpatient Medications  Medication Sig Dispense Refill  . anastrozole (ARIMIDEX) 1 MG tablet Take 1 tablet (1 mg total) by mouth daily. 30 tablet 6  . palbociclib (IBRANCE) 100 MG tablet Take 1 tablet (100 mg total) by mouth daily. Take for 21 days on, 7 days off, repeat every 28 days. 21 tablet 0  . ondansetron (ZOFRAN) 8 MG tablet Take 1 tablet (8 mg total) by mouth every  8 (eight) hours as needed for nausea or vomiting. (Patient not taking: Reported on 10/16/2020) 60 tablet 2  . oxyCODONE-acetaminophen (PERCOCET/ROXICET) 5-325 MG tablet Take 1 tablet by mouth every 6 (six) hours as needed for moderate pain. (Patient not taking: Reported on 10/16/2020) 60 tablet 0   No current facility-administered medications for this visit.     PHYSICAL EXAMINATION: Performance status (ECOG): 2 - Symptomatic, <50% confined to bed  Vitals:   10/16/20 1055  BP: 136/65  Pulse: 79  Resp: 18  Temp: 98.2 F (36.8 C)  SpO2: 100%   Wt Readings from Last 3 Encounters:  10/16/20 196 lb 6.9 oz (89.1 kg)  09/09/20 204 lb (92.5 kg)  08/05/20 206 lb 11.2 oz (93.8 kg)   Physical Exam Vitals reviewed.  Constitutional:      Appearance: Normal appearance.  Cardiovascular:     Rate and Rhythm: Normal rate and regular rhythm.     Pulses: Normal pulses.     Heart sounds: Normal heart sounds.  Pulmonary:     Effort: Pulmonary effort is normal.     Breath sounds: Normal breath sounds.  Chest:  Breasts:     Left: Mass and skin change present. No bleeding, inverted nipple, nipple discharge or axillary adenopathy.     Lymphadenopathy:     Upper Body:     Left upper body: No axillary or pectoral adenopathy.  Neurological:     General: No focal  deficit present.     Mental Status: She is alert and oriented to person, place, and time.  Psychiatric:        Mood and Affect: Mood normal.        Behavior: Behavior normal.     Breast Exam Chaperone: Milinda Antis, MD     LABORATORY DATA:  I have reviewed the data as listed CMP Latest Ref Rng & Units 10/15/2020 09/09/2020 08/05/2020  Glucose 70 - 99 mg/dL 97 104(H) 101(H)  BUN 8 - 23 mg/dL '12 11 11  ' Creatinine 0.44 - 1.00 mg/dL 1.18(H) 0.82 1.10(H)  Sodium 135 - 145 mmol/L 138 138 137  Potassium 3.5 - 5.1 mmol/L 3.7 3.7 3.6  Chloride 98 - 111 mmol/L 104 102 100  CO2 22 - 32 mmol/L '27 28 27  ' Calcium 8.9 - 10.3 mg/dL 8.8(L)  8.9 9.0  Total Protein 6.5 - 8.1 g/dL 7.2 7.3 7.8  Total Bilirubin 0.3 - 1.2 mg/dL 0.4 0.2(L) 0.5  Alkaline Phos 38 - 126 U/L 116 166(H) 220(H)  AST 15 - 41 U/L 18 12(L) 20  ALT 0 - 44 U/L '13 9 16   ' Lab Results  Component Value Date   CAN153 30.3 (H) 10/15/2020   CAN153 60.4 (H) 06/17/2020   Lab Results  Component Value Date   WBC 4.2 10/15/2020   HGB 12.0 10/15/2020   HCT 36.5 10/15/2020   MCV 96.3 10/15/2020   PLT 245 10/15/2020   NEUTROABS 2.1 10/15/2020    ASSESSMENT:  1.Metastatic ER/PR positive left breast 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 She did not seek medical attention. She did not have mammograms for the past 30 years. -Reported 26 pound weight loss which was intentional in the last 6 months. -Developed fracture of the proximal left humeral diaphysis when she picked up her cat. -CT humerus left without contrast on 05/23/2020 shows minimally displaced oblique fracture of the proximal left humeral diaphysis with no definitive CT evidence of underlying lesion. Nonspecific left axillary adenopathy. -Physical exam today reveals left breast mass occupying the majority of the outer quadrant with skin thickening. Could not palpate the axillary lymph node as she was unable to lift her arm. -PET scan on 06/24/2020 showed irregular hypermetabolic 8.1 x 4.0 left breast mass compatible with malignancy with left axillary nodal metastasis. Extensive multifocal mixed lytic and sclerotic metastatic disease throughout the axial and proximal appendicular skeleton. -Ultrasound-guided left breast biopsy on 07/11/2020 shows invasive mammary carcinoma, grade 2. ER/PR 90% positive. Ki-67 60%. HER-2 2+ by IHC. HER-2 negative by FISH. -Anastrozole and palbociclib started on 07/19/2020.  2. Social/family history: -Lives at home with husband. Never smoker. 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 -Her biological mother and 2  sisters died of breast cancer.   PLAN:  1.Metastatic ER positive left breast cancer to the bones: -She is continuing to take anastrozole daily. -She is also tolerating Ibrance reasonably well. -Reviewed labs from 10/15/2020 which showed normal white count, platelets and hemoglobin.  LFTs are normal. -CA 15-3 improved to 30.3 from 60. -Breast exam shows 8 x 10 cm left breast central mass which is freely mobile with very minimal skin changes. -I have recommended continuing Ibrance and anastrozole.  I plan to see her back in 1 month for follow-up.  I plan to repeat bone scan and CT scan to evaluate response.  2.Bone metastasis: -She cannot afford to see a dentist right now.  Starting of denosumab is on  hold at this time because of poor dentition.  3. Right shoulder blade pain: -Continue Percocet as needed.  4. Family history: -She has done genetic testing, result of which is pending.  5. Nausea/vomiting: -Continue Zofran as needed.  6. Left humerus pathological fracture: -Left shoulder sling was discontinued.   Breast Cancer therapy associated bone loss: I have recommended calcium, Vitamin D and weight bearing exercises.   No orders of the defined types were placed in this encounter.  The patient has a good understanding of the overall plan. she agrees with it. she will call with any problems that may develop before the next visit here.    Derek Jack, MD [place] [phone]   I, Milinda Antis, am acting as a scribe for Dr. Sanda Linger.  I, Derek Jack MD, have reviewed the above documentation for accuracy and completeness, and I agree with the above.

## 2020-10-16 NOTE — Patient Instructions (Signed)
[place] at [facility] Discharge Instructions  You were seen today by Dr. Delton Coombes. He went over your recent results. You will be scheduled for a CT scan of your chest and abdomen, along with a bone scan before your next visit. Dr. Delton Coombes will see you back in 4 weeks for labs and follow up.   Thank you for choosing [place] at [facility] to provide your oncology and hematology care.  To afford each patient quality time with our provider, please arrive at least 15 minutes before your scheduled appointment time.   If you have a lab appointment with the [facility] please come in thru the Main Entrance and check in at the main information desk  You need to re-schedule your appointment should you arrive 10 or more minutes late.  We strive to give you quality time with our providers, and arriving late affects you and other patients whose appointments are after yours.  Also, if you no show three or more times for appointments you may be dismissed from the clinic at the providers discretion.     Again, thank you for choosing [facility].  Our hope is that these requests will decrease the amount of time that you wait before being seen by our physicians.       _____________________________________________________________  Should you have questions after your visit to [facility], please contact our office at [phone] between the hours of 8:00 a.m. and 4:30 p.m.  Voicemails left after 4:00 p.m. will not be returned until the following business day.  For prescription refill requests, have your pharmacy contact our office and allow 72 hours.    Cancer Center Support Programs:   > Cancer Support Group  2nd Tuesday of the month 1pm-2pm, Journey Room

## 2020-10-22 ENCOUNTER — Telehealth: Payer: Self-pay | Admitting: Licensed Clinical Social Worker

## 2020-10-22 NOTE — Telephone Encounter (Signed)
Left voicemail for patient regarding genetic testing. The lab never received her sample; we can have her get her blood drawn again or send saliva kit to her house. I requested a call back to discuss.

## 2020-10-25 NOTE — Telephone Encounter (Signed)
[name] Encounter  Received notification from [place] Patient Assistance Program that patient has been successfully enrolled into their program to receive Ibrance from the manufacturer at $0 out of pocket until 09/13/21.    I will call and inform the patient of the approval.  Specialty Pharmacy that will dispense medication is [facility].  Patient knows to call the office with questions or concerns.   Oral Oncology Clinic will continue to follow.  [place] Patient [place] Phone [phone] Fax [phone] 10/25/2020 11:00 AM

## 2020-11-04 ENCOUNTER — Encounter (HOSPITAL_COMMUNITY)
Admission: RE | Admit: 2020-11-04 | Discharge: 2020-11-04 | Disposition: A | Discharge status: Home / Self Care | Payer: Medicare Other | Source: Ambulatory Visit | Attending: Hematology | Admitting: Hematology

## 2020-11-04 ENCOUNTER — Other Ambulatory Visit (HOSPITAL_COMMUNITY): Payer: Self-pay

## 2020-11-04 ENCOUNTER — Other Ambulatory Visit: Payer: Self-pay

## 2020-11-04 ENCOUNTER — Other Ambulatory Visit (HOSPITAL_COMMUNITY): Payer: Self-pay | Admitting: Hematology

## 2020-11-04 ENCOUNTER — Encounter (HOSPITAL_COMMUNITY): Payer: Self-pay

## 2020-11-04 ENCOUNTER — Encounter: Payer: Self-pay | Admitting: Pharmacy Technician

## 2020-11-04 DIAGNOSIS — C50919 Malignant neoplasm of unspecified site of unspecified female breast: Secondary | ICD-10-CM

## 2020-11-04 DIAGNOSIS — C50912 Malignant neoplasm of unspecified site of left female breast: Secondary | ICD-10-CM | POA: Diagnosis present

## 2020-11-04 DIAGNOSIS — Z170 Estrogen receptor positive status [ER+]: Secondary | ICD-10-CM | POA: Diagnosis present

## 2020-11-04 MED ORDER — TECHNETIUM TC 99M MEDRONATE IV KIT
20.0000 | PACK | Freq: Once | INTRAVENOUS | Status: AC | PRN
  Administered 2020-11-04: 19.3 via INTRAVENOUS

## 2020-11-04 MED ORDER — PALBOCICLIB 100 MG PO TABS: 100.0000 mg | ORAL_TABLET | Freq: Every day | ORAL | 3 refills | Status: DC

## 2020-11-04 NOTE — Progress Notes (Signed)
Chart reviewed. Ibrance refilled per Dr. Delton Coombes

## 2020-11-08 ENCOUNTER — Encounter (HOSPITAL_COMMUNITY): Payer: Self-pay

## 2020-11-09 ENCOUNTER — Encounter (HOSPITAL_COMMUNITY): Payer: Self-pay

## 2020-11-12 ENCOUNTER — Ambulatory Visit (HOSPITAL_COMMUNITY): Admission: RE | Admit: 2020-11-12 | Payer: Medicare Other | Source: Ambulatory Visit

## 2020-11-12 ENCOUNTER — Inpatient Hospital Stay (HOSPITAL_COMMUNITY): Payer: Medicare Other

## 2020-11-14 ENCOUNTER — Ambulatory Visit (HOSPITAL_COMMUNITY): Payer: Medicare Other | Admitting: Hematology

## 2020-11-18 ENCOUNTER — Ambulatory Visit (HOSPITAL_COMMUNITY): Payer: Medicare Other | Admitting: Hematology

## 2020-11-18 ENCOUNTER — Other Ambulatory Visit (HOSPITAL_COMMUNITY): Payer: Self-pay | Admitting: Surgery

## 2020-11-18 MED ORDER — OXYCODONE-ACETAMINOPHEN 5-325 MG PO TABS: 1.0000 | ORAL_TABLET | Freq: Four times a day (QID) | ORAL | 0 refills | Status: DC | PRN

## 2020-12-16 ENCOUNTER — Inpatient Hospital Stay (HOSPITAL_COMMUNITY): Payer: Medicare Other | Attending: Hematology

## 2020-12-16 ENCOUNTER — Other Ambulatory Visit: Payer: Self-pay

## 2020-12-16 ENCOUNTER — Ambulatory Visit (HOSPITAL_COMMUNITY)
Admission: RE | Admit: 2020-12-16 | Discharge: 2020-12-16 | Disposition: A | Discharge status: Home / Self Care | Payer: Medicare Other | Source: Ambulatory Visit | Attending: Hematology | Admitting: Hematology

## 2020-12-16 DIAGNOSIS — C50912 Malignant neoplasm of unspecified site of left female breast: Secondary | ICD-10-CM | POA: Insufficient documentation

## 2020-12-16 DIAGNOSIS — Z170 Estrogen receptor positive status [ER+]: Secondary | ICD-10-CM | POA: Insufficient documentation

## 2020-12-16 DIAGNOSIS — C7951 Secondary malignant neoplasm of bone: Secondary | ICD-10-CM | POA: Diagnosis not present

## 2020-12-16 DIAGNOSIS — Z79811 Long term (current) use of aromatase inhibitors: Secondary | ICD-10-CM | POA: Diagnosis not present

## 2020-12-16 LAB — CBC WITH DIFFERENTIAL/PLATELET
Abs Immature Granulocytes: 0.02 10*3/uL (ref 0.00–0.07)
Basophils Absolute: 0.1 10*3/uL (ref 0.0–0.1)
Basophils Relative: 2 %
Eosinophils Absolute: 0.1 10*3/uL (ref 0.0–0.5)
Eosinophils Relative: 1 %
HCT: 36.7 % (ref 36.0–46.0)
Hemoglobin: 12.3 g/dL (ref 12.0–15.0)
Immature Granulocytes: 1 %
Lymphocytes Relative: 50 %
Lymphs Abs: 2 10*3/uL (ref 0.7–4.0)
MCH: 34 pg (ref 26.0–34.0)
MCHC: 33.5 g/dL (ref 30.0–36.0)
MCV: 101.4 fL — ABNORMAL HIGH (ref 80.0–100.0)
Monocytes Absolute: 0.3 10*3/uL (ref 0.1–1.0)
Monocytes Relative: 7 %
Neutro Abs: 1.5 10*3/uL — ABNORMAL LOW (ref 1.7–7.7)
Neutrophils Relative %: 39 %
Platelets: 235 10*3/uL (ref 150–400)
RBC: 3.62 MIL/uL — ABNORMAL LOW (ref 3.87–5.11)
RDW: 14.2 % (ref 11.5–15.5)
WBC: 3.8 10*3/uL — ABNORMAL LOW (ref 4.0–10.5)
nRBC: 0 % (ref 0.0–0.2)

## 2020-12-16 LAB — COMPREHENSIVE METABOLIC PANEL
ALT: 12 U/L (ref 0–44)
AST: 16 U/L (ref 15–41)
Albumin: 4 g/dL (ref 3.5–5.0)
Alkaline Phosphatase: 88 U/L (ref 38–126)
Anion gap: 9 (ref 5–15)
BUN: 17 mg/dL (ref 8–23)
CO2: 26 mmol/L (ref 22–32)
Calcium: 9.2 mg/dL (ref 8.9–10.3)
Chloride: 102 mmol/L (ref 98–111)
Creatinine, Ser: 1.11 mg/dL — ABNORMAL HIGH (ref 0.44–1.00)
GFR, Estimated: 55 mL/min — ABNORMAL LOW (ref >60)
Glucose, Bld: 89 mg/dL (ref 70–99)
Potassium: 4.5 mmol/L (ref 3.5–5.1)
Sodium: 137 mmol/L (ref 135–145)
Total Bilirubin: 0.5 mg/dL (ref 0.3–1.2)
Total Protein: 7.3 g/dL (ref 6.5–8.1)

## 2020-12-16 LAB — POCT I-STAT CREATININE: Creatinine, Ser: 1.1 mg/dL — ABNORMAL HIGH (ref 0.44–1.00)

## 2020-12-16 MED ORDER — IOHEXOL 300 MG/ML  SOLN
100.0000 mL | Freq: Once | INTRAMUSCULAR | Status: AC | PRN
  Administered 2020-12-16: 100 mL via INTRAVENOUS

## 2020-12-17 LAB — CANCER ANTIGEN 15-3: CA 15-3: 25.5 U/mL — ABNORMAL HIGH (ref 0.0–25.0)

## 2020-12-19 ENCOUNTER — Other Ambulatory Visit: Payer: Self-pay

## 2020-12-19 ENCOUNTER — Inpatient Hospital Stay (HOSPITAL_BASED_OUTPATIENT_CLINIC_OR_DEPARTMENT_OTHER): Payer: Medicare Other | Admitting: Hematology

## 2020-12-19 VITALS — BP 159/75 | HR 74 | Temp 96.9°F | Resp 20 | Wt 191.1 lb

## 2020-12-19 DIAGNOSIS — C50912 Malignant neoplasm of unspecified site of left female breast: Secondary | ICD-10-CM

## 2020-12-19 DIAGNOSIS — Z170 Estrogen receptor positive status [ER+]: Secondary | ICD-10-CM | POA: Diagnosis not present

## 2020-12-19 NOTE — Progress Notes (Signed)
[facility] [address] [place] [address]   Patient Care Team: Default, Provider, MD as PCP - General  SUMMARY OF ONCOLOGIC HISTORY: Oncology History   No history exists.    CHIEF COMPLIANT: Follow-up for left breast cancer   INTERVAL HISTORY: [name] is a 65 y.o. female here today for follow up of her left breast cancer. Her last visit was on 10/16/2020.   Today she reports feeling okay. She reports developing diffuse body pains and diarrhea the day after having the CT scan with contrast on 04/04. She continues taking Arimidex daily and Ibrance 100 mg 3/4 weeks and denies having N/V/D. She continues taking Percocet in the morning or less often than daily for her arm pain. The mobility has improved in her left shoulder and her only issue is mobility in her left hand when grasping objects.   REVIEW OF SYSTEMS:   Review of Systems  Constitutional: Positive for fatigue (75%). Negative for appetite change.  Gastrointestinal: Positive for diarrhea (after CT contrast). Negative for nausea and vomiting.  Musculoskeletal: Positive for arthralgias (L arm pain improving) and myalgias (after CT contrast).  All other systems reviewed and are negative.   I have reviewed the past medical history, past surgical history, social history and family history with the patient and they are unchanged from previous note.   ALLERGIES:   is allergic to codeine.   MEDICATIONS:  Current Outpatient Medications  Medication Sig Dispense Refill  . anastrozole (ARIMIDEX) 1 MG tablet Take 1 tablet (1 mg total) by mouth daily. 30 tablet 6  . ondansetron (ZOFRAN) 8 MG tablet Take 1 tablet (8 mg total) by mouth every 8 (eight) hours as needed for nausea or vomiting. 60 tablet 2  . oxyCODONE-acetaminophen (PERCOCET/ROXICET) 5-325 MG tablet Take 1 tablet by mouth every 6 (six) hours as needed for moderate pain. 60 tablet 0  . palbociclib (IBRANCE) 100 MG tablet TAKE 1 TABLET  (100 MG TOTAL) BY MOUTH DAILY. TAKE FOR 21 DAYS ON, 7 DAYS OFF, REPEAT EVERY 28 DAYS. 21 tablet 3   No current facility-administered medications for this visit.     PHYSICAL EXAMINATION: Performance status (ECOG): 2 - Symptomatic, <50% confined to bed  Vitals:   12/19/20 1517  BP: (!) 159/75  Pulse: 74  Resp: 20  Temp: (!) 96.9 F (36.1 C)  SpO2: 99%   Wt Readings from Last 3 Encounters:  12/19/20 191 lb 2.2 oz (86.7 kg)  10/16/20 196 lb 6.9 oz (89.1 kg)  09/09/20 204 lb (92.5 kg)   Physical Exam Vitals reviewed.  Constitutional:      Appearance: Normal appearance. She is obese.  Cardiovascular:     Rate and Rhythm: Normal rate and regular rhythm.     Pulses: Normal pulses.     Heart sounds: Normal heart sounds.  Pulmonary:     Effort: Pulmonary effort is normal.     Breath sounds: Normal breath sounds.  Chest:  Breasts:     Right: No axillary adenopathy or supraclavicular adenopathy.     Left: No axillary adenopathy or supraclavicular adenopathy.    Lymphadenopathy:     Upper Body:     Right upper body: No supraclavicular, axillary or pectoral adenopathy.     Left upper body: No supraclavicular, axillary or pectoral adenopathy.  Neurological:     General: No focal deficit present.     Mental Status: She is alert and oriented to person, place, and time.  Psychiatric:  Mood and Affect: Mood normal.        Behavior: Behavior normal.     Breast Exam Chaperone: Kaitlin Antis, MD     LABORATORY DATA:  I have reviewed the data as listed CMP Latest Ref Rng & Units 12/16/2020 12/16/2020 10/15/2020  Glucose 70 - 99 mg/dL 89 - 97  BUN 8 - 23 mg/dL 17 - 12  Creatinine 0.44 - 1.00 mg/dL 1.11(H) 1.10(H) 1.18(H)  Sodium 135 - 145 mmol/L 137 - 138  Potassium 3.5 - 5.1 mmol/L 4.5 - 3.7  Chloride 98 - 111 mmol/L 102 - 104  CO2 22 - 32 mmol/L 26 - 27  Calcium 8.9 - 10.3 mg/dL 9.2 - 8.8(L)  Total Protein 6.5 - 8.1 g/dL 7.3 - 7.2  Total Bilirubin 0.3 - 1.2 mg/dL 0.5  - 0.4  Alkaline Phos 38 - 126 U/L 88 - 116  AST 15 - 41 U/L 16 - 18  ALT 0 - 44 U/L 12 - 13   Lab Results  Component Value Date   CAN153 25.5 (H) 12/16/2020   CAN153 30.3 (H) 10/15/2020   CAN153 60.4 (H) 06/17/2020   Lab Results  Component Value Date   WBC 3.8 (L) 12/16/2020   HGB 12.3 12/16/2020   HCT 36.7 12/16/2020   MCV 101.4 (H) 12/16/2020   PLT 235 12/16/2020   NEUTROABS 1.5 (L) 12/16/2020    ASSESSMENT:  1.Metastatic ER/PR positive left breast 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 She did not seek medical attention. She did not have mammograms for the past 30 years. -Reported 26 pound weight loss which was intentional in the last 6 months. -Developed fracture of the proximal left humeral diaphysis when she picked up her cat. -CT humerus left without contrast on 05/23/2020 shows minimally displaced oblique fracture of the proximal left humeral diaphysis with no definitive CT evidence of underlying lesion. Nonspecific left axillary adenopathy. -Physical exam today reveals left breast mass occupying the majority of the outer quadrant with skin thickening. Could not palpate the axillary lymph node as she was unable to lift her arm. -PET scan on 06/24/2020 showed irregular hypermetabolic 8.1 x 4.0 left breast mass compatible with malignancy with left axillary nodal metastasis. Extensive multifocal mixed lytic and sclerotic metastatic disease throughout the axial and proximal appendicular skeleton. -Ultrasound-guided left breast biopsy on 07/11/2020 shows invasive mammary carcinoma, grade 2. ER/PR 90% positive. Ki-67 60%. HER-2 2+ by IHC. HER-2 negative by FISH. -Anastrozole and palbociclib started on 07/19/2020.  2. Social/family history: -Lives at home with husband. Never smoker. 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   PLAN:  1.Metastatic ER positive  left breast cancer to the bones: -She is continuing to tolerate anastrozole daily very well. -She is taking Ibrance 3 weeks on 1 week off without any major problems. -Reviewed CT CAP from 12/16/2020.  Slightly decreased left breast mass and decrease the left axillary lymph node suggestive of response.  Stable skeletal lesions.  No new lesions. -CA 15-3 is also improved to 25.5.  Labs were reviewed which showed mild leukopenia from [place].  LFTs are normal.  Creatinine is stable around 1.1. -Recommend continuing Ibrance and anastrozole at this time.  RTC 2 months for follow-up.  2.Bone metastasis: -We did not start her on denosumab because of poor dentition.  We have requested dental clearance.  3. Right shoulder blade pain: -She is requiring 1 Percocet at  bedtime daily.  4. Family history: -Apparently her sample was misplaced.  We will order her genetic testing again.  5. Nausea/vomiting: -Continue Zofran as needed.  6. Left humerus pathological fracture: -This has healed completely and range of motion has also significantly improved.   Breast Cancer therapy associated bone loss: I have recommended calcium, Vitamin D and weight bearing exercises.   Orders Placed This Encounter  Procedures  . CBC with Differential/Platelet    Standing Status:   Future    Standing Expiration Date:   12/19/2021    Order Specific Question:   Release to patient    Answer:   Immediate  . Comprehensive metabolic panel    Standing Status:   Future    Standing Expiration Date:   12/19/2021    Order Specific Question:   Release to patient    Answer:   Immediate  . Cancer antigen 15-3    Standing Status:   Future    Standing Expiration Date:   12/19/2021   The patient has a good understanding of the overall plan. she agrees with it. she will call with any problems that may develop before the next visit here.    Derek Jack, MD [place] [phone]   I, Kaitlin Baker, am acting as a scribe for Dr. Sanda Linger.  I, Derek Jack MD, have reviewed the above documentation for accuracy and completeness, and I agree with the above.

## 2020-12-19 NOTE — Patient Instructions (Signed)
[facility] Discharge Instructions  You were seen today by Dr. Katragadda. He went over your recent results and scans. Dr. Katragadda will see you back in 2 months for labs and follow up.   Thank you for choosing [facility] to provide your oncology and hematology care.  To afford each patient quality time with our provider, please arrive at least 15 minutes before your scheduled appointment time.   If you have a lab appointment with the Cancer Center please come in thru the Main Entrance and check in at the main information desk  You need to re-schedule your appointment should you arrive 10 or more minutes late.  We strive to give you quality time with our providers, and arriving late affects you and other patients whose appointments are after yours.  Also, if you no show three or more times for appointments you may be dismissed from the clinic at the providers discretion.     Again, thank you for choosing [facility].  Our hope is that these requests will decrease the amount of time that you wait before being seen by our physicians.       _____________________________________________________________  Should you have questions after your visit to [facility], please contact our office at [phone] between the hours of 8:00 a.m. and 4:30 p.m.  Voicemails left after 4:00 p.m. will not be returned until the following business day.  For prescription refill requests, have your pharmacy contact our office and allow 72 hours.    Cancer Center Support Programs:   > Cancer Support Group  2nd Tuesday of the month 1pm-2pm, Journey Room   

## 2021-01-09 ENCOUNTER — Other Ambulatory Visit (HOSPITAL_COMMUNITY): Payer: Self-pay

## 2021-01-09 MED ORDER — OXYCODONE-ACETAMINOPHEN 5-325 MG PO TABS: 1.0000 | ORAL_TABLET | Freq: Four times a day (QID) | ORAL | 0 refills | Status: DC | PRN

## 2021-01-13 ENCOUNTER — Other Ambulatory Visit: Payer: Self-pay

## 2021-01-13 ENCOUNTER — Inpatient Hospital Stay (HOSPITAL_COMMUNITY): Payer: Medicare Other | Attending: Hematology

## 2021-02-04 ENCOUNTER — Encounter: Payer: Self-pay | Admitting: Licensed Clinical Social Worker

## 2021-02-04 ENCOUNTER — Telehealth: Payer: Self-pay | Admitting: Licensed Clinical Social Worker

## 2021-02-04 ENCOUNTER — Ambulatory Visit: Payer: Self-pay | Admitting: Licensed Clinical Social Worker

## 2021-02-04 DIAGNOSIS — Z803 Family history of malignant neoplasm of breast: Secondary | ICD-10-CM

## 2021-02-04 DIAGNOSIS — Z1379 Encounter for other screening for genetic and chromosomal anomalies: Secondary | ICD-10-CM | POA: Insufficient documentation

## 2021-02-04 DIAGNOSIS — C50919 Malignant neoplasm of unspecified site of unspecified female breast: Secondary | ICD-10-CM

## 2021-02-04 NOTE — Progress Notes (Signed)
HPI:  Kaitlin Baker was previously seen in the [facility] clinic due to a personal and family history of cancer and concerns regarding a hereditary predisposition to cancer. Please refer to our prior cancer genetics clinic note for more information regarding our discussion, assessment and recommendations, at the time. Kaitlin Baker recent genetic test results were disclosed to her, as were recommendations warranted by these results. These results and recommendations are discussed in more detail below.  CANCER HISTORY:  Oncology History   No history exists.    FAMILY HISTORY:  We obtained a detailed, 4-generation family history.  Significant diagnoses are listed below: Family History  Adopted: Yes  Problem Relation Age of Onset  . Breast cancer Mother        dx 14s  . Brain cancer Father        vs brain tumor, d. 14  . Breast cancer Sister        dx 40  . Breast cancer Sister        dx 55s  . Liver cancer Brother   . Alcohol abuse Brother   . Asthma Son     Kaitlin Baker has a son, 49 and daughter, 43. She was adopted but does have some information about her biological family. She had 10 biological siblings. Two of her sisters died of breast cancer, they were both diagnosed in their 1s. Her mother also died of breast cancer at 39. Her brother had liver cancer. Her father died of a brain tumor at 77. No other known cancers in the family.  Kaitlin Baker is unaware of previous family history of genetic testing for hereditary cancer risks. Patient's maternal ancestors are of unknown descent, and paternal ancestors are of unknown descent. There is no reported Ashkenazi Jewish ancestry. There is no known consanguinity.     GENETIC TEST RESULTS: Genetic testing reported out on 01/31/2021 through the [facility] Multi-Cancer Panel+RNA cancer panel found no pathogenic mutations.   The Multi-Cancer Panel + RNA offered by [facility] includes sequencing and/or deletion duplication testing of the  following 84 genes: AIP, ALK, APC, ATM, AXIN2,BAP1,  BARD1, BLM, BMPR1A, BRCA1, BRCA2, BRIP1, CASR, CDC73, CDH1, CDK4, CDKN1B, CDKN1C, CDKN2A (p14ARF), CDKN2A (p16INK4a), CEBPA, CHEK2, CTNNA1, DICER1, DIS3L2, EGFR (c.2369C>T, p.Thr790Met variant only), EPCAM (Deletion/duplication testing only), FH, FLCN, GATA2, GPC3, GREM1 (Promoter region deletion/duplication testing only), HOXB13 (c.251G>A, p.Gly84Glu), HRAS, KIT, MAX, MEN1, MET, MITF (c.952G>A, p.Glu318Lys variant only), MLH1, MSH2, MSH3, MSH6, MUTYH, NBN, NF1, NF2, NTHL1, PALB2, PDGFRA, PHOX2B, PMS2, POLD1, POLE, POT1, PRKAR1A, PTCH1, PTEN, RAD50, RAD51C, RAD51D, RB1, RECQL4, RET, RUNX1, SDHAF2, SDHA (sequence changes only), SDHB, SDHC, SDHD, SMAD4, SMARCA4, SMARCB1, SMARCE1, STK11, SUFU, TERC, TERT, TMEM127, TP53, TSC1, TSC2, VHL, WRN and WT1.   The test report has been scanned into EPIC and is located under the Molecular Pathology section of the Results Review tab.  A portion of the result report is included below for reference.     We discussed with Kaitlin Baker that because current genetic testing is not perfect, it is possible there may be a gene mutation in one of these genes that current testing cannot detect, but that chance is small.  We also discussed, that there could be another gene that has not yet been discovered, or that we have not yet tested, that is responsible for the cancer diagnoses in the family. It is also possible there is a hereditary cause for the cancer in the family that Kaitlin Baker did not inherit and therefore was  not identified in her testing.  Therefore, it is important to remain in touch with cancer genetics in the future so that we can continue to offer Kaitlin Baker the most up to date genetic testing.   ADDITIONAL GENETIC TESTING: We discussed with Kaitlin Baker that her genetic testing was fairly extensive.  If there are genes identified to increase cancer risk that can be analyzed in the future, we would be happy to discuss and  coordinate this testing at that time.    CANCER SCREENING RECOMMENDATIONS: Kaitlin Baker test result is considered negative (normal).  This means that we have not identified a hereditary cause for her  personal and family history of cancer at this time. Most cancers happen by chance and this negative test suggests that her cancer may fall into this category.    While reassuring, this does not definitively rule out a hereditary predisposition to cancer. It is still possible that there could be genetic mutations that are undetectable by current technology. There could be genetic mutations in genes that have not been tested or identified to increase cancer risk.  Therefore, it is recommended she continue to follow the cancer management and screening guidelines provided by her oncology and primary healthcare provider.   An individual's cancer risk and medical management are not determined by genetic test results alone. Overall cancer risk assessment incorporates additional factors, including personal medical history, family history, and any available genetic information that may result in a personalized plan for cancer prevention and surveillance.  RECOMMENDATIONS FOR FAMILY MEMBERS:  Relatives in this family might be at some increased risk of developing cancer, over the general population risk, simply due to the family history of cancer.  We recommended female relatives in this family have a yearly mammogram beginning at age 40, or 12 years younger than the earliest onset of cancer, an annual clinical breast exam, and perform monthly breast self-exams. Female relatives in this family should also have a gynecological exam as recommended by their primary provider.  All family members should be referred for colonoscopy starting at age 28.    It is also possible there is a hereditary cause for the cancer in Kaitlin Baker's family that she did not inherit and therefore was not identified in her.  Based on Kaitlin Baker's  family history, we recommended siblings' children and potentially paternal relatives have genetic counseling and testing. Kaitlin Baker will let us know if we can be of any assistance in coordinating genetic counseling and/or testing for these family members.  FOLLOW-UP: Lastly, we discussed with Ms. Riso that cancer genetics is a rapidly advancing field and it is possible that new genetic tests will be appropriate for her and/or her family members in the future. We encouraged her to remain in contact with cancer genetics on an annual basis so we can update her personal and family histories and let her know of advances in cancer genetics that may benefit this family.   Our contact number was provided. Ms. Stober questions were answered to her satisfaction, and she knows she is welcome to call us at anytime with additional questions or concerns.   Faith Rogue, MS, [facility] Genetic Counselor [email or web address] Phone: [phone]

## 2021-02-04 NOTE — Telephone Encounter (Signed)
Revealed negative genetic testing.   We discussed that we do not know why she has breast cancer or why there is cancer in the family. It could be due to a different gene that we are not testing, or something our current technology cannot pick up.  It will be important for her to keep in contact with genetics to learn if additional testing may be needed in the future.  

## 2021-02-11 ENCOUNTER — Other Ambulatory Visit (HOSPITAL_COMMUNITY): Payer: Self-pay

## 2021-02-11 DIAGNOSIS — C50919 Malignant neoplasm of unspecified site of unspecified female breast: Secondary | ICD-10-CM

## 2021-02-11 MED ORDER — PALBOCICLIB 100 MG PO TABS: ORAL_TABLET | ORAL | 3 refills | Status: DC

## 2021-02-11 MED ORDER — PALBOCICLIB 100 MG PO TABS
ORAL_TABLET | ORAL | 3 refills | Status: DC
  Filled 2021-02-11: qty 21, fill #0

## 2021-02-11 NOTE — Telephone Encounter (Signed)
Chart reviewed. Ibrance refilled per Dr. Delton Coombes.

## 2021-02-12 ENCOUNTER — Other Ambulatory Visit (HOSPITAL_COMMUNITY): Payer: Self-pay | Admitting: Hematology

## 2021-02-26 ENCOUNTER — Other Ambulatory Visit (HOSPITAL_COMMUNITY): Payer: Self-pay

## 2021-02-26 MED ORDER — OXYCODONE-ACETAMINOPHEN 5-325 MG PO TABS: 1.0000 | ORAL_TABLET | Freq: Four times a day (QID) | ORAL | 0 refills | Status: DC | PRN

## 2021-02-27 ENCOUNTER — Inpatient Hospital Stay (HOSPITAL_COMMUNITY): Payer: Medicare Other | Attending: Hematology

## 2021-02-27 ENCOUNTER — Other Ambulatory Visit: Payer: Self-pay

## 2021-02-27 DIAGNOSIS — C50912 Malignant neoplasm of unspecified site of left female breast: Secondary | ICD-10-CM | POA: Insufficient documentation

## 2021-02-27 DIAGNOSIS — C170 Malignant neoplasm of duodenum: Secondary | ICD-10-CM | POA: Diagnosis not present

## 2021-02-27 DIAGNOSIS — C7951 Secondary malignant neoplasm of bone: Secondary | ICD-10-CM | POA: Diagnosis not present

## 2021-02-27 DIAGNOSIS — Z79811 Long term (current) use of aromatase inhibitors: Secondary | ICD-10-CM | POA: Diagnosis not present

## 2021-02-27 LAB — COMPREHENSIVE METABOLIC PANEL
ALT: 16 U/L (ref 0–44)
AST: 19 U/L (ref 15–41)
Albumin: 4.3 g/dL (ref 3.5–5.0)
Alkaline Phosphatase: 85 U/L (ref 38–126)
Anion gap: 6 (ref 5–15)
BUN: 21 mg/dL (ref 8–23)
CO2: 27 mmol/L (ref 22–32)
Calcium: 9.4 mg/dL (ref 8.9–10.3)
Chloride: 104 mmol/L (ref 98–111)
Creatinine, Ser: 0.92 mg/dL (ref 0.44–1.00)
GFR, Estimated: >60 mL/min (ref >60)
Glucose, Bld: 88 mg/dL (ref 70–99)
Potassium: 4.2 mmol/L (ref 3.5–5.1)
Sodium: 137 mmol/L (ref 135–145)
Total Bilirubin: 0.3 mg/dL (ref 0.3–1.2)
Total Protein: 7.6 g/dL (ref 6.5–8.1)

## 2021-02-27 LAB — CBC WITH DIFFERENTIAL/PLATELET
Abs Immature Granulocytes: 0.01 10*3/uL (ref 0.00–0.07)
Basophils Absolute: 0.1 10*3/uL (ref 0.0–0.1)
Basophils Relative: 2 %
Eosinophils Absolute: 0.1 10*3/uL (ref 0.0–0.5)
Eosinophils Relative: 1 %
HCT: 40.3 % (ref 36.0–46.0)
Hemoglobin: 13.6 g/dL (ref 12.0–15.0)
Immature Granulocytes: 0 %
Lymphocytes Relative: 45 %
Lymphs Abs: 2.3 10*3/uL (ref 0.7–4.0)
MCH: 34.7 pg — ABNORMAL HIGH (ref 26.0–34.0)
MCHC: 33.7 g/dL (ref 30.0–36.0)
MCV: 102.8 fL — ABNORMAL HIGH (ref 80.0–100.0)
Monocytes Absolute: 0.6 10*3/uL (ref 0.1–1.0)
Monocytes Relative: 11 %
Neutro Abs: 2.1 10*3/uL (ref 1.7–7.7)
Neutrophils Relative %: 41 %
Platelets: 198 10*3/uL (ref 150–400)
RBC: 3.92 MIL/uL (ref 3.87–5.11)
RDW: 13.9 % (ref 11.5–15.5)
WBC: 5 10*3/uL (ref 4.0–10.5)
nRBC: 0 % (ref 0.0–0.2)

## 2021-02-28 LAB — CANCER ANTIGEN 15-3: CA 15-3: 23 U/mL (ref 0.0–25.0)

## 2021-03-04 ENCOUNTER — Inpatient Hospital Stay (HOSPITAL_COMMUNITY): Payer: Medicare Other | Admitting: Hematology and Oncology

## 2021-03-06 ENCOUNTER — Ambulatory Visit (HOSPITAL_COMMUNITY): Payer: Medicare Other | Admitting: Hematology

## 2021-03-21 ENCOUNTER — Inpatient Hospital Stay (HOSPITAL_COMMUNITY): Payer: Medicare Other | Attending: Hematology | Admitting: Hematology and Oncology

## 2021-03-21 ENCOUNTER — Other Ambulatory Visit: Payer: Self-pay

## 2021-03-21 ENCOUNTER — Encounter (HOSPITAL_COMMUNITY): Payer: Self-pay | Admitting: Hematology and Oncology

## 2021-03-21 VITALS — BP 138/74 | HR 68 | Temp 98.4°F | Resp 16 | Wt 176.8 lb

## 2021-03-21 DIAGNOSIS — Z79811 Long term (current) use of aromatase inhibitors: Secondary | ICD-10-CM | POA: Insufficient documentation

## 2021-03-21 DIAGNOSIS — Z170 Estrogen receptor positive status [ER+]: Secondary | ICD-10-CM | POA: Insufficient documentation

## 2021-03-21 DIAGNOSIS — C7951 Secondary malignant neoplasm of bone: Secondary | ICD-10-CM

## 2021-03-21 DIAGNOSIS — C50919 Malignant neoplasm of unspecified site of unspecified female breast: Secondary | ICD-10-CM | POA: Diagnosis not present

## 2021-03-21 DIAGNOSIS — C50912 Malignant neoplasm of unspecified site of left female breast: Secondary | ICD-10-CM | POA: Insufficient documentation

## 2021-03-21 NOTE — Progress Notes (Signed)
[facility] [address], [place] [address]   Patient Care Team: Default, Provider, MD as PCP - General  SUMMARY OF ONCOLOGIC HISTORY: Oncology History   No history exists.    CHIEF COMPLIANT: Follow-up for left breast cancer, Ibrance and arimidex.  INTERVAL HISTORY: [name] is a 65 y.o. female here today for follow up of her left breast cancer. She is doing well today, no complaints She has been compliant with ibrance and anastrazole, no adverse effects so far. Appetite and energy levels are good She thinks the breast mass is less prominent.  Rest of the pertinent 10 point ROS reviewed and neg.  I have reviewed the past medical history, past surgical history, social history and family history with the patient and they are unchanged from previous note.   ALLERGIES:   is allergic to codeine.   MEDICATIONS:  Current Outpatient Medications  Medication Sig Dispense Refill   anastrozole (ARIMIDEX) 1 MG tablet Take 1 tablet by mouth once daily 30 tablet 6   oxyCODONE-acetaminophen (PERCOCET/ROXICET) 5-325 MG tablet Take 1 tablet by mouth every 6 (six) hours as needed for moderate pain. 60 tablet 0   palbociclib (IBRANCE) 100 MG tablet TAKE 1 TABLET (100 MG TOTAL) BY MOUTH DAILY. TAKE FOR 21 DAYS ON, 7 DAYS OFF, REPEAT EVERY 28 DAYS. 21 tablet 3   ondansetron (ZOFRAN) 8 MG tablet Take 1 tablet (8 mg total) by mouth every 8 (eight) hours as needed for nausea or vomiting. (Patient not taking: Reported on 03/21/2021) 60 tablet 2   No current facility-administered medications for this visit.     PHYSICAL EXAMINATION: Performance status (ECOG): 2 - Symptomatic, <50% confined to bed  Vitals:   03/21/21 1152  BP: 138/74  Pulse: 68  Resp: 16  Temp: 98.4 F (36.9 C)  SpO2: 99%   Wt Readings from Last 3 Encounters:  03/21/21 176 lb 12.8 oz (80.2 kg)  12/19/20 191 lb 2.2 oz (86.7 kg)  10/16/20 196 lb 6.9 oz (89.1 kg)   Physical Exam Vitals  reviewed.  Constitutional:      Appearance: Normal appearance. She is obese.  Cardiovascular:     Rate and Rhythm: Normal rate and regular rhythm.     Pulses: Normal pulses.     Heart sounds: Normal heart sounds.  Pulmonary:     Effort: Pulmonary effort is normal.     Breath sounds: Normal breath sounds.  Chest:  Breasts:    Right: No axillary adenopathy or supraclavicular adenopathy.     Left: No axillary adenopathy or supraclavicular adenopathy.  Lymphadenopathy:     Upper Body:     Right upper body: No supraclavicular, axillary or pectoral adenopathy.     Left upper body: No supraclavicular, axillary or pectoral adenopathy.  Neurological:     General: No focal deficit present.     Mental Status: She is alert and oriented to person, place, and time.  Psychiatric:        Mood and Affect: Mood normal.        Behavior: Behavior normal.    Breast Exam : Since this is my first visit with her, I dont have a comparison, palpable left breast mass pretty much occupying entire left breast with inverted nipple, palpable left axillary LN   LABORATORY DATA:  I have reviewed the data as listed CMP Latest Ref Rng & Units 02/27/2021 12/16/2020 12/16/2020  Glucose 70 - 99 mg/dL 88 89 -  BUN 8 -  23 mg/dL 21 17 -  Creatinine 0.44 - 1.00 mg/dL 0.92 1.11(H) 1.10(H)  Sodium 135 - 145 mmol/L 137 137 -  Potassium 3.5 - 5.1 mmol/L 4.2 4.5 -  Chloride 98 - 111 mmol/L 104 102 -  CO2 22 - 32 mmol/L 27 26 -  Calcium 8.9 - 10.3 mg/dL 9.4 9.2 -  Total Protein 6.5 - 8.1 g/dL 7.6 7.3 -  Total Bilirubin 0.3 - 1.2 mg/dL 0.3 0.5 -  Alkaline Phos 38 - 126 U/L 85 88 -  AST 15 - 41 U/L 19 16 -  ALT 0 - 44 U/L 16 12 -   Lab Results  Component Value Date   CAN153 23.0 02/27/2021   CAN153 25.5 (H) 12/16/2020   CAN153 30.3 (H) 10/15/2020   Lab Results  Component Value Date   WBC 5.0 02/27/2021   HGB 13.6 02/27/2021   HCT 40.3 02/27/2021   MCV 102.8 (H) 02/27/2021   PLT 198 02/27/2021   NEUTROABS 2.1  02/27/2021    ASSESSMENT:  1.  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  She did not seek medical attention.  She did not have mammograms for the past 30 years. -Reported 26 pound weight loss which was intentional in the last 6 months. -Developed fracture of the proximal left humeral diaphysis when she picked up her cat. -CT humerus left without contrast on 05/23/2020 shows minimally displaced oblique fracture of the proximal left humeral diaphysis with no definitive CT evidence of underlying lesion.  Nonspecific left axillary adenopathy. -Physical exam today reveals left breast mass occupying the majority of the outer quadrant with skin thickening.  Could not palpate the axillary lymph node as she was unable to lift her arm. -PET scan on 06/24/2020 showed irregular hypermetabolic 8.1 x 4.0 left breast mass compatible with malignancy with left axillary nodal metastasis.  Extensive multifocal mixed lytic and sclerotic metastatic disease throughout the axial and proximal appendicular skeleton. -Ultrasound-guided left breast biopsy on 07/11/2020 shows invasive mammary carcinoma, grade 2.  ER/PR 90% positive.  Ki-67 60%.  HER-2 2+ by IHC.  HER-2 negative by FISH. -Anastrozole and palbociclib started on 07/19/2020.   2.  Social/family history: -Lives at home with husband.  Never smoker.  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   PLAN:  1.  Metastatic ER positive left breast cancer to the bones: -She is continuing to tolerate anastrozole daily very well. -She is taking Ibrance 3 weeks on 1 week off without any major problems. -Reviewed CT CAP from 12/16/2020.  Slightly decreased left breast mass and decrease the left axillary lymph node suggestive of response.  Stable skeletal lesions.  No new lesions. She is tolerating current regimen very well. Continue  current regimen, monthly labs, she wants to continue every other month doc appointments. Can consider periodic imaging. CA 15-3, 23.0  2.  Bone metastasis: -We did not start her on denosumab because of poor dentition.  She is unable to see a dentist at this time.   3.  Left humerus pathological fracture: -This has healed completely and range of motion has also significantly improved.   Breast Cancer therapy associated bone loss: I have recommended calcium, Vitamin D and weight bearing exercises.  Benay Pike MD

## 2021-03-21 NOTE — Progress Notes (Signed)
Patient is taking the Anastrozole and Ibrance as prescribed and denies any adverse side effects.

## 2021-03-25 ENCOUNTER — Other Ambulatory Visit (HOSPITAL_COMMUNITY): Payer: Self-pay

## 2021-03-25 DIAGNOSIS — C7951 Secondary malignant neoplasm of bone: Secondary | ICD-10-CM

## 2021-03-25 DIAGNOSIS — C50919 Malignant neoplasm of unspecified site of unspecified female breast: Secondary | ICD-10-CM

## 2021-04-16 ENCOUNTER — Telehealth (HOSPITAL_COMMUNITY): Payer: Self-pay

## 2021-04-16 ENCOUNTER — Other Ambulatory Visit (HOSPITAL_COMMUNITY): Payer: Self-pay | Admitting: Hematology

## 2021-04-16 MED ORDER — OXYCODONE-ACETAMINOPHEN 5-325 MG PO TABS: 1.0000 | ORAL_TABLET | Freq: Four times a day (QID) | ORAL | 0 refills | Status: DC | PRN

## 2021-04-16 NOTE — Telephone Encounter (Signed)
Patient called stating she was out of town longer than expected and needed her pain medication refilled and sent to [facility] in [place].  Dr. Raliegh Ip refilled Oxycodone-acetaminophen 5-325 Mg. And sent to Pharmacy of patients choice.   Patient notified that prescription was sent in and aware.

## 2021-04-18 ENCOUNTER — Other Ambulatory Visit (HOSPITAL_COMMUNITY): Payer: Medicare Other

## 2021-04-24 ENCOUNTER — Other Ambulatory Visit (HOSPITAL_COMMUNITY): Payer: Medicare Other

## 2021-05-05 ENCOUNTER — Other Ambulatory Visit: Payer: Self-pay

## 2021-05-05 ENCOUNTER — Inpatient Hospital Stay (HOSPITAL_COMMUNITY): Payer: Medicare Other | Attending: Hematology

## 2021-05-05 DIAGNOSIS — Z79811 Long term (current) use of aromatase inhibitors: Secondary | ICD-10-CM | POA: Diagnosis not present

## 2021-05-05 DIAGNOSIS — C50912 Malignant neoplasm of unspecified site of left female breast: Secondary | ICD-10-CM | POA: Diagnosis present

## 2021-05-05 DIAGNOSIS — Z170 Estrogen receptor positive status [ER+]: Secondary | ICD-10-CM | POA: Insufficient documentation

## 2021-05-05 DIAGNOSIS — C7951 Secondary malignant neoplasm of bone: Secondary | ICD-10-CM | POA: Diagnosis not present

## 2021-05-05 DIAGNOSIS — C50919 Malignant neoplasm of unspecified site of unspecified female breast: Secondary | ICD-10-CM

## 2021-05-05 LAB — COMPREHENSIVE METABOLIC PANEL
ALT: 13 U/L (ref 0–44)
AST: 17 U/L (ref 15–41)
Albumin: 3.9 g/dL (ref 3.5–5.0)
Alkaline Phosphatase: 79 U/L (ref 38–126)
Anion gap: 8 (ref 5–15)
BUN: 14 mg/dL (ref 8–23)
CO2: 28 mmol/L (ref 22–32)
Calcium: 9 mg/dL (ref 8.9–10.3)
Chloride: 103 mmol/L (ref 98–111)
Creatinine, Ser: 1.03 mg/dL — ABNORMAL HIGH (ref 0.44–1.00)
GFR, Estimated: 60 mL/min — ABNORMAL LOW (ref >60)
Glucose, Bld: 92 mg/dL (ref 70–99)
Potassium: 4 mmol/L (ref 3.5–5.1)
Sodium: 139 mmol/L (ref 135–145)
Total Bilirubin: 0.5 mg/dL (ref 0.3–1.2)
Total Protein: 6.9 g/dL (ref 6.5–8.1)

## 2021-05-05 LAB — CBC WITH DIFFERENTIAL/PLATELET
Abs Immature Granulocytes: 0.02 10*3/uL (ref 0.00–0.07)
Basophils Absolute: 0.1 10*3/uL (ref 0.0–0.1)
Basophils Relative: 1 %
Eosinophils Absolute: 0.1 10*3/uL (ref 0.0–0.5)
Eosinophils Relative: 2 %
HCT: 37.6 % (ref 36.0–46.0)
Hemoglobin: 13 g/dL (ref 12.0–15.0)
Immature Granulocytes: 1 %
Lymphocytes Relative: 41 %
Lymphs Abs: 1.4 10*3/uL (ref 0.7–4.0)
MCH: 35.4 pg — ABNORMAL HIGH (ref 26.0–34.0)
MCHC: 34.6 g/dL (ref 30.0–36.0)
MCV: 102.5 fL — ABNORMAL HIGH (ref 80.0–100.0)
Monocytes Absolute: 0.2 10*3/uL (ref 0.1–1.0)
Monocytes Relative: 6 %
Neutro Abs: 1.7 10*3/uL (ref 1.7–7.7)
Neutrophils Relative %: 49 %
Platelets: 293 10*3/uL (ref 150–400)
RBC: 3.67 MIL/uL — ABNORMAL LOW (ref 3.87–5.11)
RDW: 13.4 % (ref 11.5–15.5)
WBC: 3.5 10*3/uL — ABNORMAL LOW (ref 4.0–10.5)
nRBC: 0 % (ref 0.0–0.2)

## 2021-05-06 LAB — CANCER ANTIGEN 15-3: CA 15-3: 19.4 U/mL (ref 0.0–25.0)

## 2021-05-08 ENCOUNTER — Other Ambulatory Visit (HOSPITAL_COMMUNITY): Payer: Self-pay

## 2021-05-08 DIAGNOSIS — C50919 Malignant neoplasm of unspecified site of unspecified female breast: Secondary | ICD-10-CM

## 2021-05-08 MED ORDER — PALBOCICLIB 100 MG PO TABS: ORAL_TABLET | ORAL | 3 refills | Status: DC

## 2021-05-08 NOTE — Telephone Encounter (Signed)
Chart reviewed. Ibrance refilled per last office visit with Dr. Chryl Heck.

## 2021-05-20 NOTE — Progress Notes (Signed)
[facility] [address], [place] [address]   Patient Care Team: Default, Provider, MD as PCP - General  CHIEF COMPLIANT: Follow-up for left breast cancer   INTERVAL HISTORY: [name] is a 65 y.o. female here today for follow up of her left breast cancer. Her last visit was on 12/19/2020.   Today she reports feeling good. She denies any new or significant joint pains, n/v, severe fatigue. She has pain in her right buttock but reports this may be due to over exertion.   REVIEW OF SYSTEMS:   Review of Systems  Constitutional:  Negative for appetite change (50%) and fatigue.  Gastrointestinal:  Negative for nausea and vomiting.  Musculoskeletal:  Positive for myalgias (4/10 R buttock). Negative for arthralgias.  Neurological:  Positive for dizziness.  All other systems reviewed and are negative.  I have reviewed the past medical history, past surgical history, social history and family history with the patient and they are unchanged from previous note.   ALLERGIES:   is allergic to codeine.   MEDICATIONS:  Current Outpatient Medications  Medication Sig Dispense Refill   anastrozole (ARIMIDEX) 1 MG tablet Take 1 tablet by mouth once daily 30 tablet 6   ondansetron (ZOFRAN) 8 MG tablet Take 1 tablet (8 mg total) by mouth every 8 (eight) hours as needed for nausea or vomiting. (Patient not taking: Reported on 03/21/2021) 60 tablet 2   oxyCODONE-acetaminophen (PERCOCET/ROXICET) 5-325 MG tablet Take 1 tablet by mouth every 6 (six) hours as needed for moderate pain. 60 tablet 0   palbociclib (IBRANCE) 100 MG tablet TAKE 1 TABLET (100 MG TOTAL) BY MOUTH DAILY. TAKE FOR 21 DAYS ON, 7 DAYS OFF, REPEAT EVERY 28 DAYS. 21 tablet 3   No current facility-administered medications for this visit.     PHYSICAL EXAMINATION: Performance status (ECOG): 2 - Symptomatic, <50% confined to bed  There were no vitals filed for this visit. Wt Readings from Last 3  Encounters:  03/21/21 176 lb 12.8 oz (80.2 kg)  12/19/20 191 lb 2.2 oz (86.7 kg)  10/16/20 196 lb 6.9 oz (89.1 kg)   Physical Exam Vitals reviewed.  Constitutional:      Appearance: Normal appearance.  Cardiovascular:     Rate and Rhythm: Normal rate and regular rhythm.     Pulses: Normal pulses.     Heart sounds: Normal heart sounds.  Pulmonary:     Effort: Pulmonary effort is normal.     Breath sounds: Normal breath sounds.  Neurological:     General: No focal deficit present.     Mental Status: She is alert and oriented to person, place, and time.  Psychiatric:        Mood and Affect: Mood normal.        Behavior: Behavior normal.    Breast Exam Chaperone: Thana Ates     LABORATORY DATA:  I have reviewed the data as listed CMP Latest Ref Rng & Units 05/05/2021 02/27/2021 12/16/2020  Glucose 70 - 99 mg/dL 92 88 89  BUN 8 - 23 mg/dL _0 Creatinine 0.44 - 1.00 mg/dL 1.03(H) 0.92 1.11(H)  Sodium 135 - 145 mmol/L 139 137 137  Potassium 3.5 - 5.1 mmol/L 4.0 4.2 4.5  Chloride 98 - 111 mmol/L 103 104 102  CO2 22 - 32 mmol/L _1 Calcium 8.9 - 10.3 mg/dL 9.0 9.4 9.2  Total Protein 6.5 - 8.1 g/dL 6.9 7.6 7.3  Total Bilirubin  0.3 - 1.2 mg/dL 0.5 0.3 0.5  Alkaline Phos 38 - 126 U/L 79 85 88  AST 15 - 41 U/L _0 ALT 0 - 44 U/L _1 Lab Results  Component Value Date   CAN153 19.4 05/05/2021   CAN153 23.0 02/27/2021   CAN153 25.5 (H) 12/16/2020   Lab Results  Component Value Date   WBC 3.5 (L) 05/05/2021   HGB 13.0 05/05/2021   HCT 37.6 05/05/2021   MCV 102.5 (H) 05/05/2021   PLT 293 05/05/2021   NEUTROABS 1.7 05/05/2021    ASSESSMENT:  1.  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  She did not seek medical attention.  She did not have mammograms for the past 30 years. -Reported 26 pound weight loss which was intentional in the last 6 months. -Developed fracture  of the proximal left humeral diaphysis when she picked up her cat. -CT humerus left without contrast on 05/23/2020 shows minimally displaced oblique fracture of the proximal left humeral diaphysis with no definitive CT evidence of underlying lesion.  Nonspecific left axillary adenopathy. -Physical exam today reveals left breast mass occupying the majority of the outer quadrant with skin thickening.  Could not palpate the axillary lymph node as she was unable to lift her arm. -PET scan on 06/24/2020 showed irregular hypermetabolic 8.1 x 4.0 left breast mass compatible with malignancy with left axillary nodal metastasis.  Extensive multifocal mixed lytic and sclerotic metastatic disease throughout the axial and proximal appendicular skeleton. -Ultrasound-guided left breast biopsy on 07/11/2020 shows invasive mammary carcinoma, grade 2.  ER/PR 90% positive.  Ki-67 60%.  HER-2 2+ by IHC.  HER-2 negative by FISH. -Anastrozole and palbociclib started on 07/19/2020.   2.  Social/family history: -Lives at home with husband.  Never smoker.  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     PLAN:  1.  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 - Last CT CAP from 12/16/2020 showed slightly decreased irregular left breast mass and decrease in size of left axillary lymph nodes.  No significant change in extensive multifocal lytic or blastic metastatic disease throughout axial and appendicular skeletal system. - Similar appearance of low-density lesion in thyroid isthmus which measures 2.1 x 1.9 x 2.5 cm. - CA 15-3 has normalized to 19.4.  Rest of the labs show normal LFTs.  White count is slightly low secondary to Ibrance with normal ANC. - Continue Ibrance 100 mg 3 weeks on 1 week off. - She reported right buttock pain on and off.  She had metastatic disease in the bones at this area. - I  have recommended follow-up in 2 months with repeat labs.  We will also do CT CAP with contrast.   2.  Bone metastasis: - Because of poor dentition, we did not start denosumab even though we talked about it. - I have counseled her about the importance of denosumab to reduce skeletal related events.  She was again asked to see dentist for clearance.   3.  Right shoulder blade pain: - She takes Percocet at bedtime as needed.  I have sent a refill.   4.  Family history: - [employer]ae testing was negative for germline mutations.   5.  Nausea/vomiting: - Continue Zofran as needed.   6.  Left  humerus pathological fracture: - This has completely healed and range of motion has significantly improved.  Breast Cancer therapy associated bone loss: I have recommended calcium, Vitamin D and weight bearing exercises.  Orders placed this encounter:  No orders of the defined types were placed in this encounter.   The patient has a good understanding of the overall plan. She agrees with it. She will call with any problems that may develop before the next visit here.  Derek Jack, MD [place] [phone]   I, Thana Ates, am acting as a scribe for Dr. Derek Jack.  I, Derek Jack MD, have reviewed the above documentation for accuracy and completeness, and I agree with the above.

## 2021-05-21 ENCOUNTER — Other Ambulatory Visit (HOSPITAL_COMMUNITY): Payer: Self-pay

## 2021-05-21 ENCOUNTER — Inpatient Hospital Stay (HOSPITAL_COMMUNITY): Payer: Medicare Other | Attending: Hematology | Admitting: Hematology

## 2021-05-21 ENCOUNTER — Other Ambulatory Visit: Payer: Self-pay

## 2021-05-21 VITALS — BP 139/84 | HR 83 | Temp 96.8°F | Resp 18 | Wt 169.0 lb

## 2021-05-21 DIAGNOSIS — Z170 Estrogen receptor positive status [ER+]: Secondary | ICD-10-CM | POA: Diagnosis not present

## 2021-05-21 DIAGNOSIS — Z79899 Other long term (current) drug therapy: Secondary | ICD-10-CM | POA: Diagnosis not present

## 2021-05-21 DIAGNOSIS — C7951 Secondary malignant neoplasm of bone: Secondary | ICD-10-CM | POA: Diagnosis not present

## 2021-05-21 DIAGNOSIS — Z79811 Long term (current) use of aromatase inhibitors: Secondary | ICD-10-CM | POA: Diagnosis not present

## 2021-05-21 DIAGNOSIS — E559 Vitamin D deficiency, unspecified: Secondary | ICD-10-CM

## 2021-05-21 DIAGNOSIS — C50912 Malignant neoplasm of unspecified site of left female breast: Secondary | ICD-10-CM | POA: Diagnosis present

## 2021-05-21 MED ORDER — OXYCODONE-ACETAMINOPHEN 5-325 MG PO TABS: 1.0000 | ORAL_TABLET | Freq: Two times a day (BID) | ORAL | 0 refills | Status: DC | PRN

## 2021-05-21 NOTE — Patient Instructions (Addendum)
[place] at [facility] Discharge Instructions  You were seen today by Dr. Delton Coombes. He went over your recent results. You will be scheduled for a CT scan of your chest, abdomen, and pelvis prior to your next visit. Dr. Delton Coombes will see you back in 2 months for labs and follow up.   Thank you for choosing [place] at [facility] to provide your oncology and hematology care.  To afford each patient quality time with our provider, please arrive at least 15 minutes before your scheduled appointment time.   If you have a lab appointment with the [facility] please come in thru the Main Entrance and check in at the main information desk  You need to re-schedule your appointment should you arrive 10 or more minutes late.  We strive to give you quality time with our providers, and arriving late affects you and other patients whose appointments are after yours.  Also, if you no show three or more times for appointments you may be dismissed from the clinic at the providers discretion.     Again, thank you for choosing [facility].  Our hope is that these requests will decrease the amount of time that you wait before being seen by our physicians.       _____________________________________________________________  Should you have questions after your visit to [facility], please contact our office at [phone] between the hours of 8:00 a.m. and 4:30 p.m.  Voicemails left after 4:00 p.m. will not be returned until the following business day.  For prescription refill requests, have your pharmacy contact our office and allow 72 hours.    Cancer Center Support Programs:   > Cancer Support Group  2nd Tuesday of the month 1pm-2pm, Journey Room

## 2021-05-21 NOTE — Progress Notes (Signed)
Patient is taking anastrozole as prescribed.  They have not missed any doses and report no side effects at this time.   

## 2021-07-08 ENCOUNTER — Other Ambulatory Visit (HOSPITAL_COMMUNITY): Payer: Self-pay

## 2021-07-09 ENCOUNTER — Other Ambulatory Visit (HOSPITAL_COMMUNITY): Payer: Self-pay

## 2021-07-16 ENCOUNTER — Other Ambulatory Visit (HOSPITAL_COMMUNITY): Payer: Self-pay

## 2021-07-16 MED ORDER — OXYCODONE-ACETAMINOPHEN 5-325 MG PO TABS: 1.0000 | ORAL_TABLET | Freq: Two times a day (BID) | ORAL | 0 refills | Status: DC | PRN

## 2021-07-24 ENCOUNTER — Inpatient Hospital Stay (HOSPITAL_COMMUNITY): Payer: Medicare Other | Attending: Hematology

## 2021-07-24 ENCOUNTER — Ambulatory Visit (HOSPITAL_COMMUNITY)
Admission: RE | Admit: 2021-07-24 | Discharge: 2021-07-24 | Disposition: A | Discharge status: Home / Self Care | Payer: Medicare Other | Source: Ambulatory Visit | Attending: Hematology | Admitting: Hematology

## 2021-07-24 ENCOUNTER — Other Ambulatory Visit: Payer: Self-pay

## 2021-07-24 DIAGNOSIS — Z79811 Long term (current) use of aromatase inhibitors: Secondary | ICD-10-CM | POA: Insufficient documentation

## 2021-07-24 DIAGNOSIS — C50912 Malignant neoplasm of unspecified site of left female breast: Secondary | ICD-10-CM | POA: Diagnosis present

## 2021-07-24 DIAGNOSIS — C7951 Secondary malignant neoplasm of bone: Secondary | ICD-10-CM | POA: Insufficient documentation

## 2021-07-24 DIAGNOSIS — Z170 Estrogen receptor positive status [ER+]: Secondary | ICD-10-CM | POA: Insufficient documentation

## 2021-07-24 DIAGNOSIS — E559 Vitamin D deficiency, unspecified: Secondary | ICD-10-CM | POA: Insufficient documentation

## 2021-07-24 DIAGNOSIS — Z79899 Other long term (current) drug therapy: Secondary | ICD-10-CM | POA: Insufficient documentation

## 2021-07-24 DIAGNOSIS — E041 Nontoxic single thyroid nodule: Secondary | ICD-10-CM | POA: Insufficient documentation

## 2021-07-24 LAB — CBC WITH DIFFERENTIAL/PLATELET
Abs Immature Granulocytes: 0.01 10*3/uL (ref 0.00–0.07)
Basophils Absolute: 0.1 10*3/uL (ref 0.0–0.1)
Basophils Relative: 2 %
Eosinophils Absolute: 0 10*3/uL (ref 0.0–0.5)
Eosinophils Relative: 1 %
HCT: 40.1 % (ref 36.0–46.0)
Hemoglobin: 13.8 g/dL (ref 12.0–15.0)
Immature Granulocytes: 0 %
Lymphocytes Relative: 41 %
Lymphs Abs: 1.8 10*3/uL (ref 0.7–4.0)
MCH: 35.1 pg — ABNORMAL HIGH (ref 26.0–34.0)
MCHC: 34.4 g/dL (ref 30.0–36.0)
MCV: 102 fL — ABNORMAL HIGH (ref 80.0–100.0)
Monocytes Absolute: 0.2 10*3/uL (ref 0.1–1.0)
Monocytes Relative: 5 %
Neutro Abs: 2.3 10*3/uL (ref 1.7–7.7)
Neutrophils Relative %: 51 %
Platelets: 287 10*3/uL (ref 150–400)
RBC: 3.93 MIL/uL (ref 3.87–5.11)
RDW: 13.1 % (ref 11.5–15.5)
WBC: 4.5 10*3/uL (ref 4.0–10.5)
nRBC: 0 % (ref 0.0–0.2)

## 2021-07-24 LAB — COMPREHENSIVE METABOLIC PANEL
ALT: 16 U/L (ref 0–44)
AST: 19 U/L (ref 15–41)
Albumin: 4.4 g/dL (ref 3.5–5.0)
Alkaline Phosphatase: 72 U/L (ref 38–126)
Anion gap: 8 (ref 5–15)
BUN: 22 mg/dL (ref 8–23)
CO2: 29 mmol/L (ref 22–32)
Calcium: 9.5 mg/dL (ref 8.9–10.3)
Chloride: 100 mmol/L (ref 98–111)
Creatinine, Ser: 1.12 mg/dL — ABNORMAL HIGH (ref 0.44–1.00)
GFR, Estimated: 54 mL/min — ABNORMAL LOW (ref >60)
Glucose, Bld: 94 mg/dL (ref 70–99)
Potassium: 4.1 mmol/L (ref 3.5–5.1)
Sodium: 137 mmol/L (ref 135–145)
Total Bilirubin: 0.5 mg/dL (ref 0.3–1.2)
Total Protein: 7.5 g/dL (ref 6.5–8.1)

## 2021-07-24 LAB — VITAMIN D 25 HYDROXY (VIT D DEFICIENCY, FRACTURES): Vit D, 25-Hydroxy: 27.54 ng/mL — ABNORMAL LOW (ref 30–100)

## 2021-07-24 MED ORDER — IOHEXOL 300 MG/ML  SOLN
100.0000 mL | Freq: Once | INTRAMUSCULAR | Status: AC | PRN
  Administered 2021-07-24: 100 mL via INTRAVENOUS

## 2021-07-25 LAB — CANCER ANTIGEN 15-3: CA 15-3: 22.4 U/mL (ref 0.0–25.0)

## 2021-07-29 NOTE — Progress Notes (Signed)
[place] [address], [place] [address]   Patient Care Team: Default, Provider, MD as PCP - General  SUMMARY OF ONCOLOGIC HISTORY: Oncology History   No history exists.    CHIEF COMPLIANT: Follow-up for left breast cancer   INTERVAL HISTORY: [name] is a 65 y.o. female here today for follow up of her left breast cancer. Her last visit was on 05/21/2021.   Today she reports feeling good. She reports stable chronic occasional right buttock pain. She denies hot flashes. She is not currently taking vitamin D. She denies nausea and vomiting. She takes 0-1 percocet tablet daily as needed. She denies any new pains and ankle swellings. She is able to do all her typical activities without difficulty.   REVIEW OF SYSTEMS:   Review of Systems  Constitutional:  Negative for appetite change and fatigue.  Cardiovascular:  Negative for leg swelling.  Gastrointestinal:  Negative for nausea and vomiting.  Endocrine: Positive for hot flashes.  Musculoskeletal:  Positive for myalgias (R buttock).  All other systems reviewed and are negative.  I have reviewed the past medical history, past surgical history, social history and family history with the patient and they are unchanged from previous note.   ALLERGIES:   is allergic to codeine.   MEDICATIONS:  Current Outpatient Medications  Medication Sig Dispense Refill   anastrozole (ARIMIDEX) 1 MG tablet Take 1 tablet by mouth once daily 30 tablet 6   palbociclib (IBRANCE) 100 MG tablet TAKE 1 TABLET (100 MG TOTAL) BY MOUTH DAILY. TAKE FOR 21 DAYS ON, 7 DAYS OFF, REPEAT EVERY 28 DAYS. 21 tablet 3   ondansetron (ZOFRAN) 8 MG tablet Take 1 tablet (8 mg total) by mouth every 8 (eight) hours as needed for nausea or vomiting. (Patient not taking: Reported on 07/30/2021) 60 tablet 2   oxyCODONE-acetaminophen (PERCOCET/ROXICET) 5-325 MG tablet Take 1 tablet by mouth every 12 (twelve) hours as needed for moderate pain.  (Patient not taking: Reported on 07/30/2021) 60 tablet 0   No current facility-administered medications for this visit.     PHYSICAL EXAMINATION: Performance status (ECOG): 2 - Symptomatic, <50% confined to bed  Vitals:   07/30/21 0810  BP: (!) 148/90  Pulse: 86  Resp: 18  Temp: 98.1 F (36.7 C)  SpO2: 97%   Wt Readings from Last 3 Encounters:  07/30/21 171 lb 9.6 oz (77.8 kg)  05/21/21 169 lb (76.7 kg)  03/21/21 176 lb 12.8 oz (80.2 kg)   Physical Exam Vitals reviewed.  Constitutional:      Appearance: Normal appearance.  Cardiovascular:     Rate and Rhythm: Normal rate and regular rhythm.     Pulses: Normal pulses.     Heart sounds: Normal heart sounds.  Pulmonary:     Effort: Pulmonary effort is normal.     Breath sounds: Normal breath sounds.  Neurological:     General: No focal deficit present.     Mental Status: She is alert and oriented to person, place, and time.  Psychiatric:        Mood and Affect: Mood normal.        Behavior: Behavior normal.    Breast Exam Chaperone: Thana Ates     LABORATORY DATA:  I have reviewed the data as listed CMP Latest Ref Rng & Units 07/24/2021 05/05/2021 02/27/2021  Glucose 70 - 99 mg/dL 94 92 88  BUN 8 - 23 mg/dL $Remove'22 14 21  'myKjDga$ Creatinine 0.44 - 1.00  mg/dL 1.12(H) 1.03(H) 0.92  Sodium 135 - 145 mmol/L 137 139 137  Potassium 3.5 - 5.1 mmol/L 4.1 4.0 4.2  Chloride 98 - 111 mmol/L 100 103 104  CO2 22 - 32 mmol/L $RemoveB'29 28 27  'GEoptKlz$ Calcium 8.9 - 10.3 mg/dL 9.5 9.0 9.4  Total Protein 6.5 - 8.1 g/dL 7.5 6.9 7.6  Total Bilirubin 0.3 - 1.2 mg/dL 0.5 0.5 0.3  Alkaline Phos 38 - 126 U/L 72 79 85  AST 15 - 41 U/L $Remo'19 17 19  'zJrcr$ ALT 0 - 44 U/L $Remo'16 13 16   'UJdjf$ Lab Results  Component Value Date   CAN153 22.4 07/24/2021   CAN153 19.4 05/05/2021   CAN153 23.0 02/27/2021   Lab Results  Component Value Date   WBC 4.5 07/24/2021   HGB 13.8 07/24/2021   HCT 40.1 07/24/2021   MCV 102.0 (H) 07/24/2021   PLT 287 07/24/2021   NEUTROABS 2.3  07/24/2021    ASSESSMENT:  1.  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  She did not seek medical attention.  She did not have mammograms for the past 30 years. -Reported 26 pound weight loss which was intentional in the last 6 months. -Developed fracture of the proximal left humeral diaphysis when she picked up her cat. -CT humerus left without contrast on 05/23/2020 shows minimally displaced oblique fracture of the proximal left humeral diaphysis with no definitive CT evidence of underlying lesion.  Nonspecific left axillary adenopathy. -Physical exam today reveals left breast mass occupying the majority of the outer quadrant with skin thickening.  Could not palpate the axillary lymph node as she was unable to lift her arm. -PET scan on 06/24/2020 showed irregular hypermetabolic 8.1 x 4.0 left breast mass compatible with malignancy with left axillary nodal metastasis.  Extensive multifocal mixed lytic and sclerotic metastatic disease throughout the axial and proximal appendicular skeleton. -Ultrasound-guided left breast biopsy on 07/11/2020 shows invasive mammary carcinoma, grade 2.  ER/PR 90% positive.  Ki-67 60%.  HER-2 2+ by IHC.  HER-2 negative by FISH. -Anastrozole and palbociclib started on 07/19/2020. - Germline mutation testing was negative.   2.  Social/family history: -Lives at home with husband.  Never smoker.  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   PLAN:  1.  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  Widespread bony metastatic disease without substantial change.  2.9 cm thyroid nodule in the isthmus. - Reviewed labs from 07/24/2021 which showed normal  LFTs.  CBC was normal. - Creatinine was mildly elevated.  Recommended 2 to 3 L of water intake. - RTC 3 months with repeat labs and physical exam.  Consider repeating scan in 6 months.   2.  Bone metastasis: - Because of poor dentition we did not start denosumab.   3.  Right shoulder blade pain: - Continue Percocet at bedtime as needed.   4.  Low vitamin D levels: - Recommend starting vitamin D 1000 units daily.   5.  Nausea/vomiting: - Continue Zofran as needed.   6.  Left humerus pathological fracture: - This has completely healed with complete range of motion achieved.  Breast Cancer therapy associated bone loss: I have recommended calcium, Vitamin D and  weight bearing exercises.  Orders placed this encounter:  No orders of the defined types were placed in this encounter.   The patient has a good understanding of the overall plan. She agrees with it. She will call with any problems that may develop before the next visit here.  Derek Jack, MD [place] [phone]   I, Thana Ates, am acting as a scribe for Dr. Derek Jack.  I, Derek Jack MD, have reviewed the above documentation for accuracy and completeness, and I agree with the above.

## 2021-07-30 ENCOUNTER — Other Ambulatory Visit: Payer: Self-pay

## 2021-07-30 ENCOUNTER — Inpatient Hospital Stay (HOSPITAL_BASED_OUTPATIENT_CLINIC_OR_DEPARTMENT_OTHER): Payer: Medicare Other | Admitting: Hematology

## 2021-07-30 VITALS — BP 148/90 | HR 86 | Temp 98.1°F | Resp 18 | Wt 171.6 lb

## 2021-07-30 DIAGNOSIS — C7951 Secondary malignant neoplasm of bone: Secondary | ICD-10-CM | POA: Diagnosis not present

## 2021-07-30 DIAGNOSIS — E041 Nontoxic single thyroid nodule: Secondary | ICD-10-CM | POA: Diagnosis not present

## 2021-07-30 DIAGNOSIS — C50912 Malignant neoplasm of unspecified site of left female breast: Secondary | ICD-10-CM

## 2021-07-30 DIAGNOSIS — Z79811 Long term (current) use of aromatase inhibitors: Secondary | ICD-10-CM | POA: Diagnosis not present

## 2021-07-30 DIAGNOSIS — Z79899 Other long term (current) drug therapy: Secondary | ICD-10-CM | POA: Diagnosis not present

## 2021-07-30 DIAGNOSIS — E559 Vitamin D deficiency, unspecified: Secondary | ICD-10-CM

## 2021-07-30 DIAGNOSIS — Z170 Estrogen receptor positive status [ER+]: Secondary | ICD-10-CM

## 2021-07-30 NOTE — Patient Instructions (Addendum)
[place] at [facility] Discharge Instructions  You were seen and examined today by Dr. Delton Coombes.  Your vitamin D level was low - start taking over the counter Vitamin D 1000 units daily.  Your lab work and scans were reviewed and were stable/normal.  Your kidney function number was a little high - try to drink plenty of water, 64 oz per day.   Return as scheduled for lab work and office visit.    Thank you for choosing [place] at [facility] to provide your oncology and hematology care.  To afford each patient quality time with our provider, please arrive at least 15 minutes before your scheduled appointment time.   If you have a lab appointment with the [place] please come in thru the Main Entrance and check in at the main information desk.  You need to re-schedule your appointment should you arrive 10 or more minutes late.  We strive to give you quality time with our providers, and arriving late affects you and other patients whose appointments are after yours.  Also, if you no show three or more times for appointments you may be dismissed from the clinic at the providers discretion.     Again, thank you for choosing [facility].  Our hope is that these requests will decrease the amount of time that you wait before being seen by our physicians.       _____________________________________________________________  Should you have questions after your visit to [facility], please contact our office at [phone] and follow the prompts.  Our office hours are 8:00 a.m. and 4:30 p.m. Monday - Friday.  Please note that voicemails left after 4:00 p.m. may not be returned until the following business day.  We are closed weekends and major holidays.  You do have access to a nurse 24-7, just call the main number to the clinic [phone] and do not press any options, hold on the line and a nurse will answer the  phone.    For prescription refill requests, have your pharmacy contact our office and allow 72 hours.    Due to Covid, you will need to wear a mask upon entering the hospital. If you do not have a mask, a mask will be given to you at the Main Entrance upon arrival. For doctor visits, patients may have 1 support person age 76 or older with them. For treatment visits, patients can not have anyone with them due to social distancing guidelines and our immunocompromised population.

## 2021-07-30 NOTE — Progress Notes (Signed)
Patient is taking Ibrance as prescribed.  She has not missed any doses and reports no side effects at this time.   

## 2021-08-05 ENCOUNTER — Other Ambulatory Visit (HOSPITAL_COMMUNITY): Payer: Self-pay | Admitting: Hematology

## 2021-08-25 ENCOUNTER — Other Ambulatory Visit (HOSPITAL_COMMUNITY): Payer: Self-pay | Admitting: Physician Assistant

## 2021-08-25 ENCOUNTER — Other Ambulatory Visit (HOSPITAL_COMMUNITY): Payer: Self-pay

## 2021-08-25 DIAGNOSIS — G893 Neoplasm related pain (acute) (chronic): Secondary | ICD-10-CM

## 2021-08-25 DIAGNOSIS — C7951 Secondary malignant neoplasm of bone: Secondary | ICD-10-CM

## 2021-08-25 MED ORDER — OXYCODONE-ACETAMINOPHEN 5-325 MG PO TABS: 1.0000 | ORAL_TABLET | Freq: Two times a day (BID) | ORAL | 0 refills | Status: DC | PRN

## 2021-08-26 ENCOUNTER — Telehealth (HOSPITAL_COMMUNITY): Payer: Self-pay | Admitting: Pharmacy Technician

## 2021-08-26 ENCOUNTER — Other Ambulatory Visit (HOSPITAL_COMMUNITY): Payer: Self-pay

## 2021-08-26 DIAGNOSIS — C50919 Malignant neoplasm of unspecified site of unspecified female breast: Secondary | ICD-10-CM

## 2021-08-26 MED ORDER — PALBOCICLIB 100 MG PO TABS: ORAL_TABLET | ORAL | 3 refills | Status: DC

## 2021-08-31 ENCOUNTER — Encounter (HOSPITAL_COMMUNITY): Payer: Self-pay

## 2021-09-01 ENCOUNTER — Other Ambulatory Visit (HOSPITAL_COMMUNITY): Payer: Self-pay | Admitting: Hematology

## 2021-09-01 DIAGNOSIS — G893 Neoplasm related pain (acute) (chronic): Secondary | ICD-10-CM

## 2021-09-01 DIAGNOSIS — C7951 Secondary malignant neoplasm of bone: Secondary | ICD-10-CM

## 2021-09-01 MED ORDER — OXYCODONE-ACETAMINOPHEN 5-325 MG PO TABS: 1.0000 | ORAL_TABLET | Freq: Two times a day (BID) | ORAL | 0 refills | Status: DC | PRN

## 2021-09-02 ENCOUNTER — Other Ambulatory Visit (HOSPITAL_COMMUNITY): Payer: Self-pay | Admitting: *Deleted

## 2021-09-02 ENCOUNTER — Encounter (HOSPITAL_COMMUNITY): Payer: Self-pay | Admitting: *Deleted

## 2021-09-02 DIAGNOSIS — C7951 Secondary malignant neoplasm of bone: Secondary | ICD-10-CM

## 2021-09-02 DIAGNOSIS — G893 Neoplasm related pain (acute) (chronic): Secondary | ICD-10-CM

## 2021-09-02 MED ORDER — OXYCODONE-ACETAMINOPHEN 5-325 MG PO TABS: 1.0000 | ORAL_TABLET | Freq: Two times a day (BID) | ORAL | 0 refills | Status: DC | PRN

## 2021-09-29 NOTE — Telephone Encounter (Signed)
[name] Encounter  Received notification from [facility]  that patient has been successfully enrolled into their program to receive Ibrance from the manufacturer at $0 out of pocket until 09/13/22.    Specialty Pharmacy that will dispense medication is [facility].  Patient knows to call the office with questions or concerns.   Oral Oncology Clinic will continue to follow.  Kaitlin Baker Patient [place] Phone [phone] Fax [phone] 09/29/2021 12:37 PM

## 2021-09-29 NOTE — Telephone Encounter (Signed)
[name] Encounter  Received communication from [facility] that the patient's eligibility in the patient assistance program for [name] was due for re-enrollment.    The renewal application was faxed to [phone] on 08/26/21.     [place] Patient Kaitlin Baker Phone [phone] Fax [phone] 09/29/2021 12:37 PM

## 2021-10-17 ENCOUNTER — Other Ambulatory Visit (HOSPITAL_COMMUNITY): Payer: Self-pay

## 2021-10-17 ENCOUNTER — Encounter (HOSPITAL_COMMUNITY): Payer: Self-pay

## 2021-10-17 ENCOUNTER — Other Ambulatory Visit: Payer: Self-pay | Admitting: *Deleted

## 2021-10-17 DIAGNOSIS — G893 Neoplasm related pain (acute) (chronic): Secondary | ICD-10-CM

## 2021-10-17 DIAGNOSIS — C7951 Secondary malignant neoplasm of bone: Secondary | ICD-10-CM

## 2021-10-17 DIAGNOSIS — C50919 Malignant neoplasm of unspecified site of unspecified female breast: Secondary | ICD-10-CM

## 2021-10-17 MED ORDER — PALBOCICLIB 100 MG PO TABS: ORAL_TABLET | ORAL | 3 refills | Status: DC

## 2021-10-17 MED ORDER — OXYCODONE-ACETAMINOPHEN 5-325 MG PO TABS: 1.0000 | ORAL_TABLET | Freq: Two times a day (BID) | ORAL | 0 refills | Status: DC | PRN

## 2021-10-17 NOTE — Telephone Encounter (Signed)
Per Dr Miquel Dunn, patient is to continue on Ibrance and has been sent to her pharmacy for refill.

## 2021-11-04 ENCOUNTER — Inpatient Hospital Stay (HOSPITAL_COMMUNITY): Payer: Medicare Other | Attending: Hematology

## 2021-11-04 ENCOUNTER — Other Ambulatory Visit: Payer: Self-pay

## 2021-11-04 DIAGNOSIS — C50912 Malignant neoplasm of unspecified site of left female breast: Secondary | ICD-10-CM | POA: Insufficient documentation

## 2021-11-04 DIAGNOSIS — Z79811 Long term (current) use of aromatase inhibitors: Secondary | ICD-10-CM | POA: Diagnosis not present

## 2021-11-04 DIAGNOSIS — Z170 Estrogen receptor positive status [ER+]: Secondary | ICD-10-CM | POA: Diagnosis not present

## 2021-11-04 DIAGNOSIS — E559 Vitamin D deficiency, unspecified: Secondary | ICD-10-CM | POA: Insufficient documentation

## 2021-11-04 DIAGNOSIS — C7951 Secondary malignant neoplasm of bone: Secondary | ICD-10-CM | POA: Insufficient documentation

## 2021-11-04 DIAGNOSIS — D72819 Decreased white blood cell count, unspecified: Secondary | ICD-10-CM | POA: Diagnosis not present

## 2021-11-04 LAB — CBC WITH DIFFERENTIAL/PLATELET
Abs Immature Granulocytes: 0.02 10*3/uL (ref 0.00–0.07)
Basophils Absolute: 0.1 10*3/uL (ref 0.0–0.1)
Basophils Relative: 2 %
Eosinophils Absolute: 0 10*3/uL (ref 0.0–0.5)
Eosinophils Relative: 1 %
HCT: 38.8 % (ref 36.0–46.0)
Hemoglobin: 13.5 g/dL (ref 12.0–15.0)
Immature Granulocytes: 1 %
Lymphocytes Relative: 44 %
Lymphs Abs: 1.8 10*3/uL (ref 0.7–4.0)
MCH: 35.6 pg — ABNORMAL HIGH (ref 26.0–34.0)
MCHC: 34.8 g/dL (ref 30.0–36.0)
MCV: 102.4 fL — ABNORMAL HIGH (ref 80.0–100.0)
Monocytes Absolute: 0.4 10*3/uL (ref 0.1–1.0)
Monocytes Relative: 10 %
Neutro Abs: 1.7 10*3/uL (ref 1.7–7.7)
Neutrophils Relative %: 42 %
Platelets: 161 10*3/uL (ref 150–400)
RBC: 3.79 MIL/uL — ABNORMAL LOW (ref 3.87–5.11)
RDW: 13.4 % (ref 11.5–15.5)
WBC: 3.9 10*3/uL — ABNORMAL LOW (ref 4.0–10.5)
nRBC: 0 % (ref 0.0–0.2)

## 2021-11-04 LAB — COMPREHENSIVE METABOLIC PANEL
ALT: 14 U/L (ref 0–44)
AST: 18 U/L (ref 15–41)
Albumin: 4.1 g/dL (ref 3.5–5.0)
Alkaline Phosphatase: 64 U/L (ref 38–126)
Anion gap: 7 (ref 5–15)
BUN: 18 mg/dL (ref 8–23)
CO2: 26 mmol/L (ref 22–32)
Calcium: 9.4 mg/dL (ref 8.9–10.3)
Chloride: 105 mmol/L (ref 98–111)
Creatinine, Ser: 0.92 mg/dL (ref 0.44–1.00)
GFR, Estimated: >60 mL/min (ref >60)
Glucose, Bld: 94 mg/dL (ref 70–99)
Potassium: 3.8 mmol/L (ref 3.5–5.1)
Sodium: 138 mmol/L (ref 135–145)
Total Bilirubin: 0.5 mg/dL (ref 0.3–1.2)
Total Protein: 7.1 g/dL (ref 6.5–8.1)

## 2021-11-04 LAB — VITAMIN D 25 HYDROXY (VIT D DEFICIENCY, FRACTURES): Vit D, 25-Hydroxy: 26.98 ng/mL — ABNORMAL LOW (ref 30–100)

## 2021-11-05 LAB — CANCER ANTIGEN 27.29: CA 27.29: 16.8 U/mL (ref 0.0–38.6)

## 2021-11-05 LAB — CANCER ANTIGEN 15-3: CA 15-3: 21.8 U/mL (ref 0.0–25.0)

## 2021-11-11 ENCOUNTER — Other Ambulatory Visit: Payer: Self-pay

## 2021-11-11 ENCOUNTER — Inpatient Hospital Stay (HOSPITAL_BASED_OUTPATIENT_CLINIC_OR_DEPARTMENT_OTHER): Payer: Medicare Other | Admitting: Hematology

## 2021-11-11 VITALS — BP 145/86 | HR 78 | Temp 97.0°F | Resp 18 | Ht 66.0 in | Wt 177.2 lb

## 2021-11-11 DIAGNOSIS — Z170 Estrogen receptor positive status [ER+]: Secondary | ICD-10-CM | POA: Diagnosis not present

## 2021-11-11 DIAGNOSIS — C50912 Malignant neoplasm of unspecified site of left female breast: Secondary | ICD-10-CM | POA: Diagnosis not present

## 2021-11-11 DIAGNOSIS — E559 Vitamin D deficiency, unspecified: Secondary | ICD-10-CM

## 2021-11-11 NOTE — Patient Instructions (Signed)
[place] at [facility] Discharge Instructions   You were seen and examined today by Dr. Delton Coombes.  He reviewed your lab results which are normal/stable.  Your vitamin D was low.  Start taking Vitamin D 1000 units daily.  Return as scheduled in 3 months.    Thank you for choosing [place] at [facility] to provide your oncology and hematology care.  To afford each patient quality time with our provider, please arrive at least 15 minutes before your scheduled appointment time.   If you have a lab appointment with the [facility] please come in thru the Main Entrance and check in at the main information desk.  You need to re-schedule your appointment should you arrive 10 or more minutes late.  We strive to give you quality time with our providers, and arriving late affects you and other patients whose appointments are after yours.  Also, if you no show three or more times for appointments you may be dismissed from the clinic at the providers discretion.     Again, thank you for choosing [facility].  Our hope is that these requests will decrease the amount of time that you wait before being seen by our physicians.       _____________________________________________________________  Should you have questions after your visit to [facility], please contact our office at [phone] and follow the prompts.  Our office hours are 8:00 a.m. and 4:30 p.m. Monday - Friday.  Please note that voicemails left after 4:00 p.m. may not be returned until the following business day.  We are closed weekends and major holidays.  You do have access to a nurse 24-7, just call the main number to the clinic [phone] and do not press any options, hold on the line and a nurse will answer the phone.    For prescription refill requests, have your pharmacy contact our office and allow 72 hours.    Due to Covid, you will need to wear a  mask upon entering the hospital. If you do not have a mask, a mask will be given to you at the Main Entrance upon arrival. For doctor visits, patients may have 1 support person age 70 or older with them. For treatment visits, patients can not have anyone with them due to social distancing guidelines and our immunocompromised population.

## 2021-11-11 NOTE — Progress Notes (Signed)
Patient is taking Ibrance and Anastrozole as prescribed.  She has not missed any doses and reports no side effects at this time other than occasional mouth sore.

## 2021-11-11 NOTE — Progress Notes (Signed)
[facility] [address] [place] [address]   Patient Care Team: Default, Provider, MD as PCP - General  SUMMARY OF ONCOLOGIC HISTORY: Oncology History   No history exists.    CHIEF COMPLIANT: Follow-up for left breast cancer   INTERVAL HISTORY: [name] is a 66 y.o. female here today for follow up of her left breast cancer. Her last visit was on 07/30/2021.   Today she reports feeling good. She is taking anastrozole and Ibrance and is tolerating them well. She reports occasional mouth sores which are resolved with mouth wash. She is not currently taking vitamin D. She takes 1 percocet in the morning prn.   REVIEW OF SYSTEMS:   Review of Systems  Constitutional:  Negative for appetite change and fatigue.  HENT:   Positive for mouth sores.   All other systems reviewed and are negative.  I have reviewed the past medical history, past surgical history, social history and family history with the patient and they are unchanged from previous note.   ALLERGIES:   is allergic to codeine.   MEDICATIONS:  Current Outpatient Medications  Medication Sig Dispense Refill   anastrozole (ARIMIDEX) 1 MG tablet Take 1 tablet by mouth once daily 90 tablet 0   oxyCODONE-acetaminophen (PERCOCET/ROXICET) 5-325 MG tablet Take 1 tablet by mouth every 12 (twelve) hours as needed for moderate pain. 60 tablet 0   palbociclib (IBRANCE) 100 MG tablet TAKE 1 TABLET (100 MG TOTAL) BY MOUTH DAILY. TAKE FOR 21 DAYS ON, 7 DAYS OFF, REPEAT EVERY 28 DAYS. 42 tablet 3   ondansetron (ZOFRAN) 8 MG tablet Take 1 tablet (8 mg total) by mouth every 8 (eight) hours as needed for nausea or vomiting. (Patient not taking: Reported on 11/11/2021) 60 tablet 2   No current facility-administered medications for this visit.     PHYSICAL EXAMINATION: Performance status (ECOG): 2 - Symptomatic, <50% confined to bed  Vitals:   11/11/21 0759  BP: (!) 145/86  Pulse: 78  Resp: 18  Temp:  (!) 97 F (36.1 C)  SpO2: 97%   Wt Readings from Last 3 Encounters:  11/11/21 177 lb 3.2 oz (80.4 kg)  07/30/21 171 lb 9.6 oz (77.8 kg)  05/21/21 169 lb (76.7 kg)   Physical Exam Vitals reviewed.  Constitutional:      Appearance: Normal appearance.  Cardiovascular:     Rate and Rhythm: Normal rate and regular rhythm.     Pulses: Normal pulses.     Heart sounds: Normal heart sounds.  Pulmonary:     Effort: Pulmonary effort is normal.     Breath sounds: Normal breath sounds.  Abdominal:     Palpations: Abdomen is soft. There is no hepatomegaly, splenomegaly or mass.     Tenderness: There is no abdominal tenderness.  Neurological:     General: No focal deficit present.     Mental Status: She is alert and oriented to person, place, and time.  Psychiatric:        Mood and Affect: Mood normal.        Behavior: Behavior normal.    Breast Exam Chaperone: Thana Ates     LABORATORY DATA:  I have reviewed the data as listed CMP Latest Ref Rng & Units 11/04/2021 07/24/2021 05/05/2021  Glucose 70 - 99 mg/dL 94 94 92  BUN 8 - 23 mg/dL '18 22 14  ' Creatinine 0.44 - 1.00 mg/dL 0.92 1.12(H) 1.03(H)  Sodium 135 - 145 mmol/L 138 137  139  Potassium 3.5 - 5.1 mmol/L 3.8 4.1 4.0  Chloride 98 - 111 mmol/L 105 100 103  CO2 22 - 32 mmol/L '26 29 28  ' Calcium 8.9 - 10.3 mg/dL 9.4 9.5 9.0  Total Protein 6.5 - 8.1 g/dL 7.1 7.5 6.9  Total Bilirubin 0.3 - 1.2 mg/dL 0.5 0.5 0.5  Alkaline Phos 38 - 126 U/L 64 72 79  AST 15 - 41 U/L '18 19 17  ' ALT 0 - 44 U/L '14 16 13   ' Lab Results  Component Value Date   CAN153 21.8 11/04/2021   CAN153 22.4 07/24/2021   CAN153 19.4 05/05/2021   Lab Results  Component Value Date   WBC 3.9 (L) 11/04/2021   HGB 13.5 11/04/2021   HCT 38.8 11/04/2021   MCV 102.4 (H) 11/04/2021   PLT 161 11/04/2021   NEUTROABS 1.7 11/04/2021    ASSESSMENT:  1.  Metastatic ER/PR positive left breast cancer to the bones: -She reported feeling the left breast mass for the past  2 years.  She did not seek medical attention.  She did not have mammograms for the past 30 years. -Reported 26 pound weight loss which was intentional in the last 6 months. -Developed fracture of the proximal left humeral diaphysis when she picked up her cat. -CT humerus left without contrast on 05/23/2020 shows minimally displaced oblique fracture of the proximal left humeral diaphysis with no definitive CT evidence of underlying lesion.  Nonspecific left axillary adenopathy. -Physical exam today reveals left breast mass occupying the majority of the outer quadrant with skin thickening.  Could not palpate the axillary lymph node as she was unable to lift her arm. -PET scan on 06/24/2020 showed irregular hypermetabolic 8.1 x 4.0 left breast mass compatible with malignancy with left axillary nodal metastasis.  Extensive multifocal mixed lytic and sclerotic metastatic disease throughout the axial and proximal appendicular skeleton. -Ultrasound-guided left breast biopsy on 07/11/2020 shows invasive mammary carcinoma, grade 2.  ER/PR 90% positive.  Ki-67 60%.  HER-2 2+ by IHC.  HER-2 negative by FISH. -Anastrozole and palbociclib started on 07/19/2020. - Germline mutation testing was negative.   2.  Social/family history: -Lives at home with husband.  Never smoker.  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   PLAN:  1.  Metastatic ER positive left breast cancer to the bones: - CT CAP on 07/24/2021 showed stable disease with similar appearance of irregular left breast lesion with similar left axillary lymph node.  Widespread bony metastatic disease without substantial change.  2.9 cm thyroid nodule in the isthmus. - She is tolerating Ibrance 100 mg 3 weeks on/1 week off very well. - Continue anastrozole 1 mg daily. - Reviewed labs from 11/04/2021.  White count is 3.9 with normal ANC.  Rest of the CBC was normal.  LFTs and creatinine and calcium  are normal.  Tumor marker CA 15-3 and CA 27-29 was normal. - She has mild leukopenia from [place].  Recommend continuing same dose of palbociclib and anastrozole. - RTC 3 months for follow-up.  CT CAP with contrast and tumor markers will be repeated.   2.  Bone metastasis: - We discussed the role of denosumab to decrease his SRE.  Because of poor dentition we did not start denosumab.   3.  Right shoulder blade pain: - She is requiring 1 tablet of Percocet in the mornings so that she can continue to be active.   4.  Low vitamin  D levels: - Vitamin D level is 26.  I have recommended starting vitamin D 1000 units daily.   5.  Nausea/vomiting: - Ibrance induced.  Continue Zofran as needed.  Breast Cancer therapy associated bone loss: I have recommended calcium, Vitamin D and weight bearing exercises.  Orders placed this encounter:  No orders of the defined types were placed in this encounter.   The patient has a good understanding of the overall plan. She agrees with it. She will call with any problems that may develop before the next visit here.  Derek Jack, MD [place] [phone]   I, Thana Ates, am acting as a scribe for Dr. Derek Jack.  I, Derek Jack MD, have reviewed the above documentation for accuracy and completeness, and I agree with the above.

## 2021-11-17 ENCOUNTER — Other Ambulatory Visit (HOSPITAL_COMMUNITY): Payer: Self-pay | Admitting: Hematology

## 2021-12-16 ENCOUNTER — Other Ambulatory Visit (HOSPITAL_COMMUNITY): Payer: Self-pay | Admitting: Hematology

## 2021-12-16 DIAGNOSIS — C7951 Secondary malignant neoplasm of bone: Secondary | ICD-10-CM

## 2021-12-16 DIAGNOSIS — G893 Neoplasm related pain (acute) (chronic): Secondary | ICD-10-CM

## 2021-12-16 MED ORDER — OXYCODONE-ACETAMINOPHEN 5-325 MG PO TABS: 1.0000 | ORAL_TABLET | Freq: Two times a day (BID) | ORAL | 0 refills | Status: DC | PRN

## 2022-01-30 ENCOUNTER — Other Ambulatory Visit: Payer: Self-pay | Admitting: *Deleted

## 2022-01-30 DIAGNOSIS — G893 Neoplasm related pain (acute) (chronic): Secondary | ICD-10-CM

## 2022-01-30 DIAGNOSIS — C7951 Secondary malignant neoplasm of bone: Secondary | ICD-10-CM

## 2022-01-30 MED ORDER — OXYCODONE-ACETAMINOPHEN 5-325 MG PO TABS: 1.0000 | ORAL_TABLET | Freq: Two times a day (BID) | ORAL | 0 refills | Status: DC | PRN

## 2022-02-04 ENCOUNTER — Ambulatory Visit (HOSPITAL_COMMUNITY)
Admission: RE | Admit: 2022-02-04 | Discharge: 2022-02-04 | Disposition: A | Discharge status: Home / Self Care | Payer: Medicare Other | Source: Ambulatory Visit | Attending: Hematology | Admitting: Hematology

## 2022-02-04 ENCOUNTER — Inpatient Hospital Stay (HOSPITAL_COMMUNITY): Payer: Medicare Other | Attending: Hematology

## 2022-02-04 DIAGNOSIS — Z170 Estrogen receptor positive status [ER+]: Secondary | ICD-10-CM | POA: Diagnosis not present

## 2022-02-04 DIAGNOSIS — E559 Vitamin D deficiency, unspecified: Secondary | ICD-10-CM

## 2022-02-04 DIAGNOSIS — R112 Nausea with vomiting, unspecified: Secondary | ICD-10-CM | POA: Insufficient documentation

## 2022-02-04 DIAGNOSIS — Z79811 Long term (current) use of aromatase inhibitors: Secondary | ICD-10-CM | POA: Diagnosis not present

## 2022-02-04 DIAGNOSIS — Z79899 Other long term (current) drug therapy: Secondary | ICD-10-CM | POA: Insufficient documentation

## 2022-02-04 DIAGNOSIS — C50912 Malignant neoplasm of unspecified site of left female breast: Secondary | ICD-10-CM | POA: Insufficient documentation

## 2022-02-04 DIAGNOSIS — C7951 Secondary malignant neoplasm of bone: Secondary | ICD-10-CM | POA: Insufficient documentation

## 2022-02-04 LAB — COMPREHENSIVE METABOLIC PANEL
ALT: 16 U/L (ref 0–44)
AST: 17 U/L (ref 15–41)
Albumin: 4.1 g/dL (ref 3.5–5.0)
Alkaline Phosphatase: 81 U/L (ref 38–126)
Anion gap: 5 (ref 5–15)
BUN: 18 mg/dL (ref 8–23)
CO2: 27 mmol/L (ref 22–32)
Calcium: 8.8 mg/dL — ABNORMAL LOW (ref 8.9–10.3)
Chloride: 106 mmol/L (ref 98–111)
Creatinine, Ser: 0.92 mg/dL (ref 0.44–1.00)
GFR, Estimated: >60 mL/min (ref >60)
Glucose, Bld: 97 mg/dL (ref 70–99)
Potassium: 3.7 mmol/L (ref 3.5–5.1)
Sodium: 138 mmol/L (ref 135–145)
Total Bilirubin: 0.2 mg/dL — ABNORMAL LOW (ref 0.3–1.2)
Total Protein: 7.1 g/dL (ref 6.5–8.1)

## 2022-02-04 LAB — CBC WITH DIFFERENTIAL/PLATELET
Abs Immature Granulocytes: 0.02 10*3/uL (ref 0.00–0.07)
Basophils Absolute: 0.1 10*3/uL (ref 0.0–0.1)
Basophils Relative: 2 %
Eosinophils Absolute: 0 10*3/uL (ref 0.0–0.5)
Eosinophils Relative: 1 %
HCT: 39.5 % (ref 36.0–46.0)
Hemoglobin: 13.1 g/dL (ref 12.0–15.0)
Immature Granulocytes: 1 %
Lymphocytes Relative: 36 %
Lymphs Abs: 1.5 10*3/uL (ref 0.7–4.0)
MCH: 33.5 pg (ref 26.0–34.0)
MCHC: 33.2 g/dL (ref 30.0–36.0)
MCV: 101 fL — ABNORMAL HIGH (ref 80.0–100.0)
Monocytes Absolute: 0.3 10*3/uL (ref 0.1–1.0)
Monocytes Relative: 6 %
Neutro Abs: 2.3 10*3/uL (ref 1.7–7.7)
Neutrophils Relative %: 54 %
Platelets: 239 10*3/uL (ref 150–400)
RBC: 3.91 MIL/uL (ref 3.87–5.11)
RDW: 13.2 % (ref 11.5–15.5)
WBC: 4.3 10*3/uL (ref 4.0–10.5)
nRBC: 0 % (ref 0.0–0.2)

## 2022-02-04 LAB — VITAMIN D 25 HYDROXY (VIT D DEFICIENCY, FRACTURES): Vit D, 25-Hydroxy: 25.24 ng/mL — ABNORMAL LOW (ref 30–100)

## 2022-02-04 MED ORDER — IOHEXOL 300 MG/ML  SOLN
100.0000 mL | Freq: Once | INTRAMUSCULAR | Status: AC | PRN
  Administered 2022-02-04: 100 mL via INTRAVENOUS

## 2022-02-05 LAB — CANCER ANTIGEN 15-3: CA 15-3: 21.4 U/mL (ref 0.0–25.0)

## 2022-02-11 ENCOUNTER — Inpatient Hospital Stay (HOSPITAL_COMMUNITY): Payer: Medicare Other | Admitting: Hematology

## 2022-02-11 VITALS — BP 141/81 | HR 72 | Temp 98.3°F | Resp 18 | Ht 63.98 in | Wt 180.3 lb

## 2022-02-11 DIAGNOSIS — C7951 Secondary malignant neoplasm of bone: Secondary | ICD-10-CM | POA: Diagnosis not present

## 2022-02-11 DIAGNOSIS — C50912 Malignant neoplasm of unspecified site of left female breast: Secondary | ICD-10-CM | POA: Diagnosis not present

## 2022-02-11 NOTE — Progress Notes (Signed)
Patient is taking Anastrozole and Ibrance as prescribed.  She has not missed any doses and reports no side effects at this time.

## 2022-02-11 NOTE — Patient Instructions (Addendum)
Kaitlin Baker at [facility] Discharge Instructions   You were seen and examined today by Dr. Delton Coombes.  He reviewed the results of your lab work which are normal. Your vitamin D remains low. Double your Vitamin D intake to 2000 units daily.   He reviewed the result of of your CT scan which shows the cancer in your body has not grown or spread any further.   Continue Ibrance and anastrozole as prescribed.   We will repeat your blood work in 3 months and see you back to review the results.    Thank you for choosing [facility] at [facility] to provide your oncology and hematology care.  To afford each patient quality time with our provider, please arrive at least 15 minutes before your scheduled appointment time.   If you have a lab appointment with the [facility] please come in thru the Main Entrance and check in at the main information desk.  You need to re-schedule your appointment should you arrive 10 or more minutes late.  We strive to give you quality time with our providers, and arriving late affects you and other patients whose appointments are after yours.  Also, if you no show three or more times for appointments you may be dismissed from the clinic at the providers discretion.     Again, thank you for choosing [facility].  Our hope is that these requests will decrease the amount of time that you wait before being seen by our physicians.       _____________________________________________________________  Should you have questions after your visit to [facility], please contact our office at [phone] and follow the prompts.  Our office hours are 8:00 a.m. and 4:30 p.m. Monday - Friday.  Please note that voicemails left after 4:00 p.m. may not be returned until the following business day.  We are closed weekends and major holidays.  You do have access to a nurse 24-7, just call the main number to the  clinic [phone] and do not press any options, hold on the line and a nurse will answer the phone.    For prescription refill requests, have your pharmacy contact our office and allow 72 hours.    Due to Covid, you will need to wear a mask upon entering the hospital. If you do not have a mask, a mask will be given to you at the Main Entrance upon arrival. For doctor visits, patients may have 1 support person age 12 or older with them. For treatment visits, patients can not have anyone with them due to social distancing guidelines and our immunocompromised population.

## 2022-02-11 NOTE — Progress Notes (Signed)
[facility] [address] [place] [address]   Patient Care Team: Default, Provider, MD as PCP - General  SUMMARY OF ONCOLOGIC HISTORY: Oncology History   No history exists.    CHIEF COMPLIANT: Follow-up for left breast cancer   INTERVAL HISTORY: Kaitlin Baker is a 66 y.o. female here today for follow up of her left breast cancer. Her last visit was on 11/11/2021.   Today she reports feeling good. She is taking Ibrance and tolerating it well. She denies nausea, vomiting, and infections. Her lower back pain is stable. She also continues to take anastrozole and reports tolerating it well. She continues to take vitamin D but admits she will occasionally forget to take it. She is not taking Zofran. Her appetite is good. She denies ankle swellings.   REVIEW OF SYSTEMS:   Review of Systems  Constitutional:  Negative for appetite change and fatigue.  Cardiovascular:  Negative for leg swelling.  Gastrointestinal:  Negative for nausea and vomiting.  Musculoskeletal:  Positive for back pain (6/10 lower R - stable).  All other systems reviewed and are negative.  I have reviewed the past medical history, past surgical history, social history and family history with the patient and they are unchanged from previous note.   ALLERGIES:   is allergic to codeine.   MEDICATIONS:  Current Outpatient Medications  Medication Sig Dispense Refill   anastrozole (ARIMIDEX) 1 MG tablet Take 1 tablet by mouth once daily 90 tablet 0   ondansetron (ZOFRAN) 8 MG tablet Take 1 tablet (8 mg total) by mouth every 8 (eight) hours as needed for nausea or vomiting. (Patient not taking: Reported on 11/11/2021) 60 tablet 2   oxyCODONE-acetaminophen (PERCOCET/ROXICET) 5-325 MG tablet Take 1 tablet by mouth every 12 (twelve) hours as needed for moderate pain. 60 tablet 0   palbociclib (IBRANCE) 100 MG tablet TAKE 1 TABLET (100 MG TOTAL) BY MOUTH DAILY. TAKE FOR 21 DAYS ON, 7 DAYS OFF, REPEAT  EVERY 28 DAYS. 42 tablet 3   No current facility-administered medications for this visit.     PHYSICAL EXAMINATION: Performance status (ECOG): 2 - Symptomatic, <50% confined to bed  There were no vitals filed for this visit. Wt Readings from Last 3 Encounters:  11/11/21 177 lb 3.2 oz (80.4 kg)  07/30/21 171 lb 9.6 oz (77.8 kg)  05/21/21 169 lb (76.7 kg)   Physical Exam Vitals reviewed.  Constitutional:      Appearance: Normal appearance.  Cardiovascular:     Rate and Rhythm: Normal rate and regular rhythm.     Pulses: Normal pulses.     Heart sounds: Normal heart sounds.  Pulmonary:     Effort: Pulmonary effort is normal.     Breath sounds: Normal breath sounds.  Neurological:     General: No focal deficit present.     Mental Status: She is alert and oriented to person, place, and time.  Psychiatric:        Mood and Affect: Mood normal.        Behavior: Behavior normal.    Breast Exam Chaperone: Thana Ates     LABORATORY DATA:  I have reviewed the data as listed    Latest Ref Rng & Units 02/04/2022    8:48 AM 11/04/2021    9:41 AM 07/24/2021    9:03 AM  CMP  Glucose 70 - 99 mg/dL 97   94   94    BUN 8 - 23 mg/dL 18  18   22    Creatinine 0.44 - 1.00 mg/dL 0.92   0.92   1.12    Sodium 135 - 145 mmol/L 138   138   137    Potassium 3.5 - 5.1 mmol/L 3.7   3.8   4.1    Chloride 98 - 111 mmol/L 106   105   100    CO2 22 - 32 mmol/L '27   26   29    ' Calcium 8.9 - 10.3 mg/dL 8.8   9.4   9.5    Total Protein 6.5 - 8.1 g/dL 7.1   7.1   7.5    Total Bilirubin 0.3 - 1.2 mg/dL 0.2   0.5   0.5    Alkaline Phos 38 - 126 U/L 81   64   72    AST 15 - 41 U/L '17   18   19    ' ALT 0 - 44 U/L '16   14   16     ' Lab Results  Component Value Date   CAN153 21.4 02/04/2022   CAN153 21.8 11/04/2021   CAN153 22.4 07/24/2021   Lab Results  Component Value Date   WBC 4.3 02/04/2022   HGB 13.1 02/04/2022   HCT 39.5 02/04/2022   MCV 101.0 (H) 02/04/2022   PLT 239 02/04/2022    NEUTROABS 2.3 02/04/2022    ASSESSMENT:  1.  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  She did not seek medical attention.  She did not have mammograms for the past 30 years. -Reported 26 pound weight loss which was intentional in the last 6 months. -Developed fracture of the proximal left humeral diaphysis when she picked up her cat. -CT humerus left without contrast on 05/23/2020 shows minimally displaced oblique fracture of the proximal left humeral diaphysis with no definitive CT evidence of underlying lesion.  Nonspecific left axillary adenopathy. -Physical exam today reveals left breast mass occupying the majority of the outer quadrant with skin thickening.  Could not palpate the axillary lymph node as she was unable to lift her arm. -PET scan on 06/24/2020 showed irregular hypermetabolic 8.1 x 4.0 left breast mass compatible with malignancy with left axillary nodal metastasis.  Extensive multifocal mixed lytic and sclerotic metastatic disease throughout the axial and proximal appendicular skeleton. -Ultrasound-guided left breast biopsy on 07/11/2020 shows invasive mammary carcinoma, grade 2.  ER/PR 90% positive.  Ki-67 60%.  HER-2 2+ by IHC.  HER-2 negative by FISH. -Anastrozole and palbociclib started on 07/19/2020. - Germline mutation testing was negative.   2.  Social/family history: -Lives at home with husband.  Never smoker.  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   PLAN:  1.  Metastatic ER positive left breast cancer to the bones: - She is taking Ibrance 100 mg 3 weeks on/1 week off. - Reviewed CT CAP (02/05/2022): Stable lytic and sclerotic bone lesions.  Asymmetric left breast tissue stable.  Left axillary 0.9 cm lymph node stable.  Other nonmalignant findings.  Unchanged T8 compression fracture. - Reviewed labs today which showed  normal LFTs.  CBC was normal.  CA 15-3 was normal at 21. - Recommend continuing Ibrance 100 mg 3 weeks on/1 week off.  Recommend continuing anastrozole 1 mg daily. - RTC 3 months for follow-up with repeat labs and tumor marker.  Plan to repeat scan in  6 months.   2.  Bone metastasis: - We discussed the role of denosumab to decrease skeletal related events. - Because of poor dentition and after assessing risks and benefits we did not start denosumab.   3.  Right shoulder blade pain: - Continue Percocet 1 tablet in the mornings which is helping her to be active during the day.   4.  Low vitamin D levels: - She is taking vitamin D 2 Gummies daily.  Vitamin D level is 25. - Recommend increase vitamin D to 4 Gummies daily.   5.  Nausea/vomiting: - Continue Zofran as needed.  She has not required it in the recent past.  Breast Cancer therapy associated bone loss: I have recommended calcium, Vitamin D and weight bearing exercises.  Orders placed this encounter:  No orders of the defined types were placed in this encounter.   The patient has a good understanding of the overall plan. She agrees with it. She will call with any problems that may develop before the next visit here.  Derek Jack, MD [place] [phone]   I, Thana Ates, am acting as a scribe for Dr. Derek Jack.  I, Derek Jack MD, have reviewed the above documentation for accuracy and completeness, and I agree with the above.

## 2022-02-18 ENCOUNTER — Other Ambulatory Visit (HOSPITAL_COMMUNITY): Payer: Self-pay | Admitting: *Deleted

## 2022-02-18 ENCOUNTER — Encounter (HOSPITAL_COMMUNITY): Payer: Self-pay

## 2022-02-18 DIAGNOSIS — C50919 Malignant neoplasm of unspecified site of unspecified female breast: Secondary | ICD-10-CM

## 2022-02-18 NOTE — Telephone Encounter (Signed)
Refill sent via fax to [facility] @ [phone] for Ibrance with 3 refills.  Per last ovn, patient is tolerating and is to continue on therapy.

## 2022-02-19 NOTE — Telephone Encounter (Signed)
May you address this

## 2022-02-20 ENCOUNTER — Other Ambulatory Visit (HOSPITAL_COMMUNITY): Payer: Self-pay

## 2022-02-20 MED ORDER — ANASTROZOLE 1 MG PO TABS: 1.0000 mg | ORAL_TABLET | Freq: Every day | ORAL | 3 refills | Status: DC

## 2022-03-30 ENCOUNTER — Encounter (HOSPITAL_COMMUNITY): Payer: Self-pay

## 2022-03-30 ENCOUNTER — Other Ambulatory Visit (HOSPITAL_COMMUNITY): Payer: Self-pay | Admitting: Hematology

## 2022-03-30 ENCOUNTER — Other Ambulatory Visit (HOSPITAL_COMMUNITY): Payer: Self-pay

## 2022-03-30 DIAGNOSIS — C7951 Secondary malignant neoplasm of bone: Secondary | ICD-10-CM

## 2022-03-30 DIAGNOSIS — G893 Neoplasm related pain (acute) (chronic): Secondary | ICD-10-CM

## 2022-03-30 MED ORDER — OXYCODONE-ACETAMINOPHEN 5-325 MG PO TABS: 1.0000 | ORAL_TABLET | Freq: Two times a day (BID) | ORAL | 0 refills | Status: DC | PRN

## 2022-05-12 ENCOUNTER — Inpatient Hospital Stay: Payer: Medicare Other

## 2022-05-14 ENCOUNTER — Other Ambulatory Visit (HOSPITAL_COMMUNITY): Payer: Self-pay

## 2022-05-19 ENCOUNTER — Ambulatory Visit: Payer: Medicare Other | Admitting: Hematology

## 2022-05-20 ENCOUNTER — Other Ambulatory Visit: Payer: Self-pay | Admitting: *Deleted

## 2022-05-20 DIAGNOSIS — G893 Neoplasm related pain (acute) (chronic): Secondary | ICD-10-CM

## 2022-05-20 DIAGNOSIS — C7951 Secondary malignant neoplasm of bone: Secondary | ICD-10-CM

## 2022-05-20 MED ORDER — OXYCODONE-ACETAMINOPHEN 5-325 MG PO TABS: 1.0000 | ORAL_TABLET | Freq: Two times a day (BID) | ORAL | 0 refills | Status: DC | PRN

## 2022-05-29 IMAGING — CT CT CHEST-ABD-PELV W/ CM
2 of 5 series · 13 of 36 positions shown, 15 images · IV contrast (Omnipaque or Isovue)
Comparison: PET-CT June 24, 2020

CLINICAL DATA: Left breast cancer assess treatment response.

EXAM:
CT CHEST, ABDOMEN, AND PELVIS WITH CONTRAST
TECHNIQUE: Multidetector CT imaging of the chest, abdomen and pelvis was
performed following the standard protocol during bolus
administration of intravenous contrast.
CONTRAST:  100mL OMNIPAQUE IOHEXOL 300 MG/ML  SOLN

[Series 2: cap with · axial · 0.90mm/px · z∈[+929,+1424]mm · 10 of 123 slices shown, 12 images]
[im 12/123  mediastinal]
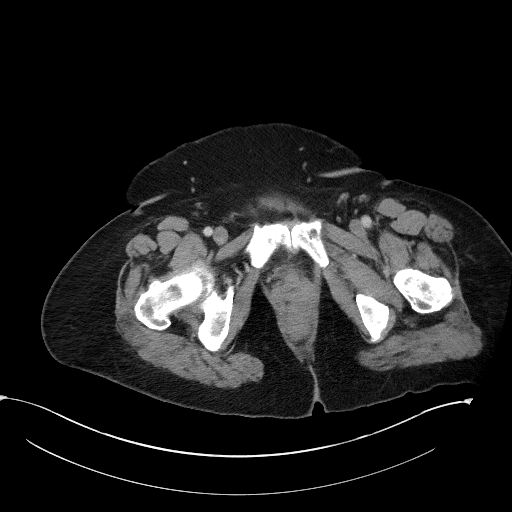
[im 12/123  bone]
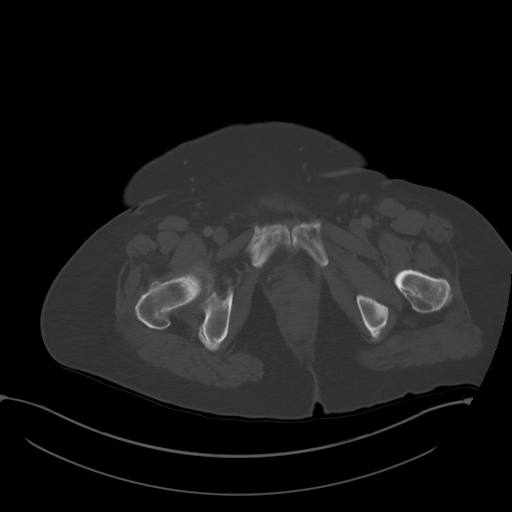
[im 23/123  mediastinal]
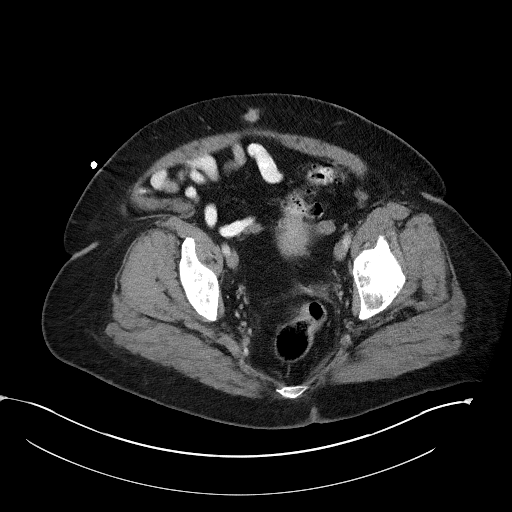
[im 34/123  mediastinal]
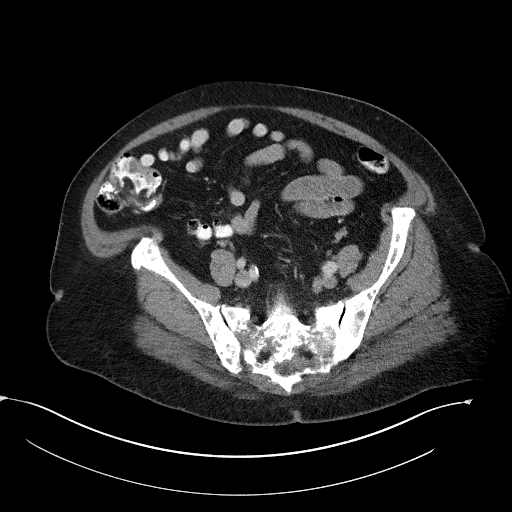
[im 45/123  mediastinal]
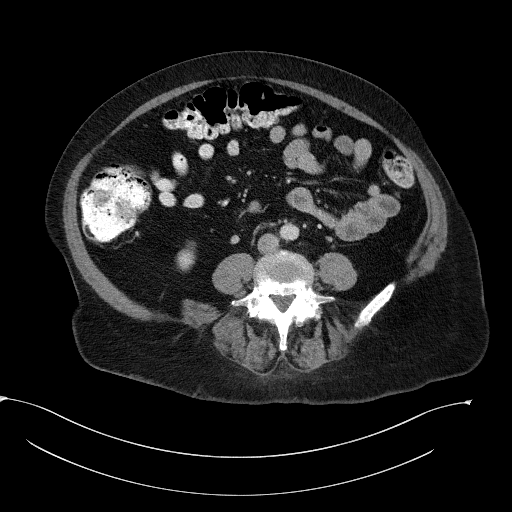
[im 56/123  mediastinal]
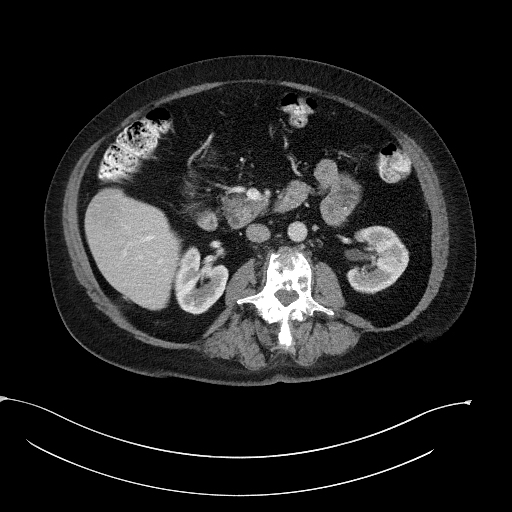
[im 67/123  mediastinal]
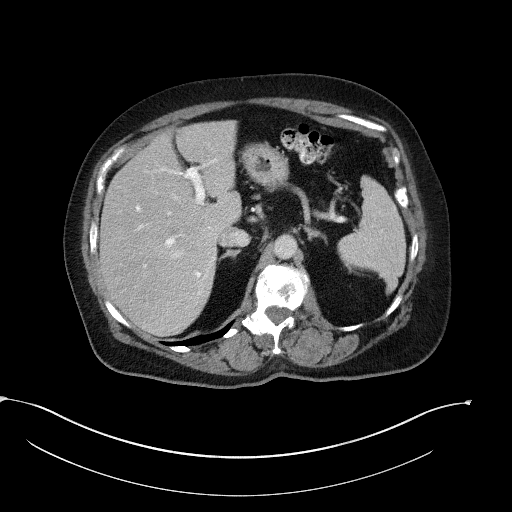
[im 78/123  mediastinal]
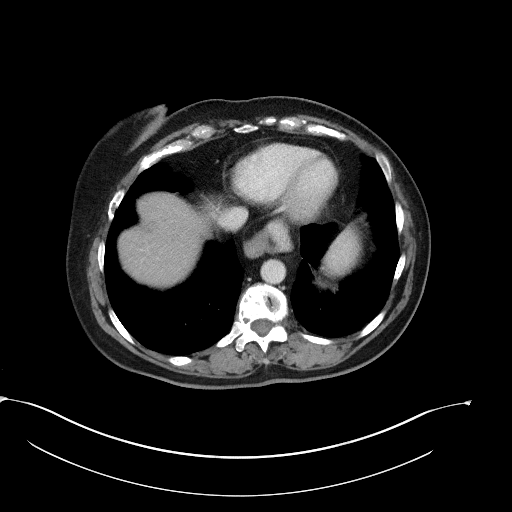
[im 89/123  mediastinal]
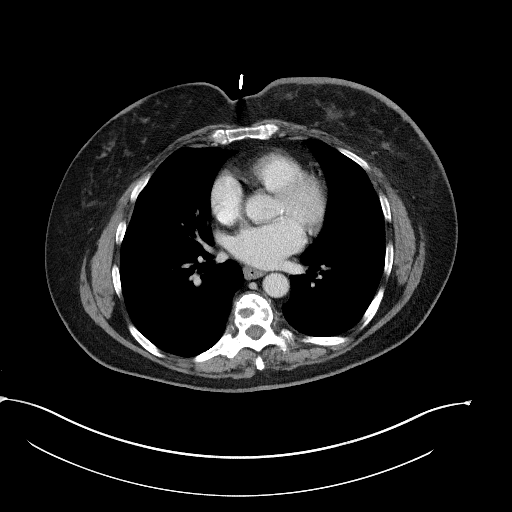
[im 100/123  mediastinal]
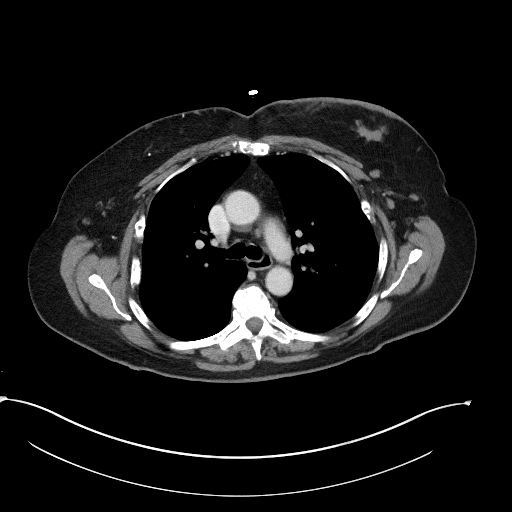
[im 100/123  bone]
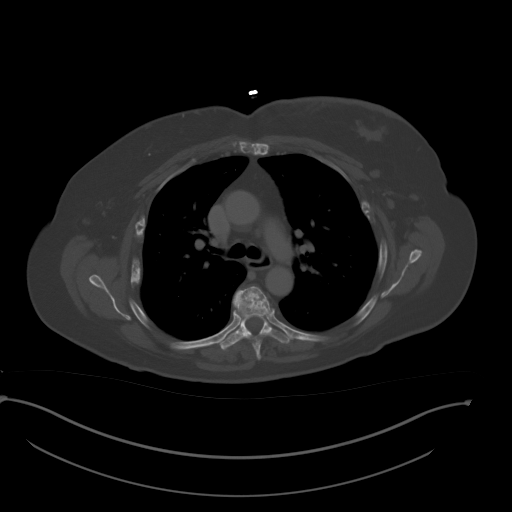
[im 111/123  mediastinal]
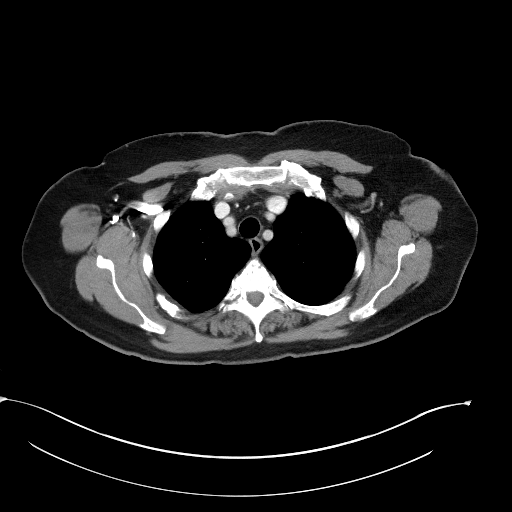

[Series 4: coronals · coronal · 0.81mm/px · 3 of 176 slices shown]
[im 36/176  mediastinal]
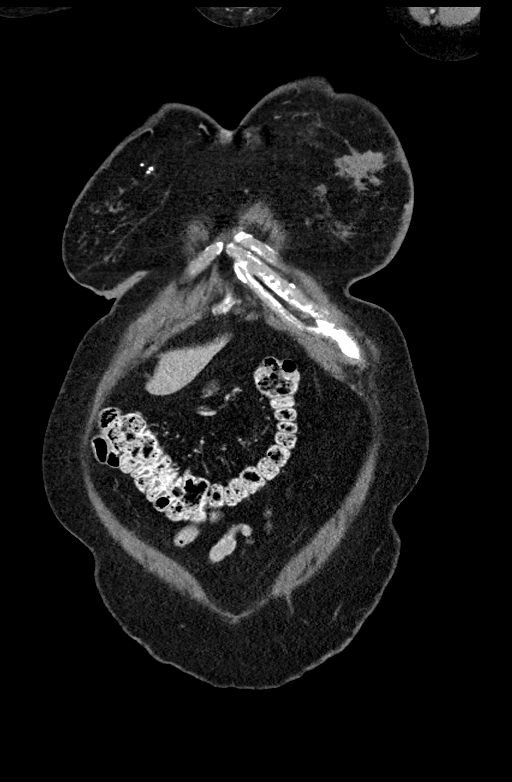
[im 71/176  mediastinal]
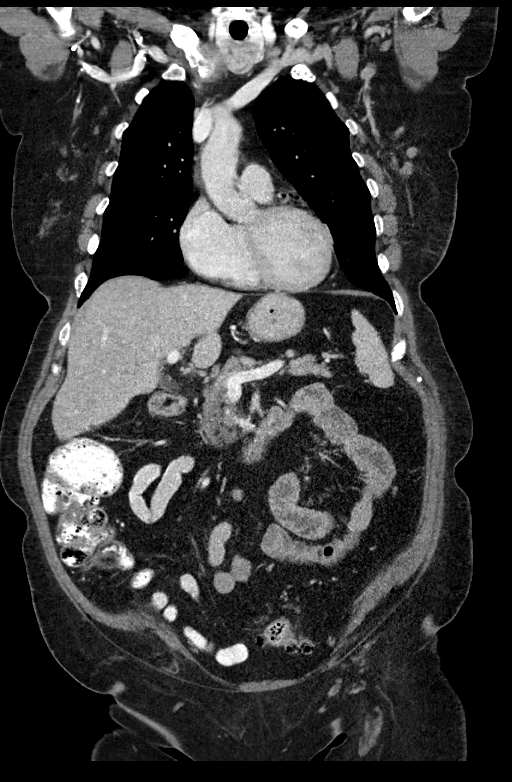
[im 106/176  mediastinal]
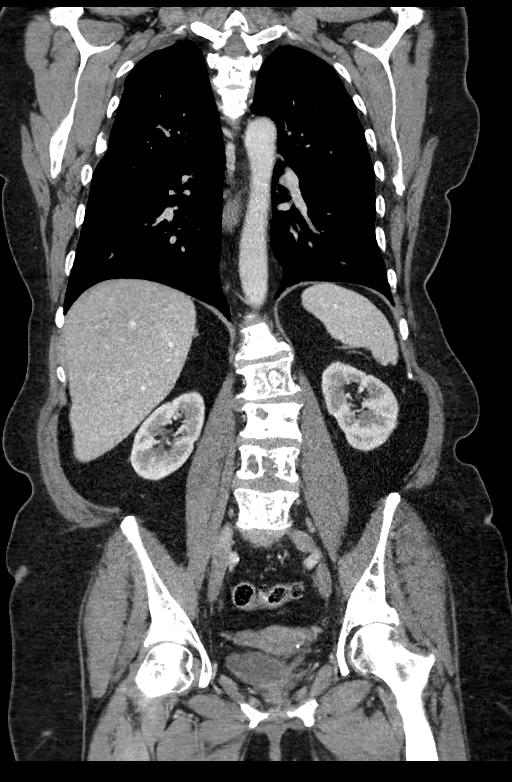

[13 of 36 positions shown; findings below may reference images not displayed]

FINDINGS: CT CHEST FINDINGS

Cardiovascular: Thoracic aortic atherosclerosis. No thoracic aortic
aneurysm. No central pulmonary embolus. Normal size heart. No
significant pericardial effusion/thickening.

Mediastinum/Nodes: Decreased size of the left axillary lymph nodes
for instance the previously indexed in the left lower axilla now
measures 11 mm on image [DATE] previously 16 mm. No pathologically
enlarged right axillary, mediastinal or hilar lymph nodes.

Similar appearance of the low-density lesion in the thyroid isthmus
which measures 2.1 x 1.9 x 2.5 cm on image [DATE] and 108/5.

Lungs/Pleura: Left lower lobe atelectasis for scarring. No
suspicious pulmonary nodules or masses. No focal consolidation. No
pleural effusion. No pneumothorax.

Musculoskeletal: Slightly decreased size of the irregular left
breast mass with similar cutaneous skin thickening which now
measures 7 x 2.6 cm on image [DATE] previously 8.1 x 4.0 cm.

Partially visualized anterior cervical fusion hardware. Please see
below regarding the extensive osseous metastatic disease.

CT ABDOMEN PELVIS FINDINGS

Hepatobiliary: No suspicious hepatic lesions. Gallbladder surgically
absent. No biliary ductal dilation.

Pancreas: Unremarkable. No pancreatic ductal dilatation or
surrounding inflammatory changes.

Spleen: Normal in size without focal abnormality.

Adrenals/Urinary Tract: Adrenal glands are unremarkable. Kidneys are
normal, without renal calculi, focal lesion, or hydronephrosis.
Bladder is unremarkable degree of distension.

Stomach/Bowel: Small hiatal hernia otherwise the stomach is within
normal limits. Appendix appears normal. Sigmoid colonic
diverticulosis without findings of acute diverticulitis. No evidence
of bowel wall thickening, distention, or inflammatory changes.

Vascular/Lymphatic: No significant vascular findings are present. No
enlarged abdominal or pelvic lymph nodes.

Reproductive: Uterus and bilateral adnexa are unremarkable.

Other: No abdominopelvic ascites.

Musculoskeletal: No significant change in the extensive multifocal
lytic and blastic metastatic lesions throughout the axial and
proximal appendicular skeleton involving the thoracolumbar spine,
bilateral ribs, sternum, sacrum, iliac crests, proximal femora and
proximal humeri. Continued interval healing of the pathologic
fracture in the left proximal humerus.
IMPRESSION: 1. Slightly decreased size of the irregular left breast mass and
decreased size of the left axillary lymph nodes, suggestive of
treatment response.
2. No significant change in the extensive multifocal lytic and
blastic metastatic lesions throughout the axial and proximal
appendicular skeleton, with continued interval healing of the
proximal left humeral pathologic fracture.
3. Similar appearance of the low-density lesion in the thyroid
isthmus which measures 2.1 x 1.9 x 2.5 cm, further evaluation with
dedicated thyroid ultrasound is recommended. (ref: [HOSPITAL].
[DATE]): 143-50).
4. Aortic atherosclerosis.

Aortic Atherosclerosis ([id number]).

## 2022-06-03 ENCOUNTER — Inpatient Hospital Stay: Payer: Medicare Other | Attending: Hematology

## 2022-06-03 DIAGNOSIS — Z79811 Long term (current) use of aromatase inhibitors: Secondary | ICD-10-CM | POA: Insufficient documentation

## 2022-06-03 DIAGNOSIS — Z170 Estrogen receptor positive status [ER+]: Secondary | ICD-10-CM | POA: Insufficient documentation

## 2022-06-03 DIAGNOSIS — C50912 Malignant neoplasm of unspecified site of left female breast: Secondary | ICD-10-CM | POA: Insufficient documentation

## 2022-06-03 DIAGNOSIS — Z79899 Other long term (current) drug therapy: Secondary | ICD-10-CM | POA: Insufficient documentation

## 2022-06-03 DIAGNOSIS — R112 Nausea with vomiting, unspecified: Secondary | ICD-10-CM | POA: Insufficient documentation

## 2022-06-03 DIAGNOSIS — C50919 Malignant neoplasm of unspecified site of unspecified female breast: Secondary | ICD-10-CM

## 2022-06-03 DIAGNOSIS — R52 Pain, unspecified: Secondary | ICD-10-CM | POA: Insufficient documentation

## 2022-06-03 DIAGNOSIS — C7951 Secondary malignant neoplasm of bone: Secondary | ICD-10-CM | POA: Diagnosis not present

## 2022-06-03 LAB — COMPREHENSIVE METABOLIC PANEL
ALT: 15 U/L (ref 0–44)
AST: 16 U/L (ref 15–41)
Albumin: 4.1 g/dL (ref 3.5–5.0)
Alkaline Phosphatase: 68 U/L (ref 38–126)
Anion gap: 8 (ref 5–15)
BUN: 22 mg/dL (ref 8–23)
CO2: 26 mmol/L (ref 22–32)
Calcium: 9 mg/dL (ref 8.9–10.3)
Chloride: 106 mmol/L (ref 98–111)
Creatinine, Ser: 1.04 mg/dL — ABNORMAL HIGH (ref 0.44–1.00)
GFR, Estimated: 59 mL/min — ABNORMAL LOW (ref >60)
Glucose, Bld: 95 mg/dL (ref 70–99)
Potassium: 3.7 mmol/L (ref 3.5–5.1)
Sodium: 140 mmol/L (ref 135–145)
Total Bilirubin: 0.6 mg/dL (ref 0.3–1.2)
Total Protein: 7.4 g/dL (ref 6.5–8.1)

## 2022-06-03 LAB — CBC WITH DIFFERENTIAL/PLATELET
Abs Immature Granulocytes: 0.01 10*3/uL (ref 0.00–0.07)
Basophils Absolute: 0.1 10*3/uL (ref 0.0–0.1)
Basophils Relative: 2 %
Eosinophils Absolute: 0.1 10*3/uL (ref 0.0–0.5)
Eosinophils Relative: 2 %
HCT: 38.6 % (ref 36.0–46.0)
Hemoglobin: 13.3 g/dL (ref 12.0–15.0)
Immature Granulocytes: 0 %
Lymphocytes Relative: 44 %
Lymphs Abs: 1.5 10*3/uL (ref 0.7–4.0)
MCH: 34.5 pg — ABNORMAL HIGH (ref 26.0–34.0)
MCHC: 34.5 g/dL (ref 30.0–36.0)
MCV: 100 fL (ref 80.0–100.0)
Monocytes Absolute: 0.2 10*3/uL (ref 0.1–1.0)
Monocytes Relative: 5 %
Neutro Abs: 1.6 10*3/uL — ABNORMAL LOW (ref 1.7–7.7)
Neutrophils Relative %: 47 %
Platelets: 290 10*3/uL (ref 150–400)
RBC: 3.86 MIL/uL — ABNORMAL LOW (ref 3.87–5.11)
RDW: 13.2 % (ref 11.5–15.5)
WBC: 3.4 10*3/uL — ABNORMAL LOW (ref 4.0–10.5)
nRBC: 0 % (ref 0.0–0.2)

## 2022-06-03 LAB — VITAMIN D 25 HYDROXY (VIT D DEFICIENCY, FRACTURES): Vit D, 25-Hydroxy: 21.56 ng/mL — ABNORMAL LOW (ref 30–100)

## 2022-06-05 LAB — CANCER ANTIGEN 15-3: CA 15-3: 20 U/mL (ref 0.0–25.0)

## 2022-06-08 ENCOUNTER — Other Ambulatory Visit: Payer: Self-pay

## 2022-06-08 ENCOUNTER — Inpatient Hospital Stay: Payer: Medicare Other | Admitting: Hematology

## 2022-06-08 ENCOUNTER — Encounter: Payer: Self-pay | Admitting: Hematology

## 2022-06-08 VITALS — BP 154/92 | HR 67 | Temp 98.0°F | Resp 18 | Ht 64.0 in | Wt 185.5 lb

## 2022-06-08 DIAGNOSIS — C7951 Secondary malignant neoplasm of bone: Secondary | ICD-10-CM | POA: Diagnosis not present

## 2022-06-08 DIAGNOSIS — C50919 Malignant neoplasm of unspecified site of unspecified female breast: Secondary | ICD-10-CM

## 2022-06-08 DIAGNOSIS — C50912 Malignant neoplasm of unspecified site of left female breast: Secondary | ICD-10-CM | POA: Diagnosis not present

## 2022-06-08 MED ORDER — PALBOCICLIB 100 MG PO TABS: ORAL_TABLET | ORAL | 3 refills | Status: DC

## 2022-06-08 NOTE — Telephone Encounter (Signed)
Ibrance refilled per verbal order from Dr. Delton Coombes

## 2022-06-08 NOTE — Progress Notes (Signed)
[facility] [address] [place] [address]   Patient Care Team: Default, Provider, MD as PCP - General Kaitlin Jack, MD as Medical Oncologist (Medical Oncology)  SUMMARY OF ONCOLOGIC HISTORY: Oncology History   No history exists.    CHIEF COMPLIANT: Follow-up for left breast cancer   INTERVAL HISTORY: [name] is a 66 y.o. female seen for follow-up of left breast cancer.  She felt bad in the summer secondary to stress.  She also had a blunt injury on the left arm on 04/26/2022 and this pain has been improving in the last 3 weeks.  No new onset pains.  She is taking Percocet 1 tablet in the morning for generalized body aches and rarely requires second dose.  REVIEW OF SYSTEMS:   Review of Systems  Constitutional:  Negative for appetite change and fatigue.  Cardiovascular:  Negative for leg swelling.  Gastrointestinal:  Positive for nausea. Negative for vomiting.  Musculoskeletal:  Positive for back pain (6/10 lower R - stable).  All other systems reviewed and are negative.   I have reviewed the past medical history, past surgical history, social history and family history with the patient and they are unchanged from previous note.   ALLERGIES:   is allergic to codeine.   MEDICATIONS:  Current Outpatient Medications  Medication Sig Dispense Refill   anastrozole (ARIMIDEX) 1 MG tablet Take 1 tablet (1 mg total) by mouth daily. 90 tablet 3   oxyCODONE-acetaminophen (PERCOCET/ROXICET) 5-325 MG tablet Take 1 tablet by mouth every 12 (twelve) hours as needed for moderate pain. 60 tablet 0   ondansetron (ZOFRAN) 8 MG tablet Take 1 tablet (8 mg total) by mouth every 8 (eight) hours as needed for nausea or vomiting. (Patient not taking: Reported on 02/11/2022) 60 tablet 2   palbociclib (IBRANCE) 100 MG tablet TAKE 1 TABLET (100 MG TOTAL) BY MOUTH DAILY. TAKE FOR 21 DAYS ON, 7 DAYS OFF, REPEAT EVERY 28 DAYS. 42 tablet 3   No current  facility-administered medications for this visit.     PHYSICAL EXAMINATION: Performance status (ECOG): 2 - Symptomatic, <50% confined to bed  Vitals:   06/08/22 0930  BP: (!) 154/92  Pulse: 67  Resp: 18  Temp: 98 F (36.7 C)  SpO2: 94%   Wt Readings from Last 3 Encounters:  06/08/22 185 lb 8 oz (84.1 kg)  02/11/22 180 lb 5.4 oz (81.8 kg)  11/11/21 177 lb 3.2 oz (80.4 kg)   Physical Exam Vitals reviewed.  Constitutional:      Appearance: Normal appearance.  Cardiovascular:     Rate and Rhythm: Normal rate and regular rhythm.     Pulses: Normal pulses.     Heart sounds: Normal heart sounds.  Pulmonary:     Effort: Pulmonary effort is normal.     Breath sounds: Normal breath sounds.  Neurological:     General: No focal deficit present.     Mental Status: She is alert and oriented to person, place, and time.  Psychiatric:        Mood and Affect: Mood normal.        Behavior: Behavior normal.       LABORATORY DATA:  I have reviewed the data as listed    Latest Ref Rng & Units 06/03/2022    9:34 AM 02/04/2022    8:48 AM 11/04/2021    9:41 AM  CMP  Glucose 70 - 99 mg/dL 95  97  94   BUN  8 - 23 mg/dL _0 Creatinine 0.44 - 1.00 mg/dL 1.04  0.92  0.92   Sodium 135 - 145 mmol/L 140  138  138   Potassium 3.5 - 5.1 mmol/L 3.7  3.7  3.8   Chloride 98 - 111 mmol/L 106  106  105   CO2 22 - 32 mmol/L _1 Calcium 8.9 - 10.3 mg/dL 9.0  8.8  9.4   Total Protein 6.5 - 8.1 g/dL 7.4  7.1  7.1   Total Bilirubin 0.3 - 1.2 mg/dL 0.6  0.2  0.5   Alkaline Phos 38 - 126 U/L 68  81  64   AST 15 - 41 U/L _2 ALT 0 - 44 U/L _3 Lab Results  Component Value Date   CAN153 20.0 06/03/2022   CAN153 21.4 02/04/2022   CAN153 21.8 11/04/2021   Lab Results  Component Value Date   WBC 3.4 (L) 06/03/2022   HGB 13.3 06/03/2022   HCT 38.6 06/03/2022   MCV 100.0 06/03/2022   PLT 290 06/03/2022   NEUTROABS 1.6 (L) 06/03/2022    ASSESSMENT:  1.   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  She did not seek medical attention.  She did not have mammograms for the past 30 years. -Reported 26 pound weight loss which was intentional in the last 6 months. -Developed fracture of the proximal left humeral diaphysis when she picked up her cat. -CT humerus left without contrast on 05/23/2020 shows minimally displaced oblique fracture of the proximal left humeral diaphysis with no definitive CT evidence of underlying lesion.  Nonspecific left axillary adenopathy. -Physical exam today reveals left breast mass occupying the majority of the outer quadrant with skin thickening.  Could not palpate the axillary lymph node as she was unable to lift her arm. -PET scan on 06/24/2020 showed irregular hypermetabolic 8.1 x 4.0 left breast mass compatible with malignancy with left axillary nodal metastasis.  Extensive multifocal mixed lytic and sclerotic metastatic disease throughout the axial and proximal appendicular skeleton. -Ultrasound-guided left breast biopsy on 07/11/2020 shows invasive mammary carcinoma, grade 2.  ER/PR 90% positive.  Ki-67 60%.  HER-2 2+ by IHC.  HER-2 negative by FISH. -Anastrozole and palbociclib started on 07/19/2020. - Germline mutation testing was negative.   2.  Social/family history: -Lives at home with husband.  Never smoker.  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   PLAN:  1.  Metastatic ER positive left breast cancer to the bones: - She is taking Ibrance 100 mg 3 weeks on/1 week off with anastrozole. - CT CAP (02/05/2022): Stable lytic and sclerotic bone lesions.  Asymmetric left breast tissue.  Left axillary lymph node stable.  Unchanged T8 compression fracture. - Reviewed labs today which showed normal LFTs and calcium.  Creatinine is 1.04 and mildly elevated.  Mild leukopenia from  Ibrance is stable. - CA 15-3 was 20.0. - Recommend continuing Ibrance and anastrozole.  RTC 3 months for follow-up.  We will plan to repeat CT CAP and bone scan along with tumor markers prior to next visit.   2.  Bone metastasis: - We discussed role of denosumab to decrease SRE.  Because of poor dentition, we have not started it.   3.  Generalized body pains: - Continue Percocet 1 tablet in the morning.  She rarely requires as a tablet in the evening.   4.  Low vitamin D levels: - Vitamin D level is 21.  Recommend starting vitamin D 2000 units daily.   5.  Nausea/vomiting: - Continue Zofran as needed.  She did not require it in the recent few weeks.  Breast Cancer therapy associated bone loss: I have recommended calcium, Vitamin D and weight bearing exercises.  Orders placed this encounter:  Orders Placed This Encounter  Procedures   CT CHEST ABDOMEN PELVIS W CONTRAST   NM Bone Scan Whole Body   CBC with Differential   Comprehensive metabolic panel   Magnesium   Cancer antigen 15-3   Vitamin D 25 hydroxy     The patient has a good understanding of the overall plan. She agrees with it. She will call with any problems that may develop before the next visit here.  Kaitlin Jack, MD [facility] [phone]

## 2022-06-08 NOTE — Progress Notes (Signed)
Patient is taking Ibrance and Anastrozole as prescribed.  She has not missed any doses and reports no side effects at this time.

## 2022-06-08 NOTE — Patient Instructions (Addendum)
[facility]  Discharge Instructions  You were seen and examined today by Dr. Delton Coombes.  Dr. Delton Coombes discussed your most recent lab work which is stable. Please take 2000 units of Vitamin D daily.  Continue Ibrance and Anastrozole as prescribed. These drugs are known to work for 2 to 3 years. Should it stop working, there are additional treatment options.  Continue Percocet as needed.  Follow-up as scheduled.  Thank you for choosing [facility] to provide your oncology and hematology care.   To afford each patient quality time with our provider, please arrive at least 15 minutes before your scheduled appointment time. You may need to reschedule your appointment if you arrive late (10 or more minutes). Arriving late affects you and other patients whose appointments are after yours.  Also, if you miss three or more appointments without notifying the office, you may be dismissed from the clinic at the provider's discretion.    Again, thank you for choosing [facility].  Our hope is that these requests will decrease the amount of time that you wait before being seen by our physicians.   If you have a lab appointment with the [facility] please come in thru the Main Entrance and check in at the main information desk.           _____________________________________________________________  Should you have questions after your visit to [facility], please contact our office at [phone] and follow the prompts.  Our office hours are 8:00 a.m. to 4:30 p.m. Monday - Thursday and 8:00 a.m. to 2:30 p.m. Friday.  Please note that voicemails left after 4:00 p.m. may not be returned until the following business day.  We are closed weekends and all major holidays.  You do have access to a nurse 24-7, just call the main number to the clinic [phone] and do not press any options, hold on the line and a nurse will  answer the phone.    For prescription refill requests, have your pharmacy contact our office and allow 72 hours.    Masks are optional in the cancer centers. If you would like for your care team to wear a mask while they are taking care of you, please let them know. You may have one support person who is at least 68 years old accompany you for your appointments.

## 2022-07-07 ENCOUNTER — Other Ambulatory Visit: Payer: Self-pay | Admitting: *Deleted

## 2022-07-07 DIAGNOSIS — C7951 Secondary malignant neoplasm of bone: Secondary | ICD-10-CM

## 2022-07-07 DIAGNOSIS — G893 Neoplasm related pain (acute) (chronic): Secondary | ICD-10-CM

## 2022-07-07 MED ORDER — OXYCODONE-ACETAMINOPHEN 5-325 MG PO TABS: 1.0000 | ORAL_TABLET | Freq: Two times a day (BID) | ORAL | 0 refills | Status: DC | PRN

## 2022-07-10 ENCOUNTER — Telehealth: Payer: Self-pay

## 2022-07-10 ENCOUNTER — Other Ambulatory Visit (HOSPITAL_COMMUNITY): Payer: Self-pay

## 2022-07-10 NOTE — Telephone Encounter (Signed)
[name] Encounter   Received notification that patient is due for re-enrollment for assistance for Ibrance through [employer].   Re-enrollment process has been initiated and will be submitted upon completion of necessary documents.  [employer]'s phone number [phone].   I will continue to follow until final determination.  Berdine Addison, [facility] Patient [place]  [phone] (phone) [phone] (fax) 07/10/2022 9:53 AM

## 2022-07-10 NOTE — Telephone Encounter (Signed)
[name] Encounter  Reached out and spoke with patient regarding PAP paperwork, explained that I would send it to their preferred email via [employer].   Confirmed email address: [email or web address].    Patient expressed understanding and consent.  Will follow up once paperwork has been signed and returned.   Berdine Addison, [facility] Patient [place]  [phone] (phone) [phone] (fax) 07/10/2022 1:01 PM

## 2022-07-14 NOTE — Telephone Encounter (Signed)
[name] Encounter   Submitted application for assistance for Ibrance to [employer].   Application submitted via e-fax to [phone]   [employer]'s phone number [phone].   I will continue to check the status until final determination.   Berdine Addison, [facility] Patient [place]  [phone] (phone) [phone] (fax) 07/14/2022 9:44 AM

## 2022-08-14 ENCOUNTER — Other Ambulatory Visit: Payer: Self-pay

## 2022-08-14 DIAGNOSIS — C7951 Secondary malignant neoplasm of bone: Secondary | ICD-10-CM

## 2022-08-14 DIAGNOSIS — G893 Neoplasm related pain (acute) (chronic): Secondary | ICD-10-CM

## 2022-08-14 MED ORDER — OXYCODONE-ACETAMINOPHEN 5-325 MG PO TABS: 1.0000 | ORAL_TABLET | Freq: Two times a day (BID) | ORAL | 0 refills | Status: DC | PRN

## 2022-08-14 NOTE — Telephone Encounter (Signed)
From: Roxanna Mew To: Office of Derek Jack, MD Sent: 08/14/2022 5:50 AM EST Subject: Medication Renewal Request  Refills have been requested for the following medications:   oxyCODONE-acetaminophen (PERCOCET/ROXICET) 5-325 MG tablet [Sreedhar Katragadda]  Preferred pharmacy: [facility], [place] [address] [address] Delivery method: [employer]

## 2022-09-18 ENCOUNTER — Inpatient Hospital Stay: Payer: Medicare Other | Attending: Hematology

## 2022-09-18 ENCOUNTER — Ambulatory Visit (HOSPITAL_COMMUNITY)
Admission: RE | Admit: 2022-09-18 | Discharge: 2022-09-18 | Disposition: A | Discharge status: Home / Self Care | Payer: Medicare Other | Source: Ambulatory Visit | Attending: Hematology | Admitting: Hematology

## 2022-09-18 ENCOUNTER — Encounter (HOSPITAL_COMMUNITY): Payer: Self-pay

## 2022-09-18 DIAGNOSIS — Z170 Estrogen receptor positive status [ER+]: Secondary | ICD-10-CM | POA: Diagnosis not present

## 2022-09-18 DIAGNOSIS — C7951 Secondary malignant neoplasm of bone: Secondary | ICD-10-CM

## 2022-09-18 DIAGNOSIS — C50912 Malignant neoplasm of unspecified site of left female breast: Secondary | ICD-10-CM | POA: Insufficient documentation

## 2022-09-18 DIAGNOSIS — Z79811 Long term (current) use of aromatase inhibitors: Secondary | ICD-10-CM | POA: Insufficient documentation

## 2022-09-18 LAB — VITAMIN D 25 HYDROXY (VIT D DEFICIENCY, FRACTURES): Vit D, 25-Hydroxy: 17.55 ng/mL — ABNORMAL LOW (ref 30–100)

## 2022-09-18 LAB — CBC WITH DIFFERENTIAL/PLATELET
Abs Immature Granulocytes: 0.01 10*3/uL (ref 0.00–0.07)
Basophils Absolute: 0.1 10*3/uL (ref 0.0–0.1)
Basophils Relative: 2 %
Eosinophils Absolute: 0 10*3/uL (ref 0.0–0.5)
Eosinophils Relative: 1 %
HCT: 38.9 % (ref 36.0–46.0)
Hemoglobin: 13.2 g/dL (ref 12.0–15.0)
Immature Granulocytes: 0 %
Lymphocytes Relative: 37 %
Lymphs Abs: 1.4 10*3/uL (ref 0.7–4.0)
MCH: 34.6 pg — ABNORMAL HIGH (ref 26.0–34.0)
MCHC: 33.9 g/dL (ref 30.0–36.0)
MCV: 102.1 fL — ABNORMAL HIGH (ref 80.0–100.0)
Monocytes Absolute: 0.2 10*3/uL (ref 0.1–1.0)
Monocytes Relative: 5 %
Neutro Abs: 2 10*3/uL (ref 1.7–7.7)
Neutrophils Relative %: 55 %
Platelets: 307 10*3/uL (ref 150–400)
RBC: 3.81 MIL/uL — ABNORMAL LOW (ref 3.87–5.11)
RDW: 13.3 % (ref 11.5–15.5)
WBC: 3.8 10*3/uL — ABNORMAL LOW (ref 4.0–10.5)
nRBC: 0 % (ref 0.0–0.2)

## 2022-09-18 LAB — COMPREHENSIVE METABOLIC PANEL
ALT: 15 U/L (ref 0–44)
AST: 20 U/L (ref 15–41)
Albumin: 4 g/dL (ref 3.5–5.0)
Alkaline Phosphatase: 80 U/L (ref 38–126)
Anion gap: 9 (ref 5–15)
BUN: 16 mg/dL (ref 8–23)
CO2: 28 mmol/L (ref 22–32)
Calcium: 9 mg/dL (ref 8.9–10.3)
Chloride: 102 mmol/L (ref 98–111)
Creatinine, Ser: 1.12 mg/dL — ABNORMAL HIGH (ref 0.44–1.00)
GFR, Estimated: 54 mL/min — ABNORMAL LOW (ref >60)
Glucose, Bld: 94 mg/dL (ref 70–99)
Potassium: 4.1 mmol/L (ref 3.5–5.1)
Sodium: 139 mmol/L (ref 135–145)
Total Bilirubin: 0.7 mg/dL (ref 0.3–1.2)
Total Protein: 7.4 g/dL (ref 6.5–8.1)

## 2022-09-18 LAB — MAGNESIUM: Magnesium: 1.9 mg/dL (ref 1.7–2.4)

## 2022-09-18 MED ORDER — IOHEXOL 300 MG/ML  SOLN
100.0000 mL | Freq: Once | INTRAMUSCULAR | Status: AC | PRN
  Administered 2022-09-18: 100 mL via INTRAVENOUS

## 2022-09-18 MED ORDER — TECHNETIUM TC 99M MEDRONATE IV KIT
20.0000 | PACK | Freq: Once | INTRAVENOUS | Status: AC | PRN
  Administered 2022-09-18: 21 via INTRAVENOUS

## 2022-09-21 ENCOUNTER — Other Ambulatory Visit: Payer: Self-pay

## 2022-09-21 DIAGNOSIS — C7951 Secondary malignant neoplasm of bone: Secondary | ICD-10-CM

## 2022-09-21 DIAGNOSIS — G893 Neoplasm related pain (acute) (chronic): Secondary | ICD-10-CM

## 2022-09-21 MED ORDER — OXYCODONE-ACETAMINOPHEN 5-325 MG PO TABS: 1.0000 | ORAL_TABLET | Freq: Two times a day (BID) | ORAL | 0 refills | Status: DC | PRN

## 2022-09-22 NOTE — Telephone Encounter (Signed)
[name] Encounter   Received notification re-enrollment for assistance for Ibrance through [employer] has been approved. Patient may continue to receive their medication at $0 from this program.    [employer]'s phone number [phone].   Effective dates: 09/14/22 through 09/14/23  I have spoken to the patient.  Berdine Addison, [facility] Patient [place]  [phone] (phone) [phone] (fax) 09/22/2022 12:59 PM

## 2022-09-23 LAB — CANCER ANTIGEN 15-3: CA 15-3: 21.1 U/mL (ref 0.0–25.0)

## 2022-09-28 ENCOUNTER — Ambulatory Visit: Payer: Medicare Other | Admitting: Hematology

## 2022-10-07 ENCOUNTER — Inpatient Hospital Stay: Payer: Medicare Other | Admitting: Hematology

## 2022-10-07 VITALS — BP 128/84 | HR 73 | Temp 98.4°F | Resp 16 | Ht 64.0 in | Wt 192.6 lb

## 2022-10-07 DIAGNOSIS — C50912 Malignant neoplasm of unspecified site of left female breast: Secondary | ICD-10-CM | POA: Diagnosis not present

## 2022-10-07 DIAGNOSIS — C7951 Secondary malignant neoplasm of bone: Secondary | ICD-10-CM | POA: Diagnosis not present

## 2022-10-07 MED ORDER — ERGOCALCIFEROL 1.25 MG (50000 UT) PO CAPS: 50000.0000 [IU] | ORAL_CAPSULE | ORAL | 1 refills | Status: DC

## 2022-10-07 NOTE — Progress Notes (Signed)
[facility] [address] [place] [address]   Patient Care Team: Default, Provider, MD as PCP - General Derek Jack, MD as Medical Oncologist (Medical Oncology)  SUMMARY OF ONCOLOGIC HISTORY: Oncology History   No history exists.    CHIEF COMPLIANT: Follow-up for left breast cancer   INTERVAL HISTORY: [name] is a 67 y.o. female is seen for metastatic breast cancer on treatments.  She had CT scan and bone scan done.  She is tolerating anastrozole and Ibrance reasonably well.  She reports that she missed doses of vitamin D but has been taking it consistently since 09/18/2022.  REVIEW OF SYSTEMS:   Review of Systems  Constitutional:  Negative for appetite change and fatigue.  Cardiovascular:  Negative for leg swelling.  Gastrointestinal:  Negative for vomiting.  Musculoskeletal:  Positive for back pain (6/10 lower R - stable).  All other systems reviewed and are negative.   I have reviewed the past medical history, past surgical history, social history and family history with the patient and they are unchanged from previous note.   ALLERGIES:   is allergic to codeine.   MEDICATIONS:  Current Outpatient Medications  Medication Sig Dispense Refill   anastrozole (ARIMIDEX) 1 MG tablet Take 1 tablet (1 mg total) by mouth daily. 90 tablet 3   ondansetron (ZOFRAN) 8 MG tablet Take 1 tablet (8 mg total) by mouth every 8 (eight) hours as needed for nausea or vomiting. 60 tablet 2   oxyCODONE-acetaminophen (PERCOCET/ROXICET) 5-325 MG tablet Take 1 tablet by mouth every 12 (twelve) hours as needed for moderate pain. 60 tablet 0   palbociclib (IBRANCE) 100 MG tablet TAKE 1 TABLET (100 MG TOTAL) BY MOUTH DAILY. TAKE FOR 21 DAYS ON, 7 DAYS OFF, REPEAT EVERY 28 DAYS. 42 tablet 3   No current facility-administered medications for this visit.     PHYSICAL EXAMINATION: Performance status (ECOG): 2 - Symptomatic, <50% confined to bed  Vitals:    10/07/22 0951  BP: 128/84  Pulse: 73  Resp: 16  Temp: 98.4 F (36.9 C)  SpO2: 98%   Wt Readings from Last 3 Encounters:  10/07/22 192 lb 9.6 oz (87.4 kg)  06/08/22 185 lb 8 oz (84.1 kg)  02/11/22 180 lb 5.4 oz (81.8 kg)   Physical Exam Vitals reviewed.  Constitutional:      Appearance: Normal appearance.  Cardiovascular:     Rate and Rhythm: Normal rate and regular rhythm.     Pulses: Normal pulses.     Heart sounds: Normal heart sounds.  Pulmonary:     Effort: Pulmonary effort is normal.     Breath sounds: Normal breath sounds.  Neurological:     General: No focal deficit present.     Mental Status: She is alert and oriented to person, place, and time.  Psychiatric:        Mood and Affect: Mood normal.        Behavior: Behavior normal.       LABORATORY DATA:  I have reviewed the data as listed    Latest Ref Rng & Units 09/18/2022    8:30 AM 06/03/2022    9:34 AM 02/04/2022    8:48 AM  CMP  Glucose 70 - 99 mg/dL 94  95  97   BUN 8 - 23 mg/dL '16  22  18   '$ Creatinine 0.44 - 1.00 mg/dL 1.12  1.04  0.92   Sodium 135 - 145 mmol/L 139  140  138   Potassium 3.5 - 5.1 mmol/L 4.1  3.7  3.7   Chloride 98 - 111 mmol/L 102  106  106   CO2 22 - 32 mmol/L '28  26  27   '$ Calcium 8.9 - 10.3 mg/dL 9.0  9.0  8.8   Total Protein 6.5 - 8.1 g/dL 7.4  7.4  7.1   Total Bilirubin 0.3 - 1.2 mg/dL 0.7  0.6  0.2   Alkaline Phos 38 - 126 U/L 80  68  81   AST 15 - 41 U/L '20  16  17   '$ ALT 0 - 44 U/L '15  15  16    '$ Lab Results  Component Value Date   CAN153 21.1 09/18/2022   CAN153 20.0 06/03/2022   VFI433 21.4 02/04/2022   Lab Results  Component Value Date   WBC 3.8 (L) 09/18/2022   HGB 13.2 09/18/2022   HCT 38.9 09/18/2022   MCV 102.1 (H) 09/18/2022   PLT 307 09/18/2022   NEUTROABS 2.0 09/18/2022    ASSESSMENT:  1.  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  She did not seek medical attention.   She did not have mammograms for the past 30 years. -Reported 26 pound weight loss which was intentional in the last 6 months. -Developed fracture of the proximal left humeral diaphysis when she picked up her cat. -CT humerus left without contrast on 05/23/2020 shows minimally displaced oblique fracture of the proximal left humeral diaphysis with no definitive CT evidence of underlying lesion.  Nonspecific left axillary adenopathy. -Physical exam today reveals left breast mass occupying the majority of the outer quadrant with skin thickening.  Could not palpate the axillary lymph node as she was unable to lift her arm. -PET scan on 06/24/2020 showed irregular hypermetabolic 8.1 x 4.0 left breast mass compatible with malignancy with left axillary nodal metastasis.  Extensive multifocal mixed lytic and sclerotic metastatic disease throughout the axial and proximal appendicular skeleton. -Ultrasound-guided left breast biopsy on 07/11/2020 shows invasive mammary carcinoma, grade 2.  ER/PR 90% positive.  Ki-67 60%.  HER-2 2+ by IHC.  HER-2 negative by FISH. -Anastrozole and palbociclib started on 07/19/2020. - Germline mutation testing was negative.   2.  Social/family history: -Lives at home with husband.  Never smoker.  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   PLAN:  1.  Metastatic ER positive left breast cancer to the bones: - She is tolerating Ibrance very well. - CT CAP (09/18/2022): Stable osseous metastatic disease.  Stable asymmetric breast tissue in the left with some tiny left axillary lymph nodes. - Bone scan (09/18/2022): Single focus of uptake in the mid thoracic spine at T8 corresponding to metastatic disease and compression fracture.  Remaining sites of uptake on the previous exam have decreased compared to scan from 11/04/2020. - CA 15-3 is 21.1. - Continue Ibrance 100 mg 3 weeks on/1 week off and anastrozole daily. - RTC 3  months for follow-up.   2.  Bone metastasis: - We discussed the role of denosumab to decrease SRE.  Because of poor dentition we have not started it.   3.  Chronic back pain: - Continue Percocet 1 tablet in the morning.  She occasionally requires another tablet in the evening.   4.  Low vitamin D levels: - Vitamin D level is 17 despite being on vitamin D  2000 units daily. - Will start her on vitamin D 50,000 units weekly.  Will check at next visit.   5.  Systemic thyroid nodule: - CT showed 3.1 cm isthmic thyroid nodule.  We discussed the role of ultrasound.  However we will be monitoring on CT scans on a regular basis.  If there is any significant changes will consider ultrasound.  Breast Cancer therapy associated bone loss: I have recommended calcium, Vitamin D and weight bearing exercises.  Orders placed this encounter:  No orders of the defined types were placed in this encounter.    The patient has a good understanding of the overall plan. She agrees with it. She will call with any problems that may develop before the next visit here.  Derek Jack, MD [facility] [phone]

## 2022-10-07 NOTE — Progress Notes (Signed)
Patient is taking Ibrance as prescribed.  She has not missed any doses and reports no side effects at this time.

## 2022-10-07 NOTE — Patient Instructions (Addendum)
[facility] at [facility] Discharge Instructions   You were seen and examined today by Dr. Delton Coombes.  He reviewed the results of your lab work which are mostly normal/stable. Your vitamin D is very low at 17. You need to take vitamin D daily.   He reviewed the results of your CT scan and bone scan which are stable.   Continue Ibrance and anastrozole as prescribed.   We will see you back in 3 months    Thank you for choosing [facility] at [facility] to provide your oncology and hematology care.  To afford each patient quality time with our provider, please arrive at least 15 minutes before your scheduled appointment time.   If you have a lab appointment with the [facility] please come in thru the Main Entrance and check in at the main information desk.  You need to re-schedule your appointment should you arrive 10 or more minutes late.  We strive to give you quality time with our providers, and arriving late affects you and other patients whose appointments are after yours.  Also, if you no show three or more times for appointments you may be dismissed from the clinic at the providers discretion.     Again, thank you for choosing [facility].  Our hope is that these requests will decrease the amount of time that you wait before being seen by our physicians.       _____________________________________________________________  Should you have questions after your visit to [facility], please contact our office at [phone] and follow the prompts.  Our office hours are 8:00 a.m. and 4:30 p.m. Monday - Friday.  Please note that voicemails left after 4:00 p.m. may not be returned until the following business day.  We are closed weekends and major holidays.  You do have access to a nurse 24-7, just call the main number to the clinic [phone] and do not press any options, hold on the line and a nurse will answer  the phone.    For prescription refill requests, have your pharmacy contact our office and allow 72 hours.    Due to Covid, you will need to wear a mask upon entering the hospital. If you do not have a mask, a mask will be given to you at the Main Entrance upon arrival. For doctor visits, patients may have 1 support person age 27 or older with them. For treatment visits, patients can not have anyone with them due to social distancing guidelines and our immunocompromised population.

## 2022-10-29 ENCOUNTER — Other Ambulatory Visit: Payer: Self-pay

## 2022-10-29 DIAGNOSIS — G893 Neoplasm related pain (acute) (chronic): Secondary | ICD-10-CM

## 2022-10-29 DIAGNOSIS — C7951 Secondary malignant neoplasm of bone: Secondary | ICD-10-CM

## 2022-10-29 MED ORDER — OXYCODONE-ACETAMINOPHEN 5-325 MG PO TABS: 1.0000 | ORAL_TABLET | Freq: Two times a day (BID) | ORAL | 0 refills | Status: DC | PRN

## 2022-12-01 ENCOUNTER — Other Ambulatory Visit: Payer: Self-pay | Admitting: *Deleted

## 2022-12-01 DIAGNOSIS — G893 Neoplasm related pain (acute) (chronic): Secondary | ICD-10-CM

## 2022-12-01 DIAGNOSIS — C7951 Secondary malignant neoplasm of bone: Secondary | ICD-10-CM

## 2022-12-02 MED ORDER — OXYCODONE-ACETAMINOPHEN 5-325 MG PO TABS: 1.0000 | ORAL_TABLET | Freq: Two times a day (BID) | ORAL | 0 refills | Status: DC | PRN

## 2022-12-22 ENCOUNTER — Other Ambulatory Visit: Payer: Self-pay | Admitting: *Deleted

## 2022-12-22 DIAGNOSIS — G893 Neoplasm related pain (acute) (chronic): Secondary | ICD-10-CM

## 2022-12-22 DIAGNOSIS — C7951 Secondary malignant neoplasm of bone: Secondary | ICD-10-CM

## 2022-12-22 MED ORDER — OXYCODONE-ACETAMINOPHEN 5-325 MG PO TABS: 1.0000 | ORAL_TABLET | Freq: Two times a day (BID) | ORAL | 0 refills | Status: DC | PRN

## 2022-12-23 ENCOUNTER — Telehealth: Payer: Self-pay | Admitting: *Deleted

## 2022-12-23 NOTE — Telephone Encounter (Signed)
Patient called and right arm pain 7/10. Shoulder popped last night and has had the pain since extending into the elbow with intermittent numbness in fingers.  Per Dr. Ellin Saba will place a STAT referral to orthopedics.

## 2022-12-24 ENCOUNTER — Other Ambulatory Visit: Payer: Self-pay | Admitting: *Deleted

## 2022-12-28 ENCOUNTER — Telehealth: Payer: Self-pay | Admitting: Orthopedic Surgery

## 2022-12-28 NOTE — Telephone Encounter (Signed)
Nope   She should stay at [facility] I dont do the kind of surgery she would need

## 2022-12-28 NOTE — Telephone Encounter (Signed)
Phone call from Kaitlin Baker at cancer center put in referral In meantime patient was evaluated at [facility] Dr Kirtland Bouchard wants her to still see you I advised get office notes and xrays on disc Then ok to schedule

## 2022-12-29 NOTE — Telephone Encounter (Signed)
Dr Felecia Jan wanted Dr Romeo Apple to see patient but it would be best if she remains a patient at [facility], he states he does not do the surgery she needs  To Dr Linda Hedges

## 2023-01-05 ENCOUNTER — Inpatient Hospital Stay: Payer: Medicare Other | Attending: Hematology

## 2023-01-05 DIAGNOSIS — Z803 Family history of malignant neoplasm of breast: Secondary | ICD-10-CM | POA: Diagnosis not present

## 2023-01-05 DIAGNOSIS — G8929 Other chronic pain: Secondary | ICD-10-CM | POA: Diagnosis not present

## 2023-01-05 DIAGNOSIS — E041 Nontoxic single thyroid nodule: Secondary | ICD-10-CM | POA: Diagnosis not present

## 2023-01-05 DIAGNOSIS — E559 Vitamin D deficiency, unspecified: Secondary | ICD-10-CM | POA: Insufficient documentation

## 2023-01-05 DIAGNOSIS — Z800 Family history of malignant neoplasm of digestive organs: Secondary | ICD-10-CM | POA: Diagnosis not present

## 2023-01-05 DIAGNOSIS — Z170 Estrogen receptor positive status [ER+]: Secondary | ICD-10-CM | POA: Diagnosis not present

## 2023-01-05 DIAGNOSIS — M549 Dorsalgia, unspecified: Secondary | ICD-10-CM | POA: Insufficient documentation

## 2023-01-05 DIAGNOSIS — Z79811 Long term (current) use of aromatase inhibitors: Secondary | ICD-10-CM | POA: Insufficient documentation

## 2023-01-05 DIAGNOSIS — C50912 Malignant neoplasm of unspecified site of left female breast: Secondary | ICD-10-CM | POA: Diagnosis present

## 2023-01-05 DIAGNOSIS — C7951 Secondary malignant neoplasm of bone: Secondary | ICD-10-CM | POA: Diagnosis not present

## 2023-01-05 DIAGNOSIS — Z808 Family history of malignant neoplasm of other organs or systems: Secondary | ICD-10-CM | POA: Diagnosis not present

## 2023-01-05 LAB — CBC WITH DIFFERENTIAL/PLATELET
Abs Immature Granulocytes: 0.02 10*3/uL (ref 0.00–0.07)
Basophils Absolute: 0.1 10*3/uL (ref 0.0–0.1)
Basophils Relative: 3 %
Eosinophils Absolute: 0.1 10*3/uL (ref 0.0–0.5)
Eosinophils Relative: 2 %
HCT: 38 % (ref 36.0–46.0)
Hemoglobin: 13.4 g/dL (ref 12.0–15.0)
Immature Granulocytes: 1 %
Lymphocytes Relative: 44 %
Lymphs Abs: 1.5 10*3/uL (ref 0.7–4.0)
MCH: 35.4 pg — ABNORMAL HIGH (ref 26.0–34.0)
MCHC: 35.3 g/dL (ref 30.0–36.0)
MCV: 100.3 fL — ABNORMAL HIGH (ref 80.0–100.0)
Monocytes Absolute: 0.3 10*3/uL (ref 0.1–1.0)
Monocytes Relative: 9 %
Neutro Abs: 1.4 10*3/uL — ABNORMAL LOW (ref 1.7–7.7)
Neutrophils Relative %: 41 %
Platelets: 237 10*3/uL (ref 150–400)
RBC: 3.79 MIL/uL — ABNORMAL LOW (ref 3.87–5.11)
RDW: 13.7 % (ref 11.5–15.5)
WBC: 3.4 10*3/uL — ABNORMAL LOW (ref 4.0–10.5)
nRBC: 0 % (ref 0.0–0.2)

## 2023-01-05 LAB — COMPREHENSIVE METABOLIC PANEL
ALT: 21 U/L (ref 0–44)
AST: 21 U/L (ref 15–41)
Albumin: 3.9 g/dL (ref 3.5–5.0)
Alkaline Phosphatase: 88 U/L (ref 38–126)
Anion gap: 9 (ref 5–15)
BUN: 16 mg/dL (ref 8–23)
CO2: 25 mmol/L (ref 22–32)
Calcium: 9.3 mg/dL (ref 8.9–10.3)
Chloride: 103 mmol/L (ref 98–111)
Creatinine, Ser: 1.03 mg/dL — ABNORMAL HIGH (ref 0.44–1.00)
GFR, Estimated: 59 mL/min — ABNORMAL LOW (ref >60)
Glucose, Bld: 99 mg/dL (ref 70–99)
Potassium: 3.8 mmol/L (ref 3.5–5.1)
Sodium: 137 mmol/L (ref 135–145)
Total Bilirubin: 0.6 mg/dL (ref 0.3–1.2)
Total Protein: 7.3 g/dL (ref 6.5–8.1)

## 2023-01-05 LAB — VITAMIN D 25 HYDROXY (VIT D DEFICIENCY, FRACTURES): Vit D, 25-Hydroxy: 59.67 ng/mL (ref 30–100)

## 2023-01-07 ENCOUNTER — Ambulatory Visit: Payer: Medicare Other | Admitting: Orthopedic Surgery

## 2023-01-07 LAB — CANCER ANTIGEN 15-3: CA 15-3: 25.9 U/mL — ABNORMAL HIGH (ref 0.0–25.0)

## 2023-01-11 NOTE — Progress Notes (Signed)
[facility] [address], [place] [address]    Clinic Day:  01/12/2023  Referring physician: No ref. provider found  Patient Care Team: Default, Provider, MD as PCP - General [name], MD as Medical Oncologist (Medical Oncology)   ASSESSMENT & PLAN:   Assessment: 1.  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  She did not seek medical attention.  She did not have mammograms for the past 30 years. -Reported 26 pound weight loss which was intentional in the last 6 months. -Developed fracture of the proximal left humeral diaphysis when she picked up her cat. -CT humerus left without contrast on 05/23/2020 shows minimally displaced oblique fracture of the proximal left humeral diaphysis with no definitive CT evidence of underlying lesion.  Nonspecific left axillary adenopathy. -Physical exam today reveals left breast mass occupying the majority of the outer quadrant with skin thickening.  Could not palpate the axillary lymph node as she was unable to lift her arm. -PET scan on 06/24/2020 showed irregular hypermetabolic 8.1 x 4.0 left breast mass compatible with malignancy with left axillary nodal metastasis.  Extensive multifocal mixed lytic and sclerotic metastatic disease throughout the axial and proximal appendicular skeleton. -Ultrasound-guided left breast biopsy on 07/11/2020 shows invasive mammary carcinoma, grade 2.  ER/PR 90% positive.  Ki-67 60%.  HER-2 2+ by IHC.  HER-2 negative by FISH. -Anastrozole and palbociclib started on 07/19/2020. - Germline mutation testing was negative.   2.  Social/family history: -Lives at home with husband.  Never smoker.  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    Plan: 1.  Metastatic ER positive left breast cancer to the bones: - She is tolerating Ibrance and anastrozole  very well. - CT CAP: Stable osseous metastatic disease.  Stable asymmetric breast tissue in the left and some tiny left axillary lymph nodes. - Bone scan: Single new focus of uptake in the mid thoracic spine at T8. - Latest labs showed elevated CA 15-3 of 25.9. - I have also reviewed records from [facility] which showed new lytic lesion in the right humerus. - I think she will benefit from surgical intervention with nailing. - I have recommended PET scan as a baseline as she is progressing on the current regimen. - I have also recommended Guardant360 for ESR 1 mutation testing. - Options include Elacestrant/Capivasertib depending on the mutation status.  Otherwise she will be a candidate for Enhertu.   2.  Bone metastasis: - Denosumab was discussed to decrease SRE.  Because of poor dentition, it was not started.   3.  Chronic back pain: - Continue Percocet 1 tablet in the morning and occasionally second tablet in the evening.   4.  Low vitamin D levels: - Continue vitamin D weekly.  Vitamin D level improved to 59 from 17..   5.  Systemic thyroid nodule: - CT scan showed 3.1 cm ischemic thyroid nodule.  We will follow it up on PET scan.    Orders Placed This Encounter  Procedures   NM PET Image Restag (PS) Skull Base To Thigh    Standing Status:   Future    Standing Expiration Date:   01/12/2024    Order Specific Question:   If indicated for the ordered procedure, I authorize the administration of a radiopharmaceutical per Radiology protocol    Answer:   Yes  Order Specific Question:   Preferred imaging location?    Answer:   [facility]    Order Specific Question:   Release to patient    Answer:   Immediate      I,Katie Daubenspeck,acting as a scribe for [name], MD.,have documented all relevant documentation on the behalf of [name], MD,as directed by  [name], MD while in the presence of [name], MD.   I, [name]  MD, have reviewed the above documentation for accuracy and completeness, and I agree with the above.   [name], MD   4/30/20245:46 PM  CHIEF COMPLAINT:   Diagnosis: metastatic left breast cancer    Cancer Staging  No matching staging information was found for the patient.   Prior Therapy: none  Current Therapy:  Anastrozole, Ibrance   HISTORY OF PRESENT ILLNESS:   Oncology History   No history exists.     INTERVAL HISTORY:   Kaitlin Baker is a 67 y.o. female presenting to clinic today for follow up of metastatic breast cancer . She was last seen by me on 10/07/22.  Today, she states that she is doing well overall. Her appetite level is at 100%. Her energy level is at 100%.  PAST MEDICAL HISTORY:   Past Medical History: Past Medical History:  Diagnosis Date   Breast cancer (HCC)    Family history of breast cancer    High cholesterol     Surgical History: Past Surgical History:  Procedure Laterality Date   CHOLECYSTECTOMY     neck surgery     plate placed    Social History: Social History   Socioeconomic History   Marital status: Unknown    Spouse name: Not on file   Number of children: 2   Years of education: Not on file   Highest education level: Not on file  Occupational History   Occupation: retired  Tobacco Use   Smoking status: Never   Smokeless tobacco: Never  Vaping Use   Vaping Use: Never used  Substance and Sexual Activity   Alcohol use: Never   Drug use: Never   Sexual activity: Not Currently  Other Topics Concern   Not on file  Social History Narrative   Not on file   Social Determinants of Health   Financial Resource Strain: Low Risk  (07/17/2020)   Overall Financial Resource Strain (CARDIA)    Difficulty of Paying Living Expenses: Not hard at all  Food Insecurity: No Food Insecurity (07/17/2020)   Hunger Vital Sign    Worried About Running Out of Food in the Last Year: Never true    Ran Out of Food in the Last Year: Never  true  Transportation Needs: No Transportation Needs (07/17/2020)   PRAPARE - Administrator, Civil Service (Medical): No    Lack of Transportation (Non-Medical): No  Physical Activity: Inactive (07/17/2020)   Exercise Vital Sign    Days of Exercise per Week: 0 days    Minutes of Exercise per Session: 0 min  Stress: No Stress Concern Present (07/17/2020)   [employer] of Occupational Health - Occupational Stress Questionnaire    Feeling of Stress : Not at all  Social Connections: Moderately Integrated (07/17/2020)   Social Connection and Isolation Panel [NHANES]    Frequency of Communication with Friends and Family: Three times a week    Frequency of Social Gatherings with Friends and Family: Three times a week    Attends Religious Services: 1 to 4 times  per year    Active Member of Clubs or Organizations: No    Attends Banker Meetings: Never    Marital Status: Married  Catering manager Violence: Not At Risk (07/17/2020)   Humiliation, Afraid, Rape, and Kick questionnaire    Fear of Current or Ex-Partner: No    Emotionally Abused: No    Physically Abused: No    Sexually Abused: No    Family History: Family History  Adopted: Yes  Problem Relation Age of Onset   Breast cancer Mother        dx 61s   Brain cancer Father        vs brain tumor, d. 14   Breast cancer Sister        dx 5   Breast cancer Sister        dx 72s   Liver cancer Brother    Alcohol abuse Brother    Asthma Son     Current Medications:  Current Outpatient Medications:    anastrozole (ARIMIDEX) 1 MG tablet, Take 1 tablet (1 mg total) by mouth daily., Disp: 90 tablet, Rfl: 3   ergocalciferol (VITAMIN D2) 1.25 MG (50000 UT) capsule, Take 1 capsule (50,000 Units total) by mouth once a week., Disp: 26 capsule, Rfl: 1   ondansetron (ZOFRAN) 8 MG tablet, Take 1 tablet (8 mg total) by mouth every 8 (eight) hours as needed for nausea or vomiting., Disp: 60 tablet, Rfl: 2    oxyCODONE-acetaminophen (PERCOCET/ROXICET) 5-325 MG tablet, Take 1 tablet by mouth every 12 (twelve) hours as needed for moderate pain., Disp: 60 tablet, Rfl: 0   palbociclib (IBRANCE) 100 MG tablet, TAKE 1 TABLET (100 MG TOTAL) BY MOUTH DAILY. TAKE FOR 21 DAYS ON, 7 DAYS OFF, REPEAT EVERY 28 DAYS., Disp: 42 tablet, Rfl: 3   Allergies: Allergies  Allergen Reactions   Codeine Nausea And Vomiting    REVIEW OF SYSTEMS:   Review of Systems  Constitutional:  Negative for chills, fatigue and fever.  HENT:   Negative for lump/mass, mouth sores, nosebleeds, sore throat and trouble swallowing.   Eyes:  Negative for eye problems.  Respiratory:  Negative for cough and shortness of breath.   Cardiovascular:  Negative for chest pain, leg swelling and palpitations.  Gastrointestinal:  Negative for abdominal pain, constipation, diarrhea, nausea and vomiting.  Genitourinary:  Negative for bladder incontinence, difficulty urinating, dysuria, frequency, hematuria and nocturia.   Musculoskeletal:  Negative for arthralgias, back pain, flank pain, myalgias and neck pain.  Skin:  Negative for itching and rash.  Neurological:  Negative for dizziness, headaches and numbness.  Hematological:  Does not bruise/bleed easily.  Psychiatric/Behavioral:  Negative for depression, sleep disturbance and suicidal ideas. The patient is not nervous/anxious.   All other systems reviewed and are negative.    VITALS:   Blood pressure (!) 155/86, pulse 79, temperature 98.4 F (36.9 C), temperature source Tympanic, resp. rate 18, height 5\' 4"  (1.626 m), weight 193 lb 12.6 oz (87.9 kg), SpO2 95 %.  Wt Readings from Last 3 Encounters:  01/12/23 193 lb 12.6 oz (87.9 kg)  10/07/22 192 lb 9.6 oz (87.4 kg)  06/08/22 185 lb 8 oz (84.1 kg)    Body mass index is 33.26 kg/m.  Performance status (ECOG): 1 - Symptomatic but completely ambulatory  PHYSICAL EXAM:   Physical Exam Vitals and nursing note reviewed. Exam conducted  with a chaperone present.  Constitutional:      Appearance: Normal appearance.  Cardiovascular:  Rate and Rhythm: Normal rate and regular rhythm.     Pulses: Normal pulses.     Heart sounds: Normal heart sounds.  Pulmonary:     Effort: Pulmonary effort is normal.     Breath sounds: Normal breath sounds.  Abdominal:     Palpations: Abdomen is soft. There is no hepatomegaly, splenomegaly or mass.     Tenderness: There is no abdominal tenderness.  Musculoskeletal:     Right lower leg: No edema.     Left lower leg: No edema.  Lymphadenopathy:     Cervical: No cervical adenopathy.     Right cervical: No superficial, deep or posterior cervical adenopathy.    Left cervical: No superficial, deep or posterior cervical adenopathy.     Upper Body:     Right upper body: No supraclavicular or axillary adenopathy.     Left upper body: No supraclavicular or axillary adenopathy.  Neurological:     General: No focal deficit present.     Mental Status: She is alert and oriented to person, place, and time.  Psychiatric:        Mood and Affect: Mood normal.        Behavior: Behavior normal.     LABS:      Latest Ref Rng & Units 01/05/2023   10:18 AM 09/18/2022    8:30 AM 06/03/2022    9:34 AM  CBC  WBC 4.0 - 10.5 K/uL 3.4  3.8  3.4   Hemoglobin 12.0 - 15.0 g/dL 78.2  95.6  21.3   Hematocrit 36.0 - 46.0 % 38.0  38.9  38.6   Platelets 150 - 400 K/uL 237  307  290       Latest Ref Rng & Units 01/05/2023   10:18 AM 09/18/2022    8:30 AM 06/03/2022    9:34 AM  CMP  Glucose 70 - 99 mg/dL 99  94  95   BUN 8 - 23 mg/dL 16  16  22    Creatinine 0.44 - 1.00 mg/dL 0.86  5.78  4.69   Sodium 135 - 145 mmol/L 137  139  140   Potassium 3.5 - 5.1 mmol/L 3.8  4.1  3.7   Chloride 98 - 111 mmol/L 103  102  106   CO2 22 - 32 mmol/L 25  28  26    Calcium 8.9 - 10.3 mg/dL 9.3  9.0  9.0   Total Protein 6.5 - 8.1 g/dL 7.3  7.4  7.4   Total Bilirubin 0.3 - 1.2 mg/dL 0.6  0.7  0.6   Alkaline Phos 38 - 126  U/L 88  80  68   AST 15 - 41 U/L 21  20  16    ALT 0 - 44 U/L 21  15  15       No results found for: "CEA1", "CEA" / No results found for: "CEA1", "CEA" No results found for: "PSA1" No results found for: "GEX528" No results found for: "CAN125"  No results found for: "TOTALPROTELP", "ALBUMINELP", "A1GS", "A2GS", "BETS", "BETA2SER", "GAMS", "MSPIKE", "SPEI" No results found for: "TIBC", "FERRITIN", "IRONPCTSAT" No results found for: "LDH"   STUDIES:   No results found.

## 2023-01-12 ENCOUNTER — Inpatient Hospital Stay: Payer: Medicare Other | Admitting: Hematology

## 2023-01-12 ENCOUNTER — Inpatient Hospital Stay: Payer: Medicare Other

## 2023-01-12 VITALS — BP 155/86 | HR 79 | Temp 98.4°F | Resp 18 | Ht 64.0 in | Wt 193.8 lb

## 2023-01-12 DIAGNOSIS — C7951 Secondary malignant neoplasm of bone: Secondary | ICD-10-CM | POA: Diagnosis not present

## 2023-01-12 DIAGNOSIS — C50912 Malignant neoplasm of unspecified site of left female breast: Secondary | ICD-10-CM | POA: Diagnosis not present

## 2023-01-12 NOTE — Progress Notes (Signed)
Patient is taking Ibrance as prescribed.  She has not missed any doses and reports no side effects at this time.   °

## 2023-01-12 NOTE — Patient Instructions (Addendum)
[facility] Discharge Instructions   You were seen and examined today by Dr. Ellin Saba.  He reviewed the results of your lab work. Your CA 15-3 has gone up to 25 (up from 21). All other results are normal/stable.   We will send a special blood test called Guardant 360 on you today. This will help [place] determine if the cancer you have has any mutations we can target with treatment.   We will obtain a PET scan soon to evaluate how much the cancer has spread since your last scan.   Return as scheduled.    Thank you for choosing [facility] at [facility] to provide your oncology and hematology care.  To afford each patient quality time with our provider, please arrive at least 15 minutes before your scheduled appointment time.   If you have a lab appointment with the Cancer Center please come in thru the Main Entrance and check in at the main information desk.  You need to re-schedule your appointment should you arrive 10 or more minutes late.  We strive to give you quality time with our providers, and arriving late affects you and other patients whose appointments are after yours.  Also, if you no show three or more times for appointments you may be dismissed from the clinic at the providers discretion.     Again, thank you for choosing [facility].  Our hope is that these requests will decrease the amount of time that you wait before being seen by our physicians.       _____________________________________________________________  Should you have questions after your visit to [facility], please contact our office at [phone] and follow the prompts.  Our office hours are 8:00 a.m. and 4:30 p.m. Monday - Friday.  Please note that voicemails left after 4:00 p.m. may not be returned until the following business day.  We are closed weekends and major holidays.  You do have access to a nurse 24-7, just call the  main number to the clinic [phone] and do not press any options, hold on the line and a nurse will answer the phone.    For prescription refill requests, have your pharmacy contact our office and allow 72 hours.    Due to Covid, you will need to wear a mask upon entering the hospital. If you do not have a mask, a mask will be given to you at the Main Entrance upon arrival. For doctor visits, patients may have 1 support person age 80 or older with them. For treatment visits, patients can not have anyone with them due to social distancing guidelines and our immunocompromised population.

## 2023-01-21 ENCOUNTER — Other Ambulatory Visit: Payer: Self-pay

## 2023-01-21 ENCOUNTER — Encounter (HOSPITAL_COMMUNITY): Payer: Self-pay | Admitting: Orthopedic Surgery

## 2023-01-21 ENCOUNTER — Encounter (HOSPITAL_COMMUNITY)
Admission: RE | Admit: 2023-01-21 | Discharge: 2023-01-21 | Disposition: A | Discharge status: Home / Self Care | Payer: Medicare Other | Source: Ambulatory Visit | Attending: Hematology | Admitting: Hematology

## 2023-01-21 DIAGNOSIS — C7951 Secondary malignant neoplasm of bone: Secondary | ICD-10-CM | POA: Diagnosis present

## 2023-01-21 MED ORDER — FLUDEOXYGLUCOSE F - 18 (FDG) INJECTION
9.2700 | Freq: Once | INTRAVENOUS | Status: AC | PRN
  Administered 2023-01-21: 9.27 via INTRAVENOUS

## 2023-01-21 NOTE — Progress Notes (Addendum)
Anesthesia Review:  PCP: none  Cardiologist  none  Oncology- Dr Fabiola Backer LOV 01/12/23  Chest x-ray :  CT Chest- 09/20/22  EKG : Echo : Stress test: Cardiac Cath :  Activity level: can do a flgiht of stairs without difficulty  Sleep Study/ CPAP : none  Fasting Blood Sugar :      / Checks Blood Sugar -- times a day:   Blood Thinner/ Instructions /Last Dose: ASA / Instructions/ Last Dose :    CBC and CMP done 01/05/23  Breast cancer with mets   PET  Scan on 01/21/23 at 0830am   Pt aware to shower with dial soap tonite and in am with clean clothes to hospital and clearn sheets on bed nite before and clean pajamas nite before surgery.

## 2023-01-22 ENCOUNTER — Ambulatory Visit (HOSPITAL_COMMUNITY)
Admission: RE | Admit: 2023-01-22 | Discharge: 2023-01-22 | Disposition: A | Discharge status: Home / Self Care | Payer: Medicare Other | Attending: Orthopedic Surgery | Admitting: Orthopedic Surgery

## 2023-01-22 ENCOUNTER — Encounter (HOSPITAL_COMMUNITY)
Admission: RE | Disposition: A | Discharge status: Home / Self Care | Payer: Self-pay | Source: Home / Self Care | Attending: Orthopedic Surgery

## 2023-01-22 ENCOUNTER — Encounter (HOSPITAL_COMMUNITY): Payer: Self-pay | Admitting: Orthopedic Surgery

## 2023-01-22 ENCOUNTER — Ambulatory Visit (HOSPITAL_COMMUNITY): Payer: Medicare Other

## 2023-01-22 ENCOUNTER — Ambulatory Visit (HOSPITAL_COMMUNITY): Payer: Medicare Other | Admitting: Anesthesiology

## 2023-01-22 ENCOUNTER — Ambulatory Visit (HOSPITAL_BASED_OUTPATIENT_CLINIC_OR_DEPARTMENT_OTHER): Payer: Medicare Other | Admitting: Anesthesiology

## 2023-01-22 ENCOUNTER — Other Ambulatory Visit: Payer: Self-pay

## 2023-01-22 DIAGNOSIS — G893 Neoplasm related pain (acute) (chronic): Secondary | ICD-10-CM

## 2023-01-22 DIAGNOSIS — C50911 Malignant neoplasm of unspecified site of right female breast: Secondary | ICD-10-CM | POA: Diagnosis not present

## 2023-01-22 DIAGNOSIS — Z853 Personal history of malignant neoplasm of breast: Secondary | ICD-10-CM | POA: Insufficient documentation

## 2023-01-22 DIAGNOSIS — C7951 Secondary malignant neoplasm of bone: Secondary | ICD-10-CM | POA: Diagnosis present

## 2023-01-22 DIAGNOSIS — Z79899 Other long term (current) drug therapy: Secondary | ICD-10-CM | POA: Diagnosis not present

## 2023-01-22 DIAGNOSIS — M84521A Pathological fracture in neoplastic disease, right humerus, initial encounter for fracture: Secondary | ICD-10-CM | POA: Diagnosis not present

## 2023-01-22 DIAGNOSIS — Z803 Family history of malignant neoplasm of breast: Secondary | ICD-10-CM | POA: Diagnosis not present

## 2023-01-22 DIAGNOSIS — E7800 Pure hypercholesterolemia, unspecified: Secondary | ICD-10-CM | POA: Diagnosis not present

## 2023-01-22 DIAGNOSIS — Z800 Family history of malignant neoplasm of digestive organs: Secondary | ICD-10-CM | POA: Insufficient documentation

## 2023-01-22 HISTORY — DX: Nausea with vomiting, unspecified: R11.2

## 2023-01-22 HISTORY — PX: HUMERUS IM NAIL: SHX1769

## 2023-01-22 HISTORY — DX: Other specified postprocedural states: Z98.890

## 2023-01-22 HISTORY — DX: Unspecified osteoarthritis, unspecified site: M19.90

## 2023-01-22 SURGERY — INSERTION, INTRAMEDULLARY ROD, HUMERUS
Anesthesia: General | Site: Arm Upper | Laterality: Right

## 2023-01-22 MED ORDER — LIDOCAINE HCL (CARDIAC) PF 100 MG/5ML IV SOSY
PREFILLED_SYRINGE | INTRAVENOUS | Status: DC | PRN
  Administered 2023-01-22: 60 mg via INTRAVENOUS

## 2023-01-22 MED ORDER — OXYCODONE HCL 5 MG/5ML PO SOLN: 5.0000 mg | Freq: Once | ORAL | Status: DC | PRN

## 2023-01-22 MED ORDER — 0.9 % SODIUM CHLORIDE (POUR BTL) OPTIME
TOPICAL | Status: DC | PRN
  Administered 2023-01-22: 1000 mL

## 2023-01-22 MED ORDER — FENTANYL CITRATE PF 50 MCG/ML IJ SOSY
PREFILLED_SYRINGE | INTRAMUSCULAR | Status: AC
  Filled 2023-01-22: qty 2

## 2023-01-22 MED ORDER — PROPOFOL 10 MG/ML IV BOLUS
INTRAVENOUS | Status: DC | PRN
  Administered 2023-01-22: 50 mg via INTRAVENOUS
  Administered 2023-01-22: 100 mg via INTRAVENOUS

## 2023-01-22 MED ORDER — BUPIVACAINE-EPINEPHRINE (PF) 0.25% -1:200000 IJ SOLN
INTRAMUSCULAR | Status: AC
  Filled 2023-01-22: qty 30

## 2023-01-22 MED ORDER — DEXAMETHASONE SODIUM PHOSPHATE 10 MG/ML IJ SOLN
INTRAMUSCULAR | Status: AC
  Filled 2023-01-22: qty 1

## 2023-01-22 MED ORDER — EPHEDRINE 5 MG/ML INJ
INTRAVENOUS | Status: AC
  Filled 2023-01-22: qty 5

## 2023-01-22 MED ORDER — BUPIVACAINE HCL (PF) 0.5 % IJ SOLN
INTRAMUSCULAR | Status: AC
  Filled 2023-01-22: qty 30

## 2023-01-22 MED ORDER — SUGAMMADEX SODIUM 200 MG/2ML IV SOLN
INTRAVENOUS | Status: DC | PRN
  Administered 2023-01-22: 200 mg via INTRAVENOUS

## 2023-01-22 MED ORDER — FENTANYL CITRATE PF 50 MCG/ML IJ SOSY
25.0000 ug | PREFILLED_SYRINGE | INTRAMUSCULAR | Status: DC | PRN
  Administered 2023-01-22: 25 ug via INTRAVENOUS

## 2023-01-22 MED ORDER — LACTATED RINGERS IV SOLN: INTRAVENOUS | Status: DC

## 2023-01-22 MED ORDER — MIDAZOLAM HCL 2 MG/2ML IJ SOLN
INTRAMUSCULAR | Status: AC
  Administered 2023-01-22: 1 mg via INTRAVENOUS
  Filled 2023-01-22: qty 2

## 2023-01-22 MED ORDER — EPHEDRINE SULFATE (PRESSORS) 50 MG/ML IJ SOLN
INTRAMUSCULAR | Status: DC | PRN
  Administered 2023-01-22: 5 mg via INTRAVENOUS

## 2023-01-22 MED ORDER — ACETAMINOPHEN 500 MG PO TABS
ORAL_TABLET | ORAL | Status: AC
  Administered 2023-01-22: 1000 mg via ORAL
  Filled 2023-01-22: qty 2

## 2023-01-22 MED ORDER — BUPIVACAINE LIPOSOME 1.3 % IJ SUSP
INTRAMUSCULAR | Status: DC | PRN
  Administered 2023-01-22: 10 mL via PERINEURAL

## 2023-01-22 MED ORDER — ROCURONIUM BROMIDE 10 MG/ML (PF) SYRINGE
PREFILLED_SYRINGE | INTRAVENOUS | Status: AC
  Filled 2023-01-22: qty 10

## 2023-01-22 MED ORDER — CEFAZOLIN SODIUM-DEXTROSE 2-4 GM/100ML-% IV SOLN
2.0000 g | INTRAVENOUS | Status: AC
  Administered 2023-01-22: 2 g via INTRAVENOUS
  Filled 2023-01-22: qty 100

## 2023-01-22 MED ORDER — OXYCODONE HCL 5 MG PO TABS: 5.0000 mg | ORAL_TABLET | Freq: Once | ORAL | Status: DC | PRN

## 2023-01-22 MED ORDER — FENTANYL CITRATE PF 50 MCG/ML IJ SOSY
PREFILLED_SYRINGE | INTRAMUSCULAR | Status: AC
  Administered 2023-01-22: 50 ug via INTRAVENOUS
  Filled 2023-01-22: qty 2

## 2023-01-22 MED ORDER — MIDAZOLAM HCL 2 MG/2ML IJ SOLN: 1.0000 mg | INTRAMUSCULAR | Status: DC

## 2023-01-22 MED ORDER — AMISULPRIDE (ANTIEMETIC) 5 MG/2ML IV SOLN: 10.0000 mg | Freq: Once | INTRAVENOUS | Status: DC | PRN

## 2023-01-22 MED ORDER — ACETAMINOPHEN 500 MG PO TABS: 1000.0000 mg | ORAL_TABLET | Freq: Once | ORAL | Status: AC

## 2023-01-22 MED ORDER — FENTANYL CITRATE (PF) 100 MCG/2ML IJ SOLN
INTRAMUSCULAR | Status: AC
  Filled 2023-01-22: qty 2

## 2023-01-22 MED ORDER — LIDOCAINE HCL (PF) 2 % IJ SOLN
INTRAMUSCULAR | Status: AC
  Filled 2023-01-22: qty 5

## 2023-01-22 MED ORDER — FENTANYL CITRATE PF 50 MCG/ML IJ SOSY: 50.0000 ug | PREFILLED_SYRINGE | INTRAMUSCULAR | Status: DC

## 2023-01-22 MED ORDER — PROPOFOL 10 MG/ML IV BOLUS
INTRAVENOUS | Status: AC
  Filled 2023-01-22: qty 20

## 2023-01-22 MED ORDER — ONDANSETRON HCL 4 MG/2ML IJ SOLN
INTRAMUSCULAR | Status: AC
  Filled 2023-01-22: qty 2

## 2023-01-22 MED ORDER — ONDANSETRON HCL 4 MG PO TABS: 4.0000 mg | ORAL_TABLET | Freq: Three times a day (TID) | ORAL | 0 refills | Status: DC | PRN

## 2023-01-22 MED ORDER — CHLORHEXIDINE GLUCONATE 0.12 % MT SOLN
15.0000 mL | Freq: Once | OROMUCOSAL | Status: AC
  Administered 2023-01-22: 15 mL via OROMUCOSAL

## 2023-01-22 MED ORDER — ROCURONIUM BROMIDE 100 MG/10ML IV SOLN
INTRAVENOUS | Status: DC | PRN
  Administered 2023-01-22: 50 mg via INTRAVENOUS

## 2023-01-22 MED ORDER — BUPIVACAINE-EPINEPHRINE (PF) 0.5% -1:200000 IJ SOLN
INTRAMUSCULAR | Status: DC | PRN
  Administered 2023-01-22: 15 mL via PERINEURAL

## 2023-01-22 MED ORDER — ORAL CARE MOUTH RINSE: 15.0000 mL | Freq: Once | OROMUCOSAL | Status: AC

## 2023-01-22 MED ORDER — DEXAMETHASONE SODIUM PHOSPHATE 10 MG/ML IJ SOLN
INTRAMUSCULAR | Status: DC | PRN
  Administered 2023-01-22: 5 mg via INTRAVENOUS

## 2023-01-22 MED ORDER — FENTANYL CITRATE (PF) 100 MCG/2ML IJ SOLN
INTRAMUSCULAR | Status: DC | PRN
  Administered 2023-01-22 (×2): 50 ug via INTRAVENOUS

## 2023-01-22 SURGICAL SUPPLY — 54 items
BAG COUNTER SPONGE SURGICOUNT (BAG) IMPLANT
BAG SPEC THK2 15X12 ZIP CLS (MISCELLANEOUS)
BAG SPNG CNTER NS LX DISP (BAG)
BAG ZIPLOCK 12X15 (MISCELLANEOUS) ×1 IMPLANT
BIT DRILL 3.8X270 (BIT) IMPLANT
BIT DRILL SHORT 3.2MM (DRILL) IMPLANT
BOOTIES KNEE HIGH SLOAN (MISCELLANEOUS) ×2 IMPLANT
COVER SURGICAL LIGHT HANDLE (MISCELLANEOUS) ×1 IMPLANT
DRAPE POUCH INSTRU U-SHP 10X18 (DRAPES) ×1 IMPLANT
DRAPE SURG 17X11 SM STRL (DRAPES) ×1 IMPLANT
DRAPE U-SHAPE 47X51 STRL (DRAPES) ×1 IMPLANT
DRILL SHORT 3.2MM (DRILL) ×1
DRSG EMULSION OIL 3X3 NADH (GAUZE/BANDAGES/DRESSINGS) ×1 IMPLANT
DRSG TEGADERM 4X4.75 (GAUZE/BANDAGES/DRESSINGS) IMPLANT
DURAPREP 26ML APPLICATOR (WOUND CARE) ×1 IMPLANT
ELECT REM PT RETURN 15FT ADLT (MISCELLANEOUS) ×1 IMPLANT
GAUZE PAD ABD 8X10 STRL (GAUZE/BANDAGES/DRESSINGS) ×2 IMPLANT
GAUZE SPONGE 4X4 12PLY STRL (GAUZE/BANDAGES/DRESSINGS) ×1 IMPLANT
GLOVE BIOGEL M 7.0 STRL (GLOVE) IMPLANT
GLOVE BIOGEL PI IND STRL 7.5 (GLOVE) ×1 IMPLANT
GLOVE BIOGEL PI IND STRL 8.5 (GLOVE) ×1 IMPLANT
GLOVE ECLIPSE 8.0 STRL XLNG CF (GLOVE) IMPLANT
GLOVE SURG ORTHO 8.0 STRL STRW (GLOVE) ×1 IMPLANT
GOWN STRL REUS W/ TWL XL LVL3 (GOWN DISPOSABLE) ×2 IMPLANT
GOWN STRL REUS W/TWL XL LVL3 (GOWN DISPOSABLE) ×2
K-WIRE TROCAR POINT 2.5X280 (WIRE) ×1
KIT BASIN OR (CUSTOM PROCEDURE TRAY) ×1 IMPLANT
KIT TURNOVER KIT A (KITS) IMPLANT
KWIRE TROCAR POINT 2.5X280 (WIRE) IMPLANT
MANIFOLD NEPTUNE II (INSTRUMENTS) ×1 IMPLANT
NAIL CANN HUM IM 7X255 (Nail) IMPLANT
NDL SAFETY ECLIP 18X1.5 (MISCELLANEOUS) ×1 IMPLANT
NS IRRIG 1000ML POUR BTL (IV SOLUTION) ×1 IMPLANT
PACK SHOULDER (CUSTOM PROCEDURE TRAY) ×1 IMPLANT
PROTECTOR NERVE ULNAR (MISCELLANEOUS) ×1 IMPLANT
ROD REAMING 2.5 (INSTRUMENTS) IMPLANT
SCREW LOCK 4.5X36 TI FT (Screw) IMPLANT
SCREW LOCK 4X24 TI (Screw) IMPLANT
SCREW LOCK THRD TI ML 4.5X34 (Screw) IMPLANT
SCREW TI MULTILOC 4.5X42 (Screw) IMPLANT
SLING ARM FOAM STRAP LRG (SOFTGOODS) IMPLANT
SLING ARM IMMOBILIZER LRG (SOFTGOODS) ×1 IMPLANT
SPIKE FLUID TRANSFER (MISCELLANEOUS) ×1 IMPLANT
STAPLER VISISTAT 35W (STAPLE) ×1 IMPLANT
STRIP CLOSURE SKIN 1/2X4 (GAUZE/BANDAGES/DRESSINGS) ×1 IMPLANT
SUT VIC AB 0 CT1 27 (SUTURE)
SUT VIC AB 0 CT1 27XBRD ANTBC (SUTURE) ×2 IMPLANT
SUT VIC AB 1 CT1 36 (SUTURE) ×1 IMPLANT
SUT VIC AB 2-0 CT1 27 (SUTURE) ×2
SUT VIC AB 2-0 CT1 27XBRD (SUTURE) ×1 IMPLANT
SUT VIC AB 2-0 CT1 TAPERPNT 27 (SUTURE) IMPLANT
SYR 3ML LL SCALE MARK (SYRINGE) ×1 IMPLANT
TOWEL OR 17X26 10 PK STRL BLUE (TOWEL DISPOSABLE) ×2 IMPLANT
WATER STERILE IRR 1000ML POUR (IV SOLUTION) ×1 IMPLANT

## 2023-01-22 NOTE — Discharge Instructions (Addendum)
Orthopedic surgery discharge instructions:  -Maintain postoperative bandage until follow-up appointment.  This is waterproof, and you may begin showering on postoperative day #3.  Do not submerge underwater.  Maintain that bandage until your follow-up appointment in 2 weeks.  -No lifting over 2 pounds with operateive arm.  You may use the arm immediately for activities of daily living such as bathing, washing your face and brushing your teeth, eating, and getting dressed.  Otherwise maintain your sling when you are out of the house and sleeping.  -Apply ice liberally to the shoulder throughout the day.  For mild to moderate pain use Tylenol and Advil as needed around-the-clock.  For breakthrough pain use oxycodone as necessary.  -You will return to see Dr. Rogers in the office in 2 weeks for routine postoperative check with x-rays.  

## 2023-01-22 NOTE — Anesthesia Preprocedure Evaluation (Addendum)
Anesthesia Evaluation  Patient identified by MRN, date of birth, ID band Patient awake    Reviewed: Allergy & Precautions, NPO status , Patient's Chart, lab work & pertinent test results  History of Anesthesia Complications (+) PONV and history of anesthetic complications  Airway Mallampati: II  TM Distance: >3 FB Neck ROM: Full    Dental  (+) Edentulous Upper, Missing,    Pulmonary neg pulmonary ROS   Pulmonary exam normal        Cardiovascular negative cardio ROS Normal cardiovascular exam     Neuro/Psych negative neurological ROS     GI/Hepatic negative GI ROS, Neg liver ROS,,,  Endo/Other  negative endocrine ROS    Renal/GU negative Renal ROS     Musculoskeletal Right humerus pathologic fracture   Abdominal   Peds  Hematology negative hematology ROS (+)   Anesthesia Other Findings Metastatic breast cancer  Reproductive/Obstetrics                             Anesthesia Physical Anesthesia Plan  ASA: 3  Anesthesia Plan: General   Post-op Pain Management: Regional block* and Tylenol PO (pre-op)*   Induction: Intravenous  PONV Risk Score and Plan: 4 or greater and Midazolam, Treatment may vary due to age or medical condition, Ondansetron and Dexamethasone  Airway Management Planned: Oral ETT  Additional Equipment: None  Intra-op Plan:   Post-operative Plan: Extubation in OR  Informed Consent: I have reviewed the patients History and Physical, chart, labs and discussed the procedure including the risks, benefits and alternatives for the proposed anesthesia with the patient or authorized representative who has indicated his/her understanding and acceptance.     Dental advisory given  Plan Discussed with: CRNA  Anesthesia Plan Comments:        Anesthesia Quick Evaluation

## 2023-01-22 NOTE — Anesthesia Procedure Notes (Signed)
Procedure Name: Intubation Date/Time: 01/22/2023 3:52 PM  Performed by: Johnette Abraham, CRNAPre-anesthesia Checklist: Patient identified, Emergency Drugs available, Suction available and Patient being monitored Patient Re-evaluated:Patient Re-evaluated prior to induction Oxygen Delivery Method: Circle System Utilized Preoxygenation: Pre-oxygenation with 100% oxygen Induction Type: IV induction Ventilation: Mask ventilation without difficulty Laryngoscope Size: Mac and 3 Grade View: Grade I Tube type: Oral Tube size: 7.0 mm Number of attempts: 1 Airway Equipment and Method: Stylet and Oral airway Placement Confirmation: ETT inserted through vocal cords under direct vision, positive ETCO2 and breath sounds checked- equal and bilateral Secured at: 22 cm Tube secured with: Tape Dental Injury: Teeth and Oropharynx as per pre-operative assessment

## 2023-01-22 NOTE — Transfer of Care (Signed)
Immediate Anesthesia Transfer of Care Note  Patient: Kaitlin Baker  Procedure(s) Performed: INTRAMEDULLARY (IM) NAIL HUMERUS (Right: Arm Upper)  Patient Location: PACU  Anesthesia Type:General  Level of Consciousness: awake, drowsy, and patient cooperative  Airway & Oxygen Therapy: Patient Spontanous Breathing and Patient connected to face mask oxygen  Post-op Assessment: Report given to RN and Post -op Vital signs reviewed and stable  Post vital signs: Reviewed and stable  Last Vitals:  Vitals Value Taken Time  BP 144/79 01/22/23 1732  Temp    Pulse 66 01/22/23 1734  Resp 14 01/22/23 1734  SpO2 96 % 01/22/23 1734  Vitals shown include unvalidated device data.  Last Pain:  Vitals:   01/22/23 1515  TempSrc:   PainSc: 0-No pain         Complications: No notable events documented.

## 2023-01-22 NOTE — Anesthesia Postprocedure Evaluation (Signed)
Anesthesia Post Note  Patient: Kaitlin Baker  Procedure(s) Performed: INTRAMEDULLARY (IM) NAIL HUMERUS (Right: Arm Upper)     Patient location during evaluation: PACU Anesthesia Type: General Level of consciousness: awake and alert Pain management: pain level controlled Vital Signs Assessment: post-procedure vital signs reviewed and stable Respiratory status: spontaneous breathing, nonlabored ventilation and respiratory function stable Cardiovascular status: blood pressure returned to baseline Postop Assessment: no apparent nausea or vomiting Anesthetic complications: no   No notable events documented.  Last Vitals:  Vitals:   01/22/23 1745 01/22/23 1800  BP: (!) 152/86 (!) 148/85  Pulse: 73 72  Resp: 15 17  Temp:    SpO2: 97% 97%    Last Pain:  Vitals:   01/22/23 1745  TempSrc:   PainSc: Asleep                 Shanda Howells

## 2023-01-22 NOTE — Op Note (Signed)
INDICATIONS: Kaitlin Baker is a very pleasant right-hand-dominant 67 year old female who is here today for prophylactic intramedullary nailing of right humeral shaft impending fracture due to metastatic burden from primary breast cancer.;  The patient did consent to the procedure after discussion of the risks and benefits.   PREOPERATIVE DIAGNOSIS:  1.  Right humerus metastatic lesions.   POSTOPERATIVE DIAGNOSIS: Same.   PROCEDURE: Right humerus intramedullary nail   SURGEON: Maryan Rued, M.D.   ASSIST: Dion Saucier, PA-C   Assistant attestation:   PA Sharon Seller was present for the entire procedure..   ANESTHESIA:  general, interscalene block with Exparel   IV FLUIDS AND URINE: See anesthesia.   ESTIMATED BLOOD LOSS: 50 mL.   IMPLANTS:  Synthes 7 mm x 255 mm humeral nail 3 proximal locking screws 1 distal interlocking screw   DRAINS: None   COMPLICATIONS: None.   DESCRIPTION OF PROCEDURE: The patient was brought to the operating room and placed sloppy supine with a bump under the scapula on the operating table.  The patient had been signed prior to the procedure and this was documented. The patient had the anesthesia placed by the anesthesiologist.  A time-out was performed to confirm that this was the correct patient, site, side and location. The patient did receive antibiotics prior to the incision and was re-dosed during the procedure as needed at indicated intervals.  A tourniquet was not placed.  The patient had the operative extremity prepped and draped in the standard surgical fashion.      We began the procedure by percutaneously establishing our opening reamer starting point.  Just anterior to the [facility] joint we found a position at the apex of the humeral head on AP and lateral views.  We then confirmed this with a starting pin.  A percutaneous incision was made through the anterior deltoid and bluntly dissected down to the level of the muscular portion of the rotator cuff  superiorly.  This was then split in line with muscle fibers using a tonsil.  We then were able to place in all over our starting pin that was previously placed in the center center position of the proximal humeral head and metaphysis.  The awl was taken down to the level of the metaphysis.  This allowed [place] to enter with our guidewire.  A slight bed was placed and a guidewire and we then were able to advance the guidewire down to the distal humerus.  Of note due to the extreme pathologic nature of some metaphyseal lesions it was quite difficult to pass through the tumor burden without perforating the medial lateral cortices as well as posterior cortices.  We did use the finger device to help direct our guidewire down to the distal humerus.  We were able to do this in a closed fashion.    We then reamed up to a size 8 reamer to accommodate a 7 nail.  We measured the nail length to be 255 mm.   Next we placed our nail over the guidewire.  We malleted this into place to the appropriate depth based on AP intraoperative fluoroscopy.  We then placed 3 proximal locking screws through the hand jig that locked nicely into the lateral cortex.  These had good control of the humerus.  We then viewed under live fluoroscopy to confirm that no screws entered the joint.   Lastly, we turned our attention to the distal interlocking screw.  Utilizing perfect circle technique we identified the proximal of the most distal  cluster of screw holes.  Percutaneously we then placed a drill bit across the anterior to posterior cortex.  This was confirmed on AP and lateral fluoroscopic images.  We then measured with a measuring guide.  We then placed our distal locking bolt which measured 26 mm.   We then copiously irrigated wounds with normal saline.  We closed in layers proximal and distal wounds with 2-0 Vicryl and staples for the skin.  The arm was cleaned and dried and standard sterile dressings were applied.  We then placed the arm  in a sling.  She was awakened from general anesthetic in stable condition.  All counts were correct x 2.   POSTOPERATIVE PLAN:  Ms. Trudel will be able to use the right upper extremity immediately for activities of daily living.  She may also begin ranging the elbow, hand, wrist as well as shoulder as tolerated with active and active assist as well as passive range of motion.  She will be in her sling for 2 weeks just for comfort only but may discontinue them when she feels able.  I will see her back in the office in 2 weeks for routine x-rays right humerus 2 views.

## 2023-01-22 NOTE — Brief Op Note (Signed)
01/22/2023  5:23 PM  PATIENT:  Kaitlin Baker  67 y.o. female  PRE-OPERATIVE DIAGNOSIS:  Right humerus pathologic fracture  POST-OPERATIVE DIAGNOSIS:  Right humerus pathologic fracture  PROCEDURE:  Procedure(s) with comments: INTRAMEDULLARY (IM) NAIL HUMERUS (Right) - 90  SURGEON:  Surgeon(s) and Role:    * Aundria Rud, Noah Delaine, MD - Primary  PHYSICIAN ASSISTANT: Dion Saucier, PA-C   ANESTHESIA:   regional and general  EBL:  20 mL   BLOOD ADMINISTERED:none  DRAINS: none   LOCAL MEDICATIONS USED:  MARCAINE     SPECIMEN:  No Specimen  DISPOSITION OF SPECIMEN:  N/A  COUNTS:  YES  TOURNIQUET:  * No tourniquets in log *  DICTATION: .Note written in EPIC  PLAN OF CARE: Discharge to home after PACU  PATIENT DISPOSITION:  PACU - hemodynamically stable.   Delay start of Pharmacological VTE agent (>24hrs) due to surgical blood loss or risk of bleeding: not applicable

## 2023-01-22 NOTE — Anesthesia Procedure Notes (Signed)
Anesthesia Regional Block: Interscalene brachial plexus block   Pre-Anesthetic Checklist: , timeout performed,  Correct Patient, Correct Site, Correct Laterality,  Correct Procedure, Correct Position, site marked,  Risks and benefits discussed,  Pre-op evaluation,  At surgeon's request and post-op pain management  Laterality: Right  Prep: Maximum Sterile Barrier Precautions used, chloraprep       Needles:  Injection technique: Single-shot  Needle Type: Echogenic Stimulator Needle     Needle Length: 4cm  Needle Gauge: 22     Additional Needles:   Procedures:,,,, ultrasound used (permanent image in chart),,    Narrative:  Start time: 01/22/2023 2:52 PM End time: 01/22/2023 2:55 PM Injection made incrementally with aspirations every 5 mL.  Performed by: Personally  Anesthesiologist: Kaylyn Layer, MD  Additional Notes: Risks, benefits, and alternative discussed. Patient gave consent for procedure. Patient prepped and draped in sterile fashion. Sedation administered, patient remains easily responsive to voice. Relevant anatomy identified with ultrasound guidance. Local anesthetic given in 5cc increments with no signs or symptoms of intravascular injection. No pain or paraesthesias with injection. Patient monitored throughout procedure with signs of LAST or immediate complications. Tolerated well. Ultrasound image placed in chart.  [name], MD

## 2023-01-22 NOTE — H&P (Signed)
ORTHOPAEDIC H&P  REQUESTING PHYSICIAN: Yolonda Kida, MD  PCP:  Default, Provider, MD  Chief Complaint: Right humerus metastatic bone lesion  HPI: Kaitlin Baker is a 67 y.o. female who complains of right humeral pain.  She has an unfortunate history of metastatic breast carcinoma.  She has had successful treatment over the years.  First diagnosed back in 2021.  Unfortunate, developed some resistance to her targeted therapeutic treatments.  She has had increasing pain in the right arm and x-rays did demonstrate 3 lytic lesions in the proximal and midshaft region of the right humerus.  She is here today for prophylactic intramedullary nailing.  No new complaints at this time.  Past Medical History:  Diagnosis Date   Arthritis    Breast cancer (HCC)    Family history of breast cancer    High cholesterol    PONV (postoperative nausea and vomiting)    Past Surgical History:  Procedure Laterality Date   CHOLECYSTECTOMY     neck surgery     plate placed   Social History   Socioeconomic History   Marital status: Unknown    Spouse name: Not on file   Number of children: 2   Years of education: Not on file   Highest education level: Not on file  Occupational History   Occupation: retired  Tobacco Use   Smoking status: Never   Smokeless tobacco: Never  Vaping Use   Vaping Use: Never used  Substance and Sexual Activity   Alcohol use: Never   Drug use: Never   Sexual activity: Not Currently  Other Topics Concern   Not on file  Social History Narrative   Not on file   Social Determinants of Health   Financial Resource Strain: Low Risk  (07/17/2020)   Overall Financial Resource Strain (CARDIA)    Difficulty of Paying Living Expenses: Not hard at all  Food Insecurity: No Food Insecurity (07/17/2020)   Hunger Vital Sign    Worried About Running Out of Food in the Last Year: Never true    Ran Out of Food in the Last Year: Never true  Transportation Needs: No  Transportation Needs (07/17/2020)   PRAPARE - Administrator, Civil Service (Medical): No    Lack of Transportation (Non-Medical): No  Physical Activity: Inactive (07/17/2020)   Exercise Vital Sign    Days of Exercise per Week: 0 days    Minutes of Exercise per Session: 0 min  Stress: No Stress Concern Present (07/17/2020)   [employer] of Occupational Health - Occupational Stress Questionnaire    Feeling of Stress : Not at all  Social Connections: Moderately Integrated (07/17/2020)   Social Connection and Isolation Panel [NHANES]    Frequency of Communication with Friends and Family: Three times a week    Frequency of Social Gatherings with Friends and Family: Three times a week    Attends Religious Services: 1 to 4 times per year    Active Member of Clubs or Organizations: No    Attends Banker Meetings: Never    Marital Status: Married   Family History  Adopted: Yes  Problem Relation Age of Onset   Breast cancer Mother        dx 31s   Brain cancer Father        vs brain tumor, d. 60   Breast cancer Sister        dx 8   Breast cancer Sister  dx 40s   Liver cancer Brother    Alcohol abuse Brother    Asthma Son    Allergies  Allergen Reactions   Codeine Nausea And Vomiting   Prior to Admission medications   Medication Sig Start Date End Date Taking? Authorizing Provider  anastrozole (ARIMIDEX) 1 MG tablet Take 1 tablet (1 mg total) by mouth daily. 02/20/22  Yes [name], MD  oxyCODONE-acetaminophen (PERCOCET/ROXICET) 5-325 MG tablet Take 1 tablet by mouth every 12 (twelve) hours as needed for moderate pain. 12/22/22  Yes [name], MD  palbociclib (IBRANCE) 100 MG tablet TAKE 1 TABLET (100 MG TOTAL) BY MOUTH DAILY. TAKE FOR 21 DAYS ON, 7 DAYS OFF, REPEAT EVERY 28 DAYS. 06/08/22 06/08/23 Yes [name], MD  ergocalciferol (VITAMIN D2) 1.25 MG (50000 UT) capsule Take 1 capsule (50,000 Units total) by mouth once a  week. Patient not taking: Reported on 01/20/2023 10/07/22   [name], MD   No results found.  Positive ROS: All other systems have been reviewed and were otherwise negative with the exception of those mentioned in the HPI and as above.  Physical Exam: General: Alert, no acute distress Cardiovascular: No pedal edema Respiratory: No cyanosis, no use of accessory musculature GI: No organomegaly, abdomen is soft and non-tender Skin: No lesions in the area of chief complaint Neurologic: Sensation intact distally Psychiatric: Patient is competent for consent with normal mood and affect Lymphatic: No axillary or cervical lymphadenopathy  MUSCULOSKELETAL: Right upper extremity is warm and well-perfused with no open wounds or lesions.  Assessment: Right humerus metastatic bone lytic lesions  Plan: Plan be to proceed today with intramedullary nailing of the right humerus.  We again discussed the risk and benefits of the procedure which include but not limited to bleeding, infection, damage to surrounding nerves and vessels, damage to surrounding rotator cuff musculature, stiffness and pain that may persist, failure of implants, need for revision surgery as well as risk of anesthesia.  He has provided informed consent.  Plan be to discharge home postoperatively with sling and then immediate weightbearing as tolerated as well as use of the arm.    Yolonda Kida, MD Cell [phone]    01/22/2023 12:08 PM

## 2023-01-25 ENCOUNTER — Encounter (HOSPITAL_COMMUNITY): Payer: Self-pay | Admitting: Orthopedic Surgery

## 2023-01-28 ENCOUNTER — Inpatient Hospital Stay: Payer: Medicare Other | Admitting: Hematology

## 2023-02-04 ENCOUNTER — Other Ambulatory Visit: Payer: Self-pay | Admitting: *Deleted

## 2023-02-04 DIAGNOSIS — G893 Neoplasm related pain (acute) (chronic): Secondary | ICD-10-CM

## 2023-02-04 DIAGNOSIS — C7951 Secondary malignant neoplasm of bone: Secondary | ICD-10-CM

## 2023-02-04 MED ORDER — OXYCODONE-ACETAMINOPHEN 5-325 MG PO TABS
1.0000 | ORAL_TABLET | Freq: Two times a day (BID) | ORAL | 0 refills | Status: DC | PRN
Start: 2023-02-04 — End: 2023-03-02

## 2023-02-15 ENCOUNTER — Inpatient Hospital Stay: Payer: Medicare Other | Attending: Hematology | Admitting: Hematology

## 2023-02-15 ENCOUNTER — Other Ambulatory Visit (HOSPITAL_COMMUNITY): Payer: Self-pay

## 2023-02-15 ENCOUNTER — Other Ambulatory Visit: Payer: Self-pay

## 2023-02-15 VITALS — BP 124/76 | HR 86 | Temp 97.4°F | Resp 17 | Ht 64.0 in | Wt 193.1 lb

## 2023-02-15 DIAGNOSIS — Z79899 Other long term (current) drug therapy: Secondary | ICD-10-CM | POA: Diagnosis not present

## 2023-02-15 DIAGNOSIS — Z170 Estrogen receptor positive status [ER+]: Secondary | ICD-10-CM | POA: Insufficient documentation

## 2023-02-15 DIAGNOSIS — Z803 Family history of malignant neoplasm of breast: Secondary | ICD-10-CM | POA: Diagnosis not present

## 2023-02-15 DIAGNOSIS — C7951 Secondary malignant neoplasm of bone: Secondary | ICD-10-CM

## 2023-02-15 DIAGNOSIS — C50912 Malignant neoplasm of unspecified site of left female breast: Secondary | ICD-10-CM | POA: Diagnosis not present

## 2023-02-15 DIAGNOSIS — Z79811 Long term (current) use of aromatase inhibitors: Secondary | ICD-10-CM | POA: Diagnosis not present

## 2023-02-15 DIAGNOSIS — R590 Localized enlarged lymph nodes: Secondary | ICD-10-CM | POA: Insufficient documentation

## 2023-02-15 DIAGNOSIS — M549 Dorsalgia, unspecified: Secondary | ICD-10-CM | POA: Diagnosis not present

## 2023-02-15 DIAGNOSIS — E041 Nontoxic single thyroid nodule: Secondary | ICD-10-CM | POA: Insufficient documentation

## 2023-02-15 DIAGNOSIS — E559 Vitamin D deficiency, unspecified: Secondary | ICD-10-CM | POA: Diagnosis not present

## 2023-02-15 MED ORDER — ELACESTRANT HYDROCHLORIDE 345 MG PO TABS
345.0000 mg | ORAL_TABLET | Freq: Every day | ORAL | 3 refills | Status: DC
Start: 2023-02-15 — End: 2023-02-16
  Filled 2023-02-15: qty 30, 30d supply, fill #0

## 2023-02-15 MED ORDER — PROCHLORPERAZINE MALEATE 10 MG PO TABS: 10.0000 mg | ORAL_TABLET | Freq: Four times a day (QID) | ORAL | 3 refills | Status: DC | PRN

## 2023-02-15 NOTE — Patient Instructions (Addendum)
[facility]  Discharge Instructions  You were seen and examined today by Dr. Ellin Saba.  Dr. Ellin Saba discussed your most recent lab work which revealed that the Guardant shows that the Ibrance and Anastrozole is no longer helping.   Dr. Ellin Saba sent in Orserdu to [facility] start taking it when you receive it.   Come have your labs done in the morning when you are fasting.  Follow-up as scheduled in 4 weeks.    Thank you for choosing [facility] to provide your oncology and hematology care.   To afford each patient quality time with our provider, please arrive at least 15 minutes before your scheduled appointment time. You may need to reschedule your appointment if you arrive late (10 or more minutes). Arriving late affects you and other patients whose appointments are after yours.  Also, if you miss three or more appointments without notifying the office, you may be dismissed from the clinic at the provider's discretion.    Again, thank you for choosing [facility].  Our hope is that these requests will decrease the amount of time that you wait before being seen by our physicians.   If you have a lab appointment with the Cancer Center - please note that after April 8th, all labs will be drawn in the cancer center.  You do not have to check in or register with the main entrance as you have in the past but will complete your check-in at the cancer center.            _____________________________________________________________  Should you have questions after your visit to [facility], please contact our office at [phone] and follow the prompts.  Our office hours are 8:00 a.m. to 4:30 p.m. Monday - Thursday and 8:00 a.m. to 2:30 p.m. Friday.  Please note that voicemails left after 4:00 p.m. may not be returned until the following business day.  We are closed weekends and all major holidays.  You do  have access to a nurse 24-7, just call the main number to the clinic [phone] and do not press any options, hold on the line and a nurse will answer the phone.    For prescription refill requests, have your pharmacy contact our office and allow 72 hours.    Masks are no longer required in the cancer centers. If you would like for your care team to wear a mask while they are taking care of you, please let them know. You may have one support person who is at least 69 years old accompany you for your appointments.

## 2023-02-15 NOTE — Progress Notes (Signed)
[facility] [address] [place] [address]    Clinic Day:  02/15/2023  Referring physician: No ref. provider found  Patient Care Team: Default, Provider, MD as PCP - General [name], MD as Medical Oncologist (Medical Oncology)   ASSESSMENT & PLAN:   Assessment: 1.  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  She did not seek medical attention.  She did not have mammograms for the past 30 years. -Reported 26 pound weight loss which was intentional in the last 6 months. -Developed fracture of the proximal left humeral diaphysis when she picked up her cat. -CT humerus left without contrast on 05/23/2020 shows minimally displaced oblique fracture of the proximal left humeral diaphysis with no definitive CT evidence of underlying lesion.  Nonspecific left axillary adenopathy. -Physical exam today reveals left breast mass occupying the majority of the outer quadrant with skin thickening.  Could not palpate the axillary lymph node as she was unable to lift her arm. -PET scan on 06/24/2020 showed irregular hypermetabolic 8.1 x 4.0 left breast mass compatible with malignancy with left axillary nodal metastasis.  Extensive multifocal mixed lytic and sclerotic metastatic disease throughout the axial and proximal appendicular skeleton. -Ultrasound-guided left breast biopsy on 07/11/2020 shows invasive mammary carcinoma, grade 2.  ER/PR 90% positive.  Ki-67 60%.  HER-2 2+ by IHC.  HER-2 negative by FISH. -Anastrozole and palbociclib from 07/19/2020 through 02/15/2023 with progression - Germline mutation testing was negative. - Bone scan: Single new focus of uptake in the mid thoracic spine at T8. - PET scan (01/21/2023): Diffuse lytic/sclerotic bone mets-right glenoid, left sixth rib, T9 vertebral body, L1 vertebral body, left established lesion, left proximal femur lesion and right humerus.   No visceral lesion. - Guardant360 (01/18/2023): ESR1 Y537N and ESR1 Y537S.  BRCA2 12117fs.  MSI high not detected. -Elacestrant 345 mg daily   2.  Social/family history: -Lives at home with husband.  Never smoker.  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    Plan: 1.  Metastatic ER positive left breast cancer to the bones: - She had right humerus nailing done. - We have reviewed the PET scan images which showed diffuse lytic sclerotic bone metastatic disease, some of the lesions are new when compared to PET scan from 06/24/2020. - We reviewed Guardant360 results which showed ESR 1 mutation. - Recommend discontinuing anastrozole and palbociclib. - Recommend starting on Elacestrant 345 mg daily with food symptom every day. - We discussed side effects including hyponatremia, hot flashes, increased cholesterol levels, GI side effects like nausea/vomiting, elevated liver enzymes, musculoskeletal pains and fatigue. - Will send in Compazine to take as needed. - We will obtain baseline fasting lipid panel, hemoglobin A1c, hepatitis B surface antigen, surface antibody and core antibody. - RTC 4 weeks for follow-up.   2.  Bone metastasis: - Denosumab was discussed to decrease SRE.  Because of poor dentition it was not started.   3.  Chronic back pain: - Continue Percocet 1 tablet in the morning and occasionally second tablet in the evening.   4.  Low vitamin D levels: - Continue vitamin D weekly.  Vitamin D level was normal.   5.  Systemic thyroid nodule: - CT scan showed 3.1 cm Histamic thyroid nodule.  PET scan did not show hypermetabolism.    Orders Placed This Encounter  Procedures  Lipid panel    Standing Status:   Future    Standing Expiration Date:   02/15/2024   Hemoglobin A1c    Standing Status:   Future    Standing Expiration Date:   02/15/2024   Hepatitis B core antibody, total    Standing Status:   Future     Standing Expiration Date:   02/15/2024   Hepatitis B surface antigen    Standing Status:   Future    Standing Expiration Date:   02/15/2024   Hepatitis B surface antibody    Standing Status:   Future    Standing Expiration Date:   02/15/2024   CBC with Differential/Platelet    Standing Status:   Future    Standing Expiration Date:   02/15/2024    Order Specific Question:   Release to patient    Answer:   Immediate   Comprehensive metabolic panel    Standing Status:   Future    Standing Expiration Date:   02/15/2024    Order Specific Question:   Release to patient    Answer:   Immediate   Magnesium    Standing Status:   Future    Standing Expiration Date:   02/15/2024    Order Specific Question:   Release to patient    Answer:   Immediate      I,Katie Daubenspeck,acting as a scribe for [name], MD.,have documented all relevant documentation on the behalf of [name], MD,as directed by  [name], MD while in the presence of [name], MD.   I, [name] MD, have reviewed the above documentation for accuracy and completeness, and I agree with the above.   [name], MD   6/3/20245:01 PM  CHIEF COMPLAINT:   Diagnosis: metastatic left breast cancer    Cancer Staging  No matching staging information was found for the patient.   Prior Therapy: none  Current Therapy:  Anastrozole, Ibrance    HISTORY OF PRESENT ILLNESS:   Oncology History   No history exists.     INTERVAL HISTORY:   Kaitlin Baker is a 67 y.o. female presenting to clinic today for follow up of metastatic left breast cancer. She was last seen by me on 01/12/23.  Since her last visit, she underwent restaging PET scan on 01/21/23 showing: stable post-treatment changes without findings for recurrent breast cancer; no findings for metastatic disease; diffuse lytic and sclerotic metastatic bone disease; focus of moderate hypermetabolism involving ascending  colon.  She also underwent right humeral intramedullary nailing on 01/22/23 under Dr. Aundria Rud.  Today, she states that she is doing well overall. Her appetite level is at 100%. Her energy level is at 60%.  PAST MEDICAL HISTORY:   Past Medical History: Past Medical History:  Diagnosis Date   Arthritis    Breast cancer (HCC)    Family history of breast cancer    High cholesterol    PONV (postoperative nausea and vomiting)     Surgical History: Past Surgical History:  Procedure Laterality Date   CHOLECYSTECTOMY     HUMERUS IM NAIL Right 01/22/2023   Procedure: INTRAMEDULLARY (IM) NAIL HUMERUS;  Surgeon: Yolonda Kida, MD;  Location: [facility];  Service: Orthopedics;  Laterality: Right;  90   neck surgery     plate placed    Social History: Social History   Socioeconomic History   Marital status: Unknown    Spouse name: Not on file   Number of children: 2   Years of  education: Not on file   Highest education level: Not on file  Occupational History   Occupation: retired  Tobacco Use   Smoking status: Never   Smokeless tobacco: Never  Vaping Use   Vaping Use: Never used  Substance and Sexual Activity   Alcohol use: Never   Drug use: Never   Sexual activity: Not Currently  Other Topics Concern   Not on file  Social History Narrative   Not on file   Social Determinants of Health   Financial Resource Strain: Low Risk  (07/17/2020)   Overall Financial Resource Strain (CARDIA)    Difficulty of Paying Living Expenses: Not hard at all  Food Insecurity: No Food Insecurity (07/17/2020)   Hunger Vital Sign    Worried About Running Out of Food in the Last Year: Never true    Ran Out of Food in the Last Year: Never true  Transportation Needs: No Transportation Needs (07/17/2020)   PRAPARE - Administrator, Civil Service (Medical): No    Lack of Transportation (Non-Medical): No  Physical Activity: Inactive (07/17/2020)   Exercise Vital Sign    Days of  Exercise per Week: 0 days    Minutes of Exercise per Session: 0 min  Stress: No Stress Concern Present (07/17/2020)   [employer] of Occupational Health - Occupational Stress Questionnaire    Feeling of Stress : Not at all  Social Connections: Moderately Integrated (07/17/2020)   Social Connection and Isolation Panel [NHANES]    Frequency of Communication with Friends and Family: Three times a week    Frequency of Social Gatherings with Friends and Family: Three times a week    Attends Religious Services: 1 to 4 times per year    Active Member of Clubs or Organizations: No    Attends Banker Meetings: Never    Marital Status: Married  Catering manager Violence: Not At Risk (07/17/2020)   Humiliation, Afraid, Rape, and Kick questionnaire    Fear of Current or Ex-Partner: No    Emotionally Abused: No    Physically Abused: No    Sexually Abused: No    Family History: Family History  Adopted: Yes  Problem Relation Age of Onset   Breast cancer Mother        dx 27s   Brain cancer Father        vs brain tumor, d. 71   Breast cancer Sister        dx 42   Breast cancer Sister        dx 67s   Liver cancer Brother    Alcohol abuse Brother    Asthma Son     Current Medications:  Current Outpatient Medications:    anastrozole (ARIMIDEX) 1 MG tablet, Take 1 tablet (1 mg total) by mouth daily., Disp: 90 tablet, Rfl: 3   elacestrant hydrochloride (ORSERDU) 345 MG tablet, Take 1 tablet (345 mg total) by mouth daily. Take with food., Disp: 30 tablet, Rfl: 3   ergocalciferol (VITAMIN D2) 1.25 MG (50000 UT) capsule, Take 1 capsule (50,000 Units total) by mouth once a week., Disp: 26 capsule, Rfl: 1   ondansetron (ZOFRAN) 4 MG tablet, Take 1 tablet (4 mg total) by mouth every 8 (eight) hours as needed for vomiting or nausea., Disp: 20 tablet, Rfl: 0   oxyCODONE-acetaminophen (PERCOCET/ROXICET) 5-325 MG tablet, Take 1 tablet by mouth every 12 (twelve) hours as needed for  moderate pain., Disp: 60 tablet, Rfl: 0   palbociclib (IBRANCE) 100  MG tablet, TAKE 1 TABLET (100 MG TOTAL) BY MOUTH DAILY. TAKE FOR 21 DAYS ON, 7 DAYS OFF, REPEAT EVERY 28 DAYS., Disp: 42 tablet, Rfl: 3   Allergies: Allergies  Allergen Reactions   Codeine Nausea And Vomiting    REVIEW OF SYSTEMS:   Review of Systems  Constitutional:  Negative for chills, fatigue and fever.  HENT:   Negative for lump/mass, mouth sores, nosebleeds, sore throat and trouble swallowing.   Eyes:  Negative for eye problems.  Respiratory:  Negative for cough and shortness of breath.   Cardiovascular:  Negative for chest pain, leg swelling and palpitations.  Gastrointestinal:  Negative for abdominal pain, constipation, diarrhea, nausea and vomiting.  Genitourinary:  Negative for bladder incontinence, difficulty urinating, dysuria, frequency, hematuria and nocturia.   Musculoskeletal:  Positive for arthralgias. Negative for back pain, flank pain, myalgias and neck pain.  Skin:  Negative for itching and rash.  Neurological:  Negative for dizziness, headaches and numbness.  Hematological:  Does not bruise/bleed easily.  Psychiatric/Behavioral:  Negative for depression, sleep disturbance and suicidal ideas. The patient is not nervous/anxious.   All other systems reviewed and are negative.    VITALS:   Blood pressure 124/76, pulse 86, temperature (!) 97.4 F (36.3 C), temperature source Tympanic, resp. rate 17, height 5\' 4"  (1.626 m), weight 193 lb 1.6 oz (87.6 kg), SpO2 97 %.  Wt Readings from Last 3 Encounters:  02/15/23 193 lb 1.6 oz (87.6 kg)  01/22/23 190 lb (86.2 kg)  01/12/23 193 lb 12.6 oz (87.9 kg)    Body mass index is 33.15 kg/m.  Performance status (ECOG): 1 - Symptomatic but completely ambulatory  PHYSICAL EXAM:   Physical Exam Vitals and nursing note reviewed. Exam conducted with a chaperone present.  Constitutional:      Appearance: Normal appearance.  Cardiovascular:     Rate and  Rhythm: Normal rate and regular rhythm.     Pulses: Normal pulses.     Heart sounds: Normal heart sounds.  Pulmonary:     Effort: Pulmonary effort is normal.     Breath sounds: Normal breath sounds.  Abdominal:     Palpations: Abdomen is soft. There is no hepatomegaly, splenomegaly or mass.     Tenderness: There is no abdominal tenderness.  Musculoskeletal:     Right lower leg: No edema.     Left lower leg: No edema.  Lymphadenopathy:     Cervical: No cervical adenopathy.     Right cervical: No superficial, deep or posterior cervical adenopathy.    Left cervical: No superficial, deep or posterior cervical adenopathy.     Upper Body:     Right upper body: No supraclavicular or axillary adenopathy.     Left upper body: No supraclavicular or axillary adenopathy.  Neurological:     General: No focal deficit present.     Mental Status: She is alert and oriented to person, place, and time.  Psychiatric:        Mood and Affect: Mood normal.        Behavior: Behavior normal.     LABS:      Latest Ref Rng & Units 01/05/2023   10:18 AM 09/18/2022    8:30 AM 06/03/2022    9:34 AM  CBC  WBC 4.0 - 10.5 K/uL 3.4  3.8  3.4   Hemoglobin 12.0 - 15.0 g/dL 16.1  09.6  04.5   Hematocrit 36.0 - 46.0 % 38.0  38.9  38.6   Platelets 150 -  400 K/uL 237  307  290       Latest Ref Rng & Units 01/05/2023   10:18 AM 09/18/2022    8:30 AM 06/03/2022    9:34 AM  CMP  Glucose 70 - 99 mg/dL 99  94  95   BUN 8 - 23 mg/dL 16  16  22    Creatinine 0.44 - 1.00 mg/dL 1.61  0.96  0.45   Sodium 135 - 145 mmol/L 137  139  140   Potassium 3.5 - 5.1 mmol/L 3.8  4.1  3.7   Chloride 98 - 111 mmol/L 103  102  106   CO2 22 - 32 mmol/L 25  28  26    Calcium 8.9 - 10.3 mg/dL 9.3  9.0  9.0   Total Protein 6.5 - 8.1 g/dL 7.3  7.4  7.4   Total Bilirubin 0.3 - 1.2 mg/dL 0.6  0.7  0.6   Alkaline Phos 38 - 126 U/L 88  80  68   AST 15 - 41 U/L 21  20  16    ALT 0 - 44 U/L 21  15  15       No results found for: "CEA1",  "CEA" / No results found for: "CEA1", "CEA" No results found for: "PSA1" No results found for: "WUJ811" No results found for: "CAN125"  No results found for: "TOTALPROTELP", "ALBUMINELP", "A1GS", "A2GS", "BETS", "BETA2SER", "GAMS", "MSPIKE", "SPEI" No results found for: "TIBC", "FERRITIN", "IRONPCTSAT" No results found for: "LDH"   STUDIES:   NM PET Image Restag (PS) Skull Base To Thigh  Result Date: 01/25/2023 CLINICAL DATA:  Subsequent treatment strategy for breast cancer. EXAM: NUCLEAR MEDICINE PET SKULL BASE TO THIGH TECHNIQUE: 9.27 mCi F-18 FDG was injected intravenously. Full-ring PET imaging was performed from the skull base to thigh after the radiotracer. CT data was obtained and used for attenuation correction and anatomic localization. Fasting blood glucose: 101 mg/dl COMPARISON:  Whole body bone scan and CT scans 09/18/2022. FINDINGS: Mediastinal blood pool activity: SUV max 2.69 Liver activity: SUV max NA NECK: No hypermetabolic lymph nodes in the neck. Incidental CT findings: Stable cystic isthmic thyroid lesion without hypermetabolism. This has been evaluated on previous imaging. (ref: J Am Coll Radiol. 2015 Feb;12(2): 143-50). CHEST: No hypermetabolic mediastinal or hilar nodes. No suspicious pulmonary nodules on the CT scan. Stable post treatment changes involving the breast with skin thickening and subareolar soft tissue density. SUV max is 2.41. No findings suspicious recurrent breast cancer. Stable small left axillary lymph nodes not demonstrating any hypermetabolism. Incidental CT findings: None ABDOMEN/PELVIS: No abnormal hypermetabolic activity within the liver, pancreas, adrenal glands, or spleen. No hypermetabolic lymph nodes in the abdomen or pelvis. Focus of moderate hypermetabolism involving the ascending colon with possible associated soft tissue thickening. SUV max is 9.62. Recommend correlation with colon cancer screening history. Colonoscopy may be indicated. Incidental CT  findings: Status post cholecystectomy. No biliary dilatation. Advanced sigmoid colon diverticulosis. SKELETON: Diffuse lytic and sclerotic metastatic bone disease which appears similar to recent CT studies. Areas of moderate hypermetabolism are noted suggesting active metastatic lesions including the right glenoid (SUV max 4.53), left sixth rib (SUV max 5.44), T9 vertebral body (SUV max 5.28), L1 vertebral body (SUV max 6.55), left acetabular lesion (SUV max 5.67), left proximal femur lesion (SUV max 5.52). Incidental CT findings: None. IMPRESSION: 1. Stable post treatment changes involving the left breast without findings for recurrent breast cancer. 2. No findings for metastatic disease involving the chest, abdomen or pelvis. 3.  Diffuse lytic and sclerotic metastatic bone disease with areas of hypermetabolism suggesting active disease. 4. Focus of moderate hypermetabolism involving the ascending colon. Recommend correlation with colon cancer screening history. Colonoscopy may be indicated. Electronically Signed   By: Rudie Meyer M.D.   On: 01/25/2023 10:04   DG Humerus Right  Result Date: 01/22/2023 CLINICAL DATA:  Elective surgery. EXAM: RIGHT HUMERUS - 2+ VIEW COMPARISON:  None Available. FINDINGS: Five fluoroscopic spot views of the right humerus obtained in the operating room. Humeral intramedullary nail with proximal and distal locking screw fixation. Fluoroscopy time 1 minutes 30 seconds. Dose 11.3 mGy. IMPRESSION: Procedural fluoroscopy for right humeral intramedullary nail. Electronically Signed   By: Narda Rutherford M.D.   On: 01/22/2023 18:13   DG C-Arm 1-60 Min-No Report  Result Date: 01/22/2023 Fluoroscopy was utilized by the requesting physician.  No radiographic interpretation.

## 2023-02-16 ENCOUNTER — Telehealth: Payer: Self-pay

## 2023-02-16 ENCOUNTER — Telehealth: Payer: Self-pay | Admitting: *Deleted

## 2023-02-16 ENCOUNTER — Inpatient Hospital Stay: Payer: Medicare Other

## 2023-02-16 ENCOUNTER — Other Ambulatory Visit (HOSPITAL_COMMUNITY): Payer: Self-pay

## 2023-02-16 ENCOUNTER — Telehealth: Payer: Self-pay | Admitting: Pharmacist

## 2023-02-16 DIAGNOSIS — C7951 Secondary malignant neoplasm of bone: Secondary | ICD-10-CM

## 2023-02-16 DIAGNOSIS — C50912 Malignant neoplasm of unspecified site of left female breast: Secondary | ICD-10-CM

## 2023-02-16 LAB — LIPID PANEL
Cholesterol: 250 mg/dL — ABNORMAL HIGH (ref 0–200)
HDL: 41 mg/dL (ref >40)
LDL Cholesterol: 173 mg/dL — ABNORMAL HIGH (ref 0–99)
Total CHOL/HDL Ratio: 6.1 RATIO
Triglycerides: 179 mg/dL — ABNORMAL HIGH (ref <150)
VLDL: 36 mg/dL (ref 0–40)

## 2023-02-16 LAB — COMPREHENSIVE METABOLIC PANEL
ALT: 16 U/L (ref 0–44)
AST: 19 U/L (ref 15–41)
Albumin: 3.8 g/dL (ref 3.5–5.0)
Alkaline Phosphatase: 99 U/L (ref 38–126)
Anion gap: 9 (ref 5–15)
BUN: 17 mg/dL (ref 8–23)
CO2: 26 mmol/L (ref 22–32)
Calcium: 8.9 mg/dL (ref 8.9–10.3)
Chloride: 104 mmol/L (ref 98–111)
Creatinine, Ser: 0.93 mg/dL (ref 0.44–1.00)
GFR, Estimated: >60 mL/min (ref >60)
Glucose, Bld: 107 mg/dL — ABNORMAL HIGH (ref 70–99)
Potassium: 3.8 mmol/L (ref 3.5–5.1)
Sodium: 139 mmol/L (ref 135–145)
Total Bilirubin: 0.3 mg/dL (ref 0.3–1.2)
Total Protein: 7.1 g/dL (ref 6.5–8.1)

## 2023-02-16 LAB — CBC WITH DIFFERENTIAL/PLATELET
Abs Immature Granulocytes: 0 10*3/uL (ref 0.00–0.07)
Basophils Absolute: 0 10*3/uL (ref 0.0–0.1)
Basophils Relative: 1 %
Eosinophils Absolute: 0.1 10*3/uL (ref 0.0–0.5)
Eosinophils Relative: 4 %
HCT: 35.1 % — ABNORMAL LOW (ref 36.0–46.0)
Hemoglobin: 12 g/dL (ref 12.0–15.0)
Lymphocytes Relative: 44 %
Lymphs Abs: 1 10*3/uL (ref 0.7–4.0)
MCH: 34.9 pg — ABNORMAL HIGH (ref 26.0–34.0)
MCHC: 34.2 g/dL (ref 30.0–36.0)
MCV: 102 fL — ABNORMAL HIGH (ref 80.0–100.0)
Monocytes Absolute: 0.1 10*3/uL (ref 0.1–1.0)
Monocytes Relative: 4 %
Neutro Abs: 1 10*3/uL — ABNORMAL LOW (ref 1.7–7.7)
Neutrophils Relative %: 47 %
Platelets: 228 10*3/uL (ref 150–400)
RBC: 3.44 MIL/uL — ABNORMAL LOW (ref 3.87–5.11)
RDW: 13.7 % (ref 11.5–15.5)
WBC: 2.2 10*3/uL — ABNORMAL LOW (ref 4.0–10.5)
nRBC: 0 % (ref 0.0–0.2)

## 2023-02-16 LAB — MAGNESIUM: Magnesium: 2.1 mg/dL (ref 1.7–2.4)

## 2023-02-16 LAB — HEPATITIS B SURFACE ANTIBODY,QUALITATIVE: Hep B S Ab: NONREACTIVE

## 2023-02-16 LAB — HEPATITIS B CORE ANTIBODY, TOTAL: Hep B Core Total Ab: NONREACTIVE

## 2023-02-16 LAB — HEPATITIS B SURFACE ANTIGEN: Hepatitis B Surface Ag: NONREACTIVE

## 2023-02-16 MED ORDER — ELACESTRANT HYDROCHLORIDE 345 MG PO TABS
345.0000 mg | ORAL_TABLET | Freq: Every day | ORAL | 3 refills | Status: DC
Start: 2023-02-16 — End: 2023-06-08

## 2023-02-16 NOTE — Telephone Encounter (Signed)
[facility] does not have have access to Orserdu. Patient rx sent to [facility]. Attempted to call patient and let her know, LVM.

## 2023-02-16 NOTE — Telephone Encounter (Signed)
[name] Encounter   Began application for assistance for Orserdu through [facility] Patient Assistance Program.   Application will be submitted upon completion of necessary supporting documentation.   [employer] phone number [phone].   I will continue to check the status until final determination.    Ardeen Fillers, CPhT Oncology Pharmacy Patient Advocate  [facility]  [phone] (phone) [phone] (fax) 02/16/2023 9:03 AM

## 2023-02-16 NOTE — Telephone Encounter (Addendum)
[name] Encounter  Prior Authorization for Orserdu has been approved.    PA# [id number]  Effective dates: 02/16/23 through 09/14/23  Patients co-pay is $3,249.19.   Obtained grant to bring co-pay to $0.00.  Orserdu is limited distribution. Script has been sent to [facility].    Ardeen Fillers, CPhT Oncology Pharmacy Patient Advocate  [facility]  [phone] (phone) [phone] (fax) 02/16/2023 8:32 AM

## 2023-02-16 NOTE — Telephone Encounter (Signed)
[name] Encounter  New authorization   Received notification that prior authorization for Orserdu is required.   PA submitted on 02/16/23  Key [id number]  Status is pending     Ardeen Fillers, CPhT Oncology Pharmacy Patient Advocate  [facility]  [phone] (phone) [phone] (fax) 02/16/2023 8:20 AM

## 2023-02-16 NOTE — Telephone Encounter (Signed)
Called patient to make her aware that Orserdu was sent to [facility] and compazine for nausea to [facility] if needed.  Advised that she will receive a phone call from the pharmacist at [facility] to educate on new treatment.  She will call if she has any issues after beginning treatment.

## 2023-02-16 NOTE — Telephone Encounter (Signed)
Oral Oncology Pharmacist Encounter  Received new prescription for Orserdu (elacestrant) for the treatment of metastatic breast cancer, ER/PR positive and HER2 negative, ESR1 positive (Guardant 360), planned duration until disease progression or unacceptable drug toxicity.  CMP from 02/16/23 assessed, no relevant lab abnormalities. Prescription dose and frequency assessed.   Current medication list in Epic reviewed, no DDIs with elacestrant identified:  Evaluated chart and no patient barriers to medication adherence identified.   Prescription has been e-scribed to the [facility] for benefits analysis and approval.  Oral Oncology Clinic will continue to follow for insurance authorization, copayment issues, initial counseling and start date.   Remi Haggard, PharmD, BCPS, BCOP, CPP Hematology/Oncology Clinical Pharmacist Practitioner [place]/DB/AP Oral Chemotherapy Navigation Clinic [phone]  02/16/2023 8:59 AM

## 2023-02-16 NOTE — Telephone Encounter (Signed)
[name] Encounter  Funding opened while in process of PAP. PAP cancelled and grant funding obtained.    Was successful in securing patient a $4,000.00 grant from [employer] to provide copayment coverage for Orserdu.  This will keep the out of pocket expense at $0.      The billing information is as follows and has been shared with [facility].   Member ID: [id number] Group ID: [id number] RxBin: [id number] PCN: [id number] Dates of Eligibility: 02/16/23 through 02/16/24  Fund name:  Breast Cancer   Ardeen Fillers, CPhT Oncology Pharmacy Patient Advocate  [facility]  [phone] (phone) [phone] (fax) 02/16/2023 1:58 PM

## 2023-02-17 LAB — HEMOGLOBIN A1C
Hgb A1c MFr Bld: 5.3 % (ref 4.8–5.6)
Mean Plasma Glucose: 105 mg/dL

## 2023-02-18 NOTE — Telephone Encounter (Signed)
Oral Chemotherapy Pharmacist Encounter  [facility] should be reaching out to Ms. Wahls any day to set-up medication delivery. I asked her to call [facility] tomorrow if she has not heard anything about medication delivery.  Patient Education I spoke with patient for overview of new oral chemotherapy medication: Orserdu (elacestrant) for the treatment of metastatic breast cancer, ER/PR positive and HER2 negative, ESR1 positive (Guardant 360), planned duration until disease progression or unacceptable drug toxicity.    Counseled patient on administration, dosing, side effects, monitoring, drug-food interactions, safe handling, storage, and disposal. Patient will take 1 tablet (345 mg total) by mouth daily. Take with food.   Side effects include but not limited to: nausea, fatigue, muscle pain.    Reviewed with patient importance of keeping a medication schedule and plan for any missed doses.  After discussion with patient, no patient barriers to medication adherence identified.   Ms. Papka voiced understanding and appreciation. All questions answered. Medication handout provided.  Provided patient with Oral Chemotherapy Navigation Clinic phone number. Patient knows to call the office with questions or concerns. Oral Chemotherapy Navigation Clinic will continue to follow.  Remi Haggard, PharmD, BCPS, BCOP, CPP Hematology/Oncology Clinical Pharmacist Practitioner /DB/AP Oral Chemotherapy Navigation Clinic [phone]  02/18/2023 1:36 PM

## 2023-02-18 NOTE — Telephone Encounter (Signed)
[facility] has confirmed $0 co-pay with grant. Patient knows to expect a call from [facility] to set up shipment and delivery of medication. Patient was advised by pharmacist to call [facility] if they have not heard anything by tomorrow, 02/19/23.    Ardeen Fillers, CPhT Oncology Pharmacy Patient Advocate  [facility]  [phone] (phone) [phone] (fax) 02/18/2023 3:27 PM

## 2023-03-02 ENCOUNTER — Other Ambulatory Visit: Payer: Self-pay

## 2023-03-02 DIAGNOSIS — G893 Neoplasm related pain (acute) (chronic): Secondary | ICD-10-CM

## 2023-03-02 DIAGNOSIS — C7951 Secondary malignant neoplasm of bone: Secondary | ICD-10-CM

## 2023-03-02 MED ORDER — OXYCODONE-ACETAMINOPHEN 5-325 MG PO TABS
1.0000 | ORAL_TABLET | Freq: Two times a day (BID) | ORAL | 0 refills | Status: DC | PRN
Start: 2023-03-02 — End: 2023-03-25

## 2023-03-16 ENCOUNTER — Inpatient Hospital Stay: Payer: Medicare Other

## 2023-03-16 ENCOUNTER — Inpatient Hospital Stay: Payer: Medicare Other | Admitting: Hematology

## 2023-03-25 ENCOUNTER — Other Ambulatory Visit: Payer: Self-pay | Admitting: *Deleted

## 2023-03-25 DIAGNOSIS — G893 Neoplasm related pain (acute) (chronic): Secondary | ICD-10-CM

## 2023-03-25 DIAGNOSIS — C7951 Secondary malignant neoplasm of bone: Secondary | ICD-10-CM

## 2023-03-25 MED ORDER — OXYCODONE-ACETAMINOPHEN 5-325 MG PO TABS
1.0000 | ORAL_TABLET | Freq: Two times a day (BID) | ORAL | 0 refills | Status: DC | PRN
Start: 2023-03-25 — End: 2023-03-30

## 2023-03-29 ENCOUNTER — Other Ambulatory Visit: Payer: Self-pay

## 2023-03-29 DIAGNOSIS — C7951 Secondary malignant neoplasm of bone: Secondary | ICD-10-CM

## 2023-03-29 DIAGNOSIS — C50912 Malignant neoplasm of unspecified site of left female breast: Secondary | ICD-10-CM

## 2023-03-29 NOTE — Progress Notes (Signed)
[facility] [address], [place] [address]    Clinic Day:  03/30/2023  Referring physician: No ref. provider found  Patient Care Team: Default, Provider, MD as PCP - General [name], MD as Medical Oncologist (Medical Oncology)   ASSESSMENT & PLAN:   Assessment: 1.  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  She did not seek medical attention.  She did not have mammograms for the past 30 years. -Reported 26 pound weight loss which was intentional in the last 6 months. -Developed fracture of the proximal left humeral diaphysis when she picked up her cat. -CT humerus left without contrast on 05/23/2020 shows minimally displaced oblique fracture of the proximal left humeral diaphysis with no definitive CT evidence of underlying lesion.  Nonspecific left axillary adenopathy. -Physical exam today reveals left breast mass occupying the majority of the outer quadrant with skin thickening.  Could not palpate the axillary lymph node as she was unable to lift her arm. -PET scan on 06/24/2020 showed irregular hypermetabolic 8.1 x 4.0 left breast mass compatible with malignancy with left axillary nodal metastasis.  Extensive multifocal mixed lytic and sclerotic metastatic disease throughout the axial and proximal appendicular skeleton. -Ultrasound-guided left breast biopsy on 07/11/2020 shows invasive mammary carcinoma, grade 2.  ER/PR 90% positive.  Ki-67 60%.  HER-2 2+ by IHC.  HER-2 negative by FISH. -Anastrozole and palbociclib from 07/19/2020 through 02/15/2023 with progression - Germline mutation testing was negative. - Bone scan: Single new focus of uptake in the mid thoracic spine at T8. - PET scan (01/21/2023): Diffuse lytic/sclerotic bone mets-right glenoid, left sixth rib, T9 vertebral body, L1 vertebral body, left established lesion, left proximal femur lesion and right humerus.   No visceral lesion. - Guardant360 (01/18/2023): ESR1 Y537N and ESR1 Y537S.  BRCA2 12164fs.  MSI high not detected. -Elacestrant 345 mg daily   2.  Social/family history: -Lives at home with husband.  Never smoker.  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    Plan: 1.  Metastatic ER positive left breast cancer to the bones: - She had right humerus nailing done. - We have reviewed the PET scan images which showed diffuse lytic sclerotic bone metastatic disease, some of the lesions are new when compared to PET scan from 06/24/2020. - We reviewed Guardant360 results which showed ESR 1 mutation. - Recommend discontinuing anastrozole and palbociclib. - Recommend starting on Elacestrant 345 mg daily with food symptom every day. - We discussed side effects including hyponatremia, hot flashes, increased cholesterol levels, GI side effects like nausea/vomiting, elevated liver enzymes, musculoskeletal pains and fatigue. - Will send in Compazine to take as needed. - We will obtain baseline fasting lipid panel, hemoglobin A1c, hepatitis B surface antigen, surface antibody and core antibody. - RTC 4 weeks for follow-up.   2.  Bone metastasis: - Denosumab was discussed to decrease SRE.  Because of poor dentition it was not started.   3.  Chronic back pain: - Continue Percocet 1 tablet in the morning and occasionally second tablet in the evening.   4.  Low vitamin D levels: - Continue vitamin D weekly.  Vitamin D level was normal.   5.  Systemic thyroid nodule: - CT scan showed 3.1 cm Histamic thyroid nodule.  PET scan did not show hypermetabolism.    No orders of the defined types were placed  in this encounter.      Alben Deeds Teague,acting as a Neurosurgeon for [name], MD.,have documented all relevant documentation on the behalf of [name], MD,as directed by  [name], MD while in the presence of  [name], MD.  ***   [place] R Teague   7/15/202412:45 PM  CHIEF COMPLAINT:   Diagnosis: metastatic left breast cancer    Cancer Staging  No matching staging information was found for the patient.    Prior Therapy: none  Current Therapy:  Anastrozole, Ibrance    HISTORY OF PRESENT ILLNESS:   Oncology History   No history exists.     INTERVAL HISTORY:   Kaitlin Baker is a 67 y.o. female presenting to clinic today for follow up of metastatic left breast cancer. She was last seen by me on 02/15/23.  Today, she states that she is doing well overall. Her appetite level is at ***%. Her energy level is at ***%.  PAST MEDICAL HISTORY:   Past Medical History: Past Medical History:  Diagnosis Date   Arthritis    Breast cancer (HCC)    Family history of breast cancer    High cholesterol    PONV (postoperative nausea and vomiting)     Surgical History: Past Surgical History:  Procedure Laterality Date   CHOLECYSTECTOMY     HUMERUS IM NAIL Right 01/22/2023   Procedure: INTRAMEDULLARY (IM) NAIL HUMERUS;  Surgeon: Yolonda Kida, MD;  Location: [facility];  Service: Orthopedics;  Laterality: Right;  90   neck surgery     plate placed    Social History: Social History   Socioeconomic History   Marital status: Unknown    Spouse name: Not on file   Number of children: 2   Years of education: Not on file   Highest education level: Not on file  Occupational History   Occupation: retired  Tobacco Use   Smoking status: Never   Smokeless tobacco: Never  Vaping Use   Vaping status: Never Used  Substance and Sexual Activity   Alcohol use: Never   Drug use: Never   Sexual activity: Not Currently  Other Topics Concern   Not on file  Social History Narrative   Not on file   Social Determinants of Health   Financial Resource Strain: Low Risk  (07/17/2020)   Overall Financial Resource Strain (CARDIA)    Difficulty of Paying Living Expenses: Not hard at all   Food Insecurity: No Food Insecurity (07/17/2020)   Hunger Vital Sign    Worried About Running Out of Food in the Last Year: Never true    Ran Out of Food in the Last Year: Never true  Transportation Needs: No Transportation Needs (07/17/2020)   PRAPARE - Administrator, Civil Service (Medical): No    Lack of Transportation (Non-Medical): No  Physical Activity: Inactive (07/17/2020)   Exercise Vital Sign    Days of Exercise per Week: 0 days    Minutes of Exercise per Session: 0 min  Stress: No Stress Concern Present (07/17/2020)   [employer] of Occupational Health - Occupational Stress Questionnaire    Feeling of Stress : Not at all  Social Connections: Moderately Integrated (07/17/2020)   Social Connection and Isolation Panel [NHANES]    Frequency of Communication with Friends and Family: Three times a week    Frequency of Social Gatherings with Friends and Family: Three times a week    Attends Religious Services: 1 to 4 times per year  Active Member of Clubs or Organizations: No    Attends Banker Meetings: Never    Marital Status: Married  Catering manager Violence: Not At Risk (07/17/2020)   Humiliation, Afraid, Rape, and Kick questionnaire    Fear of Current or Ex-Partner: No    Emotionally Abused: No    Physically Abused: No    Sexually Abused: No    Family History: Family History  Adopted: Yes  Problem Relation Age of Onset   Breast cancer Mother        dx 53s   Brain cancer Father        vs brain tumor, d. 64   Breast cancer Sister        dx 82   Breast cancer Sister        dx 38s   Liver cancer Brother    Alcohol abuse Brother    Asthma Son     Current Medications:  Current Outpatient Medications:    elacestrant hydrochloride (ORSERDU) 345 MG tablet, Take 1 tablet (345 mg total) by mouth daily. Take with food., Disp: 30 tablet, Rfl: 3   ergocalciferol (VITAMIN D2) 1.25 MG (50000 UT) capsule, Take 1 capsule (50,000 Units total)  by mouth once a week., Disp: 26 capsule, Rfl: 1   ondansetron (ZOFRAN) 4 MG tablet, Take 1 tablet (4 mg total) by mouth every 8 (eight) hours as needed for vomiting or nausea., Disp: 20 tablet, Rfl: 0   oxyCODONE-acetaminophen (PERCOCET/ROXICET) 5-325 MG tablet, Take 1 tablet by mouth every 12 (twelve) hours as needed for moderate pain., Disp: 60 tablet, Rfl: 0   prochlorperazine (COMPAZINE) 10 MG tablet, Take 1 tablet (10 mg total) by mouth every 6 (six) hours as needed for nausea or vomiting., Disp: 60 tablet, Rfl: 3   Allergies: Allergies  Allergen Reactions   Codeine Nausea And Vomiting    REVIEW OF SYSTEMS:   Review of Systems  Constitutional:  Negative for chills, fatigue and fever.  HENT:   Negative for lump/mass, mouth sores, nosebleeds, sore throat and trouble swallowing.   Eyes:  Negative for eye problems.  Respiratory:  Negative for cough and shortness of breath.   Cardiovascular:  Negative for chest pain, leg swelling and palpitations.  Gastrointestinal:  Negative for abdominal pain, constipation, diarrhea, nausea and vomiting.  Genitourinary:  Negative for bladder incontinence, difficulty urinating, dysuria, frequency, hematuria and nocturia.   Musculoskeletal:  Negative for arthralgias, back pain, flank pain, myalgias and neck pain.  Skin:  Negative for itching and rash.  Neurological:  Negative for dizziness, headaches and numbness.  Hematological:  Does not bruise/bleed easily.  Psychiatric/Behavioral:  Negative for depression, sleep disturbance and suicidal ideas. The patient is not nervous/anxious.   All other systems reviewed and are negative.    VITALS:   There were no vitals taken for this visit.  Wt Readings from Last 3 Encounters:  02/15/23 193 lb 1.6 oz (87.6 kg)  01/22/23 190 lb (86.2 kg)  01/12/23 193 lb 12.6 oz (87.9 kg)    There is no height or weight on file to calculate BMI.  Performance status (ECOG): 1 - Symptomatic but completely  ambulatory  PHYSICAL EXAM:   Physical Exam Vitals and nursing note reviewed. Exam conducted with a chaperone present.  Constitutional:      Appearance: Normal appearance.  Cardiovascular:     Rate and Rhythm: Normal rate and regular rhythm.     Pulses: Normal pulses.     Heart sounds: Normal heart sounds.  Pulmonary:     Effort: Pulmonary effort is normal.     Breath sounds: Normal breath sounds.  Abdominal:     Palpations: Abdomen is soft. There is no hepatomegaly, splenomegaly or mass.     Tenderness: There is no abdominal tenderness.  Musculoskeletal:     Right lower leg: No edema.     Left lower leg: No edema.  Lymphadenopathy:     Cervical: No cervical adenopathy.     Right cervical: No superficial, deep or posterior cervical adenopathy.    Left cervical: No superficial, deep or posterior cervical adenopathy.     Upper Body:     Right upper body: No supraclavicular or axillary adenopathy.     Left upper body: No supraclavicular or axillary adenopathy.  Neurological:     General: No focal deficit present.     Mental Status: She is alert and oriented to person, place, and time.  Psychiatric:        Mood and Affect: Mood normal.        Behavior: Behavior normal.     LABS:      Latest Ref Rng & Units 02/16/2023    7:34 AM 01/05/2023   10:18 AM 09/18/2022    8:30 AM  CBC  WBC 4.0 - 10.5 K/uL 2.2  3.4  3.8   Hemoglobin 12.0 - 15.0 g/dL 69.6  29.5  28.4   Hematocrit 36.0 - 46.0 % 35.1  38.0  38.9   Platelets 150 - 400 K/uL 228  237  307       Latest Ref Rng & Units 02/16/2023    7:34 AM 01/05/2023   10:18 AM 09/18/2022    8:30 AM  CMP  Glucose 70 - 99 mg/dL 132  99  94   BUN 8 - 23 mg/dL 17  16  16    Creatinine 0.44 - 1.00 mg/dL 4.40  1.02  7.25   Sodium 135 - 145 mmol/L 139  137  139   Potassium 3.5 - 5.1 mmol/L 3.8  3.8  4.1   Chloride 98 - 111 mmol/L 104  103  102   CO2 22 - 32 mmol/L 26  25  28    Calcium 8.9 - 10.3 mg/dL 8.9  9.3  9.0   Total Protein 6.5 - 8.1  g/dL 7.1  7.3  7.4   Total Bilirubin 0.3 - 1.2 mg/dL 0.3  0.6  0.7   Alkaline Phos 38 - 126 U/L 99  88  80   AST 15 - 41 U/L 19  21  20    ALT 0 - 44 U/L 16  21  15       No results found for: "CEA1", "CEA" / No results found for: "CEA1", "CEA" No results found for: "PSA1" No results found for: "DGU440" No results found for: "CAN125"  No results found for: "TOTALPROTELP", "ALBUMINELP", "A1GS", "A2GS", "BETS", "BETA2SER", "GAMS", "MSPIKE", "SPEI" No results found for: "TIBC", "FERRITIN", "IRONPCTSAT" No results found for: "LDH"   STUDIES:   No results found.

## 2023-03-30 ENCOUNTER — Other Ambulatory Visit: Payer: Self-pay | Admitting: *Deleted

## 2023-03-30 ENCOUNTER — Inpatient Hospital Stay: Payer: Medicare Other | Attending: Hematology

## 2023-03-30 ENCOUNTER — Inpatient Hospital Stay: Payer: Medicare Other | Admitting: Hematology

## 2023-03-30 VITALS — BP 137/80 | HR 72 | Temp 99.2°F | Resp 16 | Wt 189.9 lb

## 2023-03-30 DIAGNOSIS — E559 Vitamin D deficiency, unspecified: Secondary | ICD-10-CM | POA: Diagnosis not present

## 2023-03-30 DIAGNOSIS — C7951 Secondary malignant neoplasm of bone: Secondary | ICD-10-CM

## 2023-03-30 DIAGNOSIS — Z79811 Long term (current) use of aromatase inhibitors: Secondary | ICD-10-CM | POA: Insufficient documentation

## 2023-03-30 DIAGNOSIS — C50912 Malignant neoplasm of unspecified site of left female breast: Secondary | ICD-10-CM | POA: Diagnosis present

## 2023-03-30 DIAGNOSIS — E041 Nontoxic single thyroid nodule: Secondary | ICD-10-CM | POA: Diagnosis not present

## 2023-03-30 DIAGNOSIS — G893 Neoplasm related pain (acute) (chronic): Secondary | ICD-10-CM

## 2023-03-30 DIAGNOSIS — Z170 Estrogen receptor positive status [ER+]: Secondary | ICD-10-CM | POA: Insufficient documentation

## 2023-03-30 LAB — CBC WITH DIFFERENTIAL/PLATELET
Abs Immature Granulocytes: 0.06 10*3/uL (ref 0.00–0.07)
Basophils Absolute: 0.1 10*3/uL (ref 0.0–0.1)
Basophils Relative: 1 %
Eosinophils Absolute: 0.5 10*3/uL (ref 0.0–0.5)
Eosinophils Relative: 7 %
HCT: 38.3 % (ref 36.0–46.0)
Hemoglobin: 12.6 g/dL (ref 12.0–15.0)
Immature Granulocytes: 1 %
Lymphocytes Relative: 21 %
Lymphs Abs: 1.5 10*3/uL (ref 0.7–4.0)
MCH: 32.6 pg (ref 26.0–34.0)
MCHC: 32.9 g/dL (ref 30.0–36.0)
MCV: 99.2 fL (ref 80.0–100.0)
Monocytes Absolute: 0.6 10*3/uL (ref 0.1–1.0)
Monocytes Relative: 8 %
Neutro Abs: 4.6 10*3/uL (ref 1.7–7.7)
Neutrophils Relative %: 62 %
Platelets: 284 10*3/uL (ref 150–400)
RBC: 3.86 MIL/uL — ABNORMAL LOW (ref 3.87–5.11)
RDW: 13.2 % (ref 11.5–15.5)
WBC: 7.4 10*3/uL (ref 4.0–10.5)
nRBC: 0 % (ref 0.0–0.2)

## 2023-03-30 LAB — COMPREHENSIVE METABOLIC PANEL
ALT: 31 U/L (ref 0–44)
AST: 28 U/L (ref 15–41)
Albumin: 3.7 g/dL (ref 3.5–5.0)
Alkaline Phosphatase: 127 U/L — ABNORMAL HIGH (ref 38–126)
Anion gap: 8 (ref 5–15)
BUN: 13 mg/dL (ref 8–23)
CO2: 29 mmol/L (ref 22–32)
Calcium: 9.5 mg/dL (ref 8.9–10.3)
Chloride: 104 mmol/L (ref 98–111)
Creatinine, Ser: 0.81 mg/dL (ref 0.44–1.00)
GFR, Estimated: >60 mL/min (ref >60)
Glucose, Bld: 97 mg/dL (ref 70–99)
Potassium: 3.6 mmol/L (ref 3.5–5.1)
Sodium: 141 mmol/L (ref 135–145)
Total Bilirubin: 0.3 mg/dL (ref 0.3–1.2)
Total Protein: 7.4 g/dL (ref 6.5–8.1)

## 2023-03-30 LAB — VITAMIN D 25 HYDROXY (VIT D DEFICIENCY, FRACTURES): Vit D, 25-Hydroxy: 42.31 ng/mL (ref 30–100)

## 2023-03-30 LAB — MAGNESIUM: Magnesium: 1.9 mg/dL (ref 1.7–2.4)

## 2023-03-30 MED ORDER — OXYCODONE-ACETAMINOPHEN 5-325 MG PO TABS
1.0000 | ORAL_TABLET | Freq: Three times a day (TID) | ORAL | 0 refills | Status: DC | PRN
Start: 2023-03-30 — End: 2023-05-03

## 2023-03-30 NOTE — Progress Notes (Signed)
Patient is taking Orserdu as prescribed.  She has not missed any doses and reports no side effects at this time.

## 2023-03-30 NOTE — Patient Instructions (Signed)
[facility] Discharge Instructions   You were seen and examined today by Dr. Ellin Saba.  He reviewed the results of your lab work which are normal/stable.   Continue Orserdu as prescribed.   We will see you back in . We will repeat your lab work at that time.   Return as scheduled.    Thank you for choosing [facility] to provide your oncology and hematology care.  To afford each patient quality time with our provider, please arrive at least 15 minutes before your scheduled appointment time.   If you have a lab appointment with the Cancer Center please come in thru the Main Entrance and check in at the main information desk.  You need to re-schedule your appointment should you arrive 10 or more minutes late.  We strive to give you quality time with our providers, and arriving late affects you and other patients whose appointments are after yours.  Also, if you no show three or more times for appointments you may be dismissed from the clinic at the providers discretion.     Again, thank you for choosing [facility].  Our hope is that these requests will decrease the amount of time that you wait before being seen by our physicians.       _____________________________________________________________  Should you have questions after your visit to [facility], please contact our office at [phone] and follow the prompts.  Our office hours are 8:00 a.m. and 4:30 p.m. Monday - Friday.  Please note that voicemails left after 4:00 p.m. may not be returned until the following business day.  We are closed weekends and major holidays.  You do have access to a nurse 24-7, just call the main number to the clinic [phone] and do not press any options, hold on the line and a nurse will answer the phone.    For prescription refill requests, have your pharmacy contact our office and allow 72 hours.     Due to Covid, you will need to wear a mask upon entering the hospital. If you do not have a mask, a mask will be given to you at the Main Entrance upon arrival. For doctor visits, patients may have 1 support person age 21 or older with them. For treatment visits, patients can not have anyone with them due to social distancing guidelines and our immunocompromised population.

## 2023-03-31 LAB — CANCER ANTIGEN 15-3: CA 15-3: 23.1 U/mL (ref 0.0–25.0)

## 2023-04-30 ENCOUNTER — Ambulatory Visit
Admission: EM | Admit: 2023-04-30 | Discharge: 2023-04-30 | Disposition: A | Discharge status: Home / Self Care | Payer: Medicare Other | Source: Home / Self Care

## 2023-04-30 DIAGNOSIS — M25511 Pain in right shoulder: Secondary | ICD-10-CM | POA: Diagnosis not present

## 2023-04-30 DIAGNOSIS — Z853 Personal history of malignant neoplasm of breast: Secondary | ICD-10-CM

## 2023-04-30 DIAGNOSIS — M79621 Pain in right upper arm: Secondary | ICD-10-CM

## 2023-04-30 DIAGNOSIS — Z8589 Personal history of malignant neoplasm of other organs and systems: Secondary | ICD-10-CM

## 2023-04-30 DIAGNOSIS — R0789 Other chest pain: Secondary | ICD-10-CM | POA: Diagnosis not present

## 2023-04-30 NOTE — Discharge Instructions (Addendum)
Patient declined AVS 

## 2023-04-30 NOTE — ED Provider Notes (Signed)
[facility]    CSN: [id number] Arrival date & time: 04/30/23  0102      History   Chief Complaint No chief complaint on file.   HPI Kaitlin Baker is a 67 y.o. female.   The history is provided by the patient.   The patient presents for complaints of right arm and right shoulder pain that been present for the past month.  Patient with a history of left breast cancer with bone metastasis.  Patient had surgery in May where a rod was placed in the right arm.  She states since that time, she is had pain and discomfort in that area.  She states beginning July 10, she developed pain that radiated across her chest into the left shoulder blade.  She states that she was seen in [place] where she was seen for chest pain and right shoulder pain.  She states that she also followed up with her oncologist on 03/30/2023 regarding the worsening pain in the right shoulder.  At that time, her Percocet was increased to every 8 hours.  Since that time, patient states that she has increased difficulty moving the right arm and shoulder.  She states that it causes difficulty with her during driving.  She denies any previous injury or trauma.  Reports she has been taking Percocet, but it is only helping minimally.  Past Medical History:  Diagnosis Date   Arthritis    Breast cancer (HCC)    Family history of breast cancer    High cholesterol    PONV (postoperative nausea and vomiting)     Patient Active Problem List   Diagnosis Date Noted   Genetic testing 02/04/2021   Family history of breast cancer    Bone metastases 07/17/2020   Metastatic breast cancer 06/17/2020    Past Surgical History:  Procedure Laterality Date   CHOLECYSTECTOMY     HUMERUS IM NAIL Right 01/22/2023   Procedure: INTRAMEDULLARY (IM) NAIL HUMERUS;  Surgeon: Yolonda Kida, MD;  Location: [facility];  Service: Orthopedics;  Laterality: Right;  90   neck surgery     plate placed    OB History   No obstetric  history on file.      Home Medications    Prior to Admission medications   Medication Sig Start Date End Date Taking? Authorizing Provider  elacestrant hydrochloride (ORSERDU) 345 MG tablet Take 1 tablet (345 mg total) by mouth daily. Take with food. 02/16/23   [name], MD  ergocalciferol (VITAMIN D2) 1.25 MG (50000 UT) capsule Take 1 capsule (50,000 Units total) by mouth once a week. 10/07/22   [name], MD  ondansetron (ZOFRAN) 4 MG tablet Take 1 tablet (4 mg total) by mouth every 8 (eight) hours as needed for vomiting or nausea. 01/22/23   Yolonda Kida, MD  oxyCODONE-acetaminophen (PERCOCET/ROXICET) 5-325 MG tablet Take 1 tablet by mouth every 8 (eight) hours as needed for moderate pain. 03/30/23   [name], MD  prochlorperazine (COMPAZINE) 10 MG tablet Take 1 tablet (10 mg total) by mouth every 6 (six) hours as needed for nausea or vomiting. 02/15/23   [name], MD    Family History Family History  Adopted: Yes  Problem Relation Age of Onset   Breast cancer Mother        dx 1s   Brain cancer Father        vs brain tumor, d. 78   Breast cancer Sister  dx 30   Breast cancer Sister        dx 14s   Liver cancer Brother    Alcohol abuse Brother    Asthma Son     Social History Social History   Tobacco Use   Smoking status: Never   Smokeless tobacco: Never  Vaping Use   Vaping status: Never Used  Substance Use Topics   Alcohol use: Never   Drug use: Never     Allergies   Codeine   Review of Systems Review of Systems Per HPI  Physical Exam Triage Vital Signs ED Triage Vitals  Encounter Vitals Group     BP 04/30/23 0840 131/82     Systolic BP Percentile --      Diastolic BP Percentile --      Pulse Rate 04/30/23 0840 98     Resp 04/30/23 0840 16     Temp 04/30/23 0840 98.6 F (37 C)     Temp Source 04/30/23 0840 Oral     SpO2 04/30/23 0840 92 %     Weight --      Height --      Head  Circumference --      Peak Flow --      Pain Score 04/30/23 0833 10     Pain Loc --      Pain Education --      Exclude from Growth Chart --    No data found.  Updated Vital Signs BP 131/82 (BP Location: Left Arm)   Pulse 98   Temp 98.6 F (37 C) (Oral)   Resp 16   SpO2 92%   Visual Acuity Right Eye Distance:   Left Eye Distance:   Bilateral Distance:    Right Eye Near:   Left Eye Near:    Bilateral Near:     Physical Exam Vitals and nursing note reviewed.  Constitutional:      Appearance: Normal appearance. She is not toxic-appearing.  HENT:     Head: Normocephalic.  Eyes:     Extraocular Movements: Extraocular movements intact.     Pupils: Pupils are equal, round, and reactive to light.  Cardiovascular:     Rate and Rhythm: Normal rate and regular rhythm.     Pulses: Normal pulses.     Heart sounds: Normal heart sounds.  Pulmonary:     Effort: Pulmonary effort is normal.     Breath sounds: Normal breath sounds.  Chest:     Comments: Pain in right chest wall Abdominal:     General: Bowel sounds are normal.     Palpations: Abdomen is soft.  Musculoskeletal:     Right shoulder: Decreased range of motion.     Comments: Pain in right shoulder and right humerus.  Skin:    General: Skin is warm and dry.  Neurological:     General: No focal deficit present.     Mental Status: She is alert and oriented to person, place, and time.  Psychiatric:        Mood and Affect: Mood normal.        Behavior: Behavior normal.      UC Treatments / Results  Labs (all labs ordered are listed, but only abnormal results are displayed) Labs Reviewed - No data to display  EKG   Radiology No results found.  Procedures Procedures (including critical care time)  Medications Ordered in UC Medications - No data to display  Initial Impression / Assessment and Plan / UC  Course  I have reviewed the triage vital signs and the nursing notes.  Pertinent labs & imaging  results that were available during my care of the patient were reviewed by me and considered in my medical decision making (see chart for details).  Given the patient's past medical history, and nature of pain, discussion with patient regarding follow-up with orthopedics or with oncology, which may be more appropriate for her giving the history of breast cancer with bone metastasis.  Discussion with patient that we could offer x-rays only, and given the nature of her symptoms, it is difficult for this provider to determine the cause of her worsening pain in the setting.  Patient is in agreement to follow-up with [facility] and with oncology for further evaluation.  Patient verbalizes understanding.  All questions were answered.  Patient stable for discharge.   Final Clinical Impressions(s) / UC Diagnoses   Final diagnoses:  Right shoulder pain, unspecified chronicity  Pain in right upper arm  Right-sided chest wall pain     Discharge Instructions      Patient declined AVS     ED Prescriptions   None    PDMP not reviewed this encounter.   Abran Cantor, NP 04/30/23 [phone]

## 2023-04-30 NOTE — ED Triage Notes (Signed)
Pt reports since her rod surgery after breast cancer in May 24, pt has been having right arm and shoulder pain  x 1 month  Feels like somebody "punching her" in her left shoulder blade and it radiates over to her right side across her chest. Pain has worsened x 4 days.    Percocet is only helping minimally

## 2023-05-03 ENCOUNTER — Other Ambulatory Visit: Payer: Self-pay | Admitting: *Deleted

## 2023-05-03 ENCOUNTER — Inpatient Hospital Stay: Payer: Medicare Other

## 2023-05-03 ENCOUNTER — Inpatient Hospital Stay: Payer: Medicare Other | Attending: Hematology | Admitting: Hematology

## 2023-05-03 VITALS — BP 131/78 | HR 83 | Temp 98.2°F | Resp 16 | Wt 185.0 lb

## 2023-05-03 DIAGNOSIS — C7951 Secondary malignant neoplasm of bone: Secondary | ICD-10-CM | POA: Diagnosis not present

## 2023-05-03 DIAGNOSIS — G893 Neoplasm related pain (acute) (chronic): Secondary | ICD-10-CM

## 2023-05-03 LAB — COMPREHENSIVE METABOLIC PANEL
ALT: 17 U/L (ref 0–44)
AST: 19 U/L (ref 15–41)
Albumin: 3.5 g/dL (ref 3.5–5.0)
Alkaline Phosphatase: 133 U/L — ABNORMAL HIGH (ref 38–126)
Anion gap: 8 (ref 5–15)
BUN: 22 mg/dL (ref 8–23)
CO2: 29 mmol/L (ref 22–32)
Calcium: 9.5 mg/dL (ref 8.9–10.3)
Chloride: 102 mmol/L (ref 98–111)
Creatinine, Ser: 0.92 mg/dL (ref 0.44–1.00)
GFR, Estimated: >60 mL/min (ref >60)
Glucose, Bld: 92 mg/dL (ref 70–99)
Potassium: 4.2 mmol/L (ref 3.5–5.1)
Sodium: 139 mmol/L (ref 135–145)
Total Bilirubin: 0.3 mg/dL (ref 0.3–1.2)
Total Protein: 7.3 g/dL (ref 6.5–8.1)

## 2023-05-03 LAB — CBC WITH DIFFERENTIAL/PLATELET
Abs Immature Granulocytes: 0.04 10*3/uL (ref 0.00–0.07)
Basophils Absolute: 0 10*3/uL (ref 0.0–0.1)
Basophils Relative: 1 %
Eosinophils Absolute: 0.2 10*3/uL (ref 0.0–0.5)
Eosinophils Relative: 3 %
HCT: 37.2 % (ref 36.0–46.0)
Hemoglobin: 12 g/dL (ref 12.0–15.0)
Immature Granulocytes: 1 %
Lymphocytes Relative: 26 %
Lymphs Abs: 2 10*3/uL (ref 0.7–4.0)
MCH: 30.3 pg (ref 26.0–34.0)
MCHC: 32.3 g/dL (ref 30.0–36.0)
MCV: 93.9 fL (ref 80.0–100.0)
Monocytes Absolute: 0.6 10*3/uL (ref 0.1–1.0)
Monocytes Relative: 8 %
Neutro Abs: 4.7 10*3/uL (ref 1.7–7.7)
Neutrophils Relative %: 61 %
Platelets: 320 10*3/uL (ref 150–400)
RBC: 3.96 MIL/uL (ref 3.87–5.11)
RDW: 13.2 % (ref 11.5–15.5)
WBC: 7.6 10*3/uL (ref 4.0–10.5)
nRBC: 0 % (ref 0.0–0.2)

## 2023-05-03 LAB — MAGNESIUM: Magnesium: 2.1 mg/dL (ref 1.7–2.4)

## 2023-05-03 MED ORDER — OXYCODONE-ACETAMINOPHEN 5-325 MG PO TABS
1.0000 | ORAL_TABLET | Freq: Four times a day (QID) | ORAL | 0 refills | Status: DC | PRN
Start: 2023-05-03 — End: 2023-05-07

## 2023-05-03 NOTE — Progress Notes (Signed)
[name] [address], [place] [address]    Clinic Day:  05/03/23   Referring physician: No ref. provider found  Patient Care Team: Default, Provider, MD as PCP - General Kaitlin Massed, MD as Medical Oncologist (Medical Oncology)   ASSESSMENT & PLAN:   Assessment: 1.  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  She did not seek medical attention.  She did not have mammograms for the past 30 years. -Reported 26 pound weight loss which was intentional in the last 6 months. -Developed fracture of the proximal left humeral diaphysis when she picked up her cat. -CT humerus left without contrast on 05/23/2020 shows minimally displaced oblique fracture of the proximal left humeral diaphysis with no definitive CT evidence of underlying lesion.  Nonspecific left axillary adenopathy. -Physical exam today reveals left breast mass occupying the majority of the outer quadrant with skin thickening.  Could not palpate the axillary lymph node as she was unable to lift her arm. -PET scan on 06/24/2020 showed irregular hypermetabolic 8.1 x 4.0 left breast mass compatible with malignancy with left axillary nodal metastasis.  Extensive multifocal mixed lytic and sclerotic metastatic disease throughout the axial and proximal appendicular skeleton. -Ultrasound-guided left breast biopsy on 07/11/2020 shows invasive mammary carcinoma, grade 2.  ER/PR 90% positive.  Ki-67 60%.  HER-2 2+ by IHC.  HER-2 negative by FISH. -Anastrozole and palbociclib from 07/19/2020 through 02/15/2023 with progression - Germline mutation testing was negative. - Bone scan: Single new focus of uptake in the mid thoracic spine at T8. - PET scan (01/21/2023): Diffuse lytic/sclerotic bone mets-right glenoid, left sixth rib, T9 vertebral body, L1 vertebral body, left established lesion, left proximal femur lesion and right humerus.   No visceral lesion. - Guardant360 (01/18/2023): ESR1 Y537N and ESR1 Y537S.  BRCA2 12120fs.  MSI high not detected. -Elacestrant 345 mg daily started around 02/26/2023.   2.  Social/family history: -Lives at home with husband.  Never smoker.  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    Plan: 1.  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 - She is tolerating it reasonably well. - She was evaluated in the urgent care as she had left shoulder blade pain radiating to the left breast last Wednesday.  Pain has improved over the last few days. - Reviewed labs today: Alk phos elevated at 133.  Rest of LFTs and creatinine normal.  CBC grossly normal.  Last CEA 15-3 was 23.1. - Continue elacestrant daily.  RTC 8 weeks for follow-up.  Will plan on repeating PET scan and tumor marker prior to next visit.   2.  Bone metastasis: - Not a candidate for denosumab due to poor dentition.   3.  Chronic back pain/worsening right arm pain: - She reports worsening pains after the right humerus nailing. - Currently taking Percocet 5 mg every 8 hours which is not helping. - Will increase Percocet to every 6 hours as needed.  Recommend follow-up with orthopedics.   4.  Low vitamin D levels: - Continue vitamin D weekly.  Last vitamin D is 42.   5.  Systemic thyroid nodule: - Previous CT scan showed 3.1 cm nodule which was not PET avid.    Orders Placed This Encounter  Procedures   NM PET Image Restag (PS) Skull Base  To Thigh    Standing Status:   Future    Standing Expiration Date:   05/02/2024    Order Specific Question:   If indicated for the ordered procedure, I authorize the administration of a radiopharmaceutical per Radiology protocol    Answer:   Yes    Order Specific Question:   Preferred imaging location?    Answer:   [facility]   CBC with Differential    Standing Status:    Future    Standing Expiration Date:   05/02/2024   Comprehensive metabolic panel    Standing Status:   Future    Standing Expiration Date:   05/02/2024   Cancer antigen 15-3    Standing Status:   Future    Standing Expiration Date:   05/02/2024       Kaitlin Baker,acting as a scribe for Kaitlin Massed, MD.,have documented all relevant documentation on the behalf of Kaitlin Massed, MD,as directed by  Kaitlin Massed, MD while in the presence of Kaitlin Massed, MD.  I, Kaitlin Massed MD, have reviewed the above documentation for accuracy and completeness, and I agree with the above.     Kaitlin Massed, MD   8/19/20245:08 PM  CHIEF COMPLAINT:   Diagnosis: metastatic left breast cancer    Cancer Staging  No matching staging information was found for the patient.    Prior Therapy: none  Current Therapy:  Anastrozole, Ibrance    HISTORY OF PRESENT ILLNESS:   Oncology History   No history exists.     INTERVAL HISTORY:   Kaitlin Baker is a 67 y.o. female presenting to clinic today for follow up of metastatic left breast cancer. She was last seen by me on 03/30/23.  Since her last visit, she presented to the ED on 8/16 for right shoulder pain that radiates to the right chest wall.   Today, she states that she is doing well overall. Her appetite level is at 10%. Her energy level is at 10%.  PAST MEDICAL HISTORY:   Past Medical History: Past Medical History:  Diagnosis Date   Arthritis    Breast cancer (HCC)    Family history of breast cancer    High cholesterol    PONV (postoperative nausea and vomiting)     Surgical History: Past Surgical History:  Procedure Laterality Date   CHOLECYSTECTOMY     HUMERUS IM NAIL Right 01/22/2023   Procedure: INTRAMEDULLARY (IM) NAIL HUMERUS;  Surgeon: Kaitlin Kida, MD;  Location: [facility];  Service: Orthopedics;  Laterality: Right;  90   neck surgery     plate placed    Social History: Social  History   Socioeconomic History   Marital status: Unknown    Spouse name: Not on file   Number of children: 2   Years of education: Not on file   Highest education level: Not on file  Occupational History   Occupation: retired  Tobacco Use   Smoking status: Never   Smokeless tobacco: Never  Vaping Use   Vaping status: Never Used  Substance and Sexual Activity   Alcohol use: Never   Drug use: Never   Sexual activity: Not Currently  Other Topics Concern   Not on file  Social History Narrative   Not on file   Social Determinants of Health   Financial Resource Strain: Low Risk  (07/17/2020)   Overall Financial Resource Strain (CARDIA)    Difficulty of Paying Living Expenses: Not hard at all  Food Insecurity: No Food  Insecurity (07/17/2020)   Hunger Vital Sign    Worried About Running Out of Food in the Last Year: Never true    Ran Out of Food in the Last Year: Never true  Transportation Needs: No Transportation Needs (07/17/2020)   PRAPARE - Administrator, Civil Service (Medical): No    Lack of Transportation (Non-Medical): No  Physical Activity: Inactive (07/17/2020)   Exercise Vital Sign    Days of Exercise per Week: 0 days    Minutes of Exercise per Session: 0 min  Stress: No Stress Concern Present (07/17/2020)   [employer] of Occupational Health - Occupational Stress Questionnaire    Feeling of Stress : Not at all  Social Connections: Moderately Integrated (07/17/2020)   Social Connection and Isolation Panel [NHANES]    Frequency of Communication with Friends and Family: Three times a week    Frequency of Social Gatherings with Friends and Family: Three times a week    Attends Religious Services: 1 to 4 times per year    Active Member of Clubs or Organizations: No    Attends Banker Meetings: Never    Marital Status: Married  Catering manager Violence: Not At Risk (07/17/2020)   Humiliation, Afraid, Rape, and Kick questionnaire    Fear of  Current or Ex-Partner: No    Emotionally Abused: No    Physically Abused: No    Sexually Abused: No    Family History: Family History  Adopted: Yes  Problem Relation Age of Onset   Breast cancer Mother        dx 8s   Brain cancer Father        vs brain tumor, d. 51   Breast cancer Sister        dx 29   Breast cancer Sister        dx 42s   Liver cancer Brother    Alcohol abuse Brother    Asthma Son     Current Medications:  Current Outpatient Medications:    elacestrant hydrochloride (ORSERDU) 345 MG tablet, Take 1 tablet (345 mg total) by mouth daily. Take with food., Disp: 30 tablet, Rfl: 3   ergocalciferol (VITAMIN D2) 1.25 MG (50000 UT) capsule, Take 1 capsule (50,000 Units total) by mouth once a week., Disp: 26 capsule, Rfl: 1   ondansetron (ZOFRAN) 4 MG tablet, Take 1 tablet (4 mg total) by mouth every 8 (eight) hours as needed for vomiting or nausea., Disp: 20 tablet, Rfl: 0   prochlorperazine (COMPAZINE) 10 MG tablet, Take 1 tablet (10 mg total) by mouth every 6 (six) hours as needed for nausea or vomiting., Disp: 60 tablet, Rfl: 3   oxyCODONE-acetaminophen (PERCOCET/ROXICET) 5-325 MG tablet, Take 1 tablet by mouth every 6 (six) hours as needed for moderate pain., Disp: 120 tablet, Rfl: 0   Allergies: Allergies  Allergen Reactions   Codeine Nausea And Vomiting    REVIEW OF SYSTEMS:   Review of Systems  Constitutional:  Negative for chills, fatigue and fever.  HENT:   Negative for lump/mass, mouth sores, nosebleeds, sore throat and trouble swallowing.   Eyes:  Negative for eye problems.  Respiratory:  Negative for cough and shortness of breath.   Cardiovascular:  Negative for chest pain, leg swelling and palpitations.  Gastrointestinal:  Negative for abdominal pain, constipation, diarrhea, nausea and vomiting.  Genitourinary:  Negative for bladder incontinence, difficulty urinating, dysuria, frequency, hematuria and nocturia.   Musculoskeletal:  Negative for  arthralgias, back pain, flank pain,  myalgias and neck pain.       Right arm pain  Skin:  Negative for itching and rash.  Neurological:  Negative for dizziness, headaches and numbness.  Hematological:  Does not bruise/bleed easily.  Psychiatric/Behavioral:  Negative for depression, sleep disturbance and suicidal ideas. The patient is not nervous/anxious.   All other systems reviewed and are negative.    VITALS:   Blood pressure 131/78, pulse 83, temperature 98.2 F (36.8 C), temperature source Oral, resp. rate 16, weight 185 lb (83.9 kg), SpO2 96%.  Wt Readings from Last 3 Encounters:  05/03/23 185 lb (83.9 kg)  03/30/23 189 lb 14.4 oz (86.1 kg)  02/15/23 193 lb 1.6 oz (87.6 kg)    Body mass index is 31.76 kg/m.  Performance status (ECOG): 1 - Symptomatic but completely ambulatory  PHYSICAL EXAM:   Physical Exam Vitals and nursing note reviewed. Exam conducted with a chaperone present.  Constitutional:      Appearance: Normal appearance.  Cardiovascular:     Rate and Rhythm: Normal rate and regular rhythm.     Pulses: Normal pulses.     Heart sounds: Normal heart sounds.  Pulmonary:     Effort: Pulmonary effort is normal.     Breath sounds: Normal breath sounds.  Abdominal:     Palpations: Abdomen is soft. There is no hepatomegaly, splenomegaly or mass.     Tenderness: There is no abdominal tenderness.  Musculoskeletal:     Right lower leg: No edema.     Left lower leg: No edema.  Lymphadenopathy:     Cervical: No cervical adenopathy.     Right cervical: No superficial, deep or posterior cervical adenopathy.    Left cervical: No superficial, deep or posterior cervical adenopathy.     Upper Body:     Right upper body: No supraclavicular or axillary adenopathy.     Left upper body: No supraclavicular or axillary adenopathy.  Neurological:     General: No focal deficit present.     Mental Status: She is alert and oriented to person, place, and time.  Psychiatric:         Mood and Affect: Mood normal.        Behavior: Behavior normal.     LABS:      Latest Ref Rng & Units 05/03/2023    9:28 AM 03/30/2023    9:22 AM 02/16/2023    7:34 AM  CBC  WBC 4.0 - 10.5 K/uL 7.6  7.4  2.2   Hemoglobin 12.0 - 15.0 g/dL 16.1  09.6  04.5   Hematocrit 36.0 - 46.0 % 37.2  38.3  35.1   Platelets 150 - 400 K/uL 320  284  228       Latest Ref Rng & Units 05/03/2023    9:28 AM 03/30/2023    9:22 AM 02/16/2023    7:34 AM  CMP  Glucose 70 - 99 mg/dL 92  97  409   BUN 8 - 23 mg/dL 22  13  17    Creatinine 0.44 - 1.00 mg/dL 8.11  9.14  7.82   Sodium 135 - 145 mmol/L 139  141  139   Potassium 3.5 - 5.1 mmol/L 4.2  3.6  3.8   Chloride 98 - 111 mmol/L 102  104  104   CO2 22 - 32 mmol/L 29  29  26    Calcium 8.9 - 10.3 mg/dL 9.5  9.5  8.9   Total Protein 6.5 - 8.1 g/dL 7.3  7.4  7.1   Total Bilirubin 0.3 - 1.2 mg/dL 0.3  0.3  0.3   Alkaline Phos 38 - 126 U/L 133  127  99   AST 15 - 41 U/L 19  28  19    ALT 0 - 44 U/L 17  31  16       No results found for: "CEA1", "CEA" / No results found for: "CEA1", "CEA" No results found for: "PSA1" No results found for: "KVQ259" No results found for: "CAN125"  No results found for: "TOTALPROTELP", "ALBUMINELP", "A1GS", "A2GS", "BETS", "BETA2SER", "GAMS", "MSPIKE", "SPEI" No results found for: "TIBC", "FERRITIN", "IRONPCTSAT" No results found for: "LDH"   STUDIES:   No results found.

## 2023-05-03 NOTE — Patient Instructions (Addendum)
[facility] at [facility] Discharge Instructions   You were seen and examined today by Dr. Ellin Saba.  He reviewed the results of your lab work which are normal.   Continue Orserdu as prescribed.   Please reach out to your orthopedic surgeon to let them know that you are still experiencing a significant amount of pain since you had the surgery.   We will see you back in 8 weeks. We will repeat lab work 1 week prior.  Return as scheduled.    Thank you for choosing [facility] at [facility] to provide your oncology and hematology care.  To afford each patient quality time with our provider, please arrive at least 15 minutes before your scheduled appointment time.   If you have a lab appointment with the Cancer Center please come in thru the Main Entrance and check in at the main information desk.  You need to re-schedule your appointment should you arrive 10 or more minutes late.  We strive to give you quality time with our providers, and arriving late affects you and other patients whose appointments are after yours.  Also, if you no show three or more times for appointments you may be dismissed from the clinic at the providers discretion.     Again, thank you for choosing [facility].  Our hope is that these requests will decrease the amount of time that you wait before being seen by our physicians.       _____________________________________________________________  Should you have questions after your visit to [facility], please contact our office at [phone] and follow the prompts.  Our office hours are 8:00 a.m. and 4:30 p.m. Monday - Friday.  Please note that voicemails left after 4:00 p.m. may not be returned until the following business day.  We are closed weekends and major holidays.  You do have access to a nurse 24-7, just call the main number to the clinic [phone] and do not press any options,  hold on the line and a nurse will answer the phone.    For prescription refill requests, have your pharmacy contact our office and allow 72 hours.    Due to Covid, you will need to wear a mask upon entering the hospital. If you do not have a mask, a mask will be given to you at the Main Entrance upon arrival. For doctor visits, patients may have 1 support person age 35 or older with them. For treatment visits, patients can not have anyone with them due to social distancing guidelines and our immunocompromised population.

## 2023-05-03 NOTE — Progress Notes (Signed)
Patient is taking Elacestrant as prescribed. She has not missed any doses and reports no side effects at this time.

## 2023-05-04 ENCOUNTER — Other Ambulatory Visit: Payer: Self-pay

## 2023-05-04 ENCOUNTER — Encounter (HOSPITAL_COMMUNITY): Payer: Self-pay

## 2023-05-04 ENCOUNTER — Emergency Department (HOSPITAL_COMMUNITY): Payer: Medicare Other

## 2023-05-04 ENCOUNTER — Inpatient Hospital Stay (HOSPITAL_COMMUNITY)
Admission: EM | Admit: 2023-05-04 | Discharge: 2023-05-07 | DRG: 948 | Disposition: A | Discharge status: Skilled Nursing Facility | Payer: Medicare Other | Attending: Infectious Diseases | Admitting: Infectious Diseases

## 2023-05-04 DIAGNOSIS — C7951 Secondary malignant neoplasm of bone: Secondary | ICD-10-CM

## 2023-05-04 DIAGNOSIS — E041 Nontoxic single thyroid nodule: Secondary | ICD-10-CM | POA: Diagnosis present

## 2023-05-04 DIAGNOSIS — Z170 Estrogen receptor positive status [ER+]: Secondary | ICD-10-CM

## 2023-05-04 DIAGNOSIS — Z66 Do not resuscitate: Secondary | ICD-10-CM | POA: Diagnosis present

## 2023-05-04 DIAGNOSIS — Z602 Problems related to living alone: Secondary | ICD-10-CM | POA: Diagnosis present

## 2023-05-04 DIAGNOSIS — Z515 Encounter for palliative care: Secondary | ICD-10-CM

## 2023-05-04 DIAGNOSIS — Z803 Family history of malignant neoplasm of breast: Secondary | ICD-10-CM

## 2023-05-04 DIAGNOSIS — K5903 Drug induced constipation: Secondary | ICD-10-CM | POA: Diagnosis not present

## 2023-05-04 DIAGNOSIS — M1990 Unspecified osteoarthritis, unspecified site: Secondary | ICD-10-CM | POA: Diagnosis present

## 2023-05-04 DIAGNOSIS — G893 Neoplasm related pain (acute) (chronic): Principal | ICD-10-CM

## 2023-05-04 DIAGNOSIS — C50919 Malignant neoplasm of unspecified site of unspecified female breast: Secondary | ICD-10-CM | POA: Diagnosis not present

## 2023-05-04 DIAGNOSIS — K449 Diaphragmatic hernia without obstruction or gangrene: Secondary | ICD-10-CM | POA: Diagnosis present

## 2023-05-04 DIAGNOSIS — C50912 Malignant neoplasm of unspecified site of left female breast: Secondary | ICD-10-CM | POA: Diagnosis present

## 2023-05-04 DIAGNOSIS — E559 Vitamin D deficiency, unspecified: Secondary | ICD-10-CM | POA: Diagnosis present

## 2023-05-04 DIAGNOSIS — E441 Mild protein-calorie malnutrition: Secondary | ICD-10-CM | POA: Diagnosis present

## 2023-05-04 DIAGNOSIS — Z79899 Other long term (current) drug therapy: Secondary | ICD-10-CM

## 2023-05-04 DIAGNOSIS — Z9049 Acquired absence of other specified parts of digestive tract: Secondary | ICD-10-CM

## 2023-05-04 DIAGNOSIS — F064 Anxiety disorder due to known physiological condition: Secondary | ICD-10-CM | POA: Diagnosis present

## 2023-05-04 DIAGNOSIS — Z885 Allergy status to narcotic agent status: Secondary | ICD-10-CM

## 2023-05-04 DIAGNOSIS — E785 Hyperlipidemia, unspecified: Secondary | ICD-10-CM | POA: Diagnosis present

## 2023-05-04 LAB — CBC WITH DIFFERENTIAL/PLATELET
Abs Immature Granulocytes: 0.05 10*3/uL (ref 0.00–0.07)
Basophils Absolute: 0 10*3/uL (ref 0.0–0.1)
Basophils Relative: 1 %
Eosinophils Absolute: 0.1 10*3/uL (ref 0.0–0.5)
Eosinophils Relative: 1 %
HCT: 38.7 % (ref 36.0–46.0)
Hemoglobin: 12.6 g/dL (ref 12.0–15.0)
Immature Granulocytes: 1 %
Lymphocytes Relative: 16 %
Lymphs Abs: 1.3 10*3/uL (ref 0.7–4.0)
MCH: 29.5 pg (ref 26.0–34.0)
MCHC: 32.6 g/dL (ref 30.0–36.0)
MCV: 90.6 fL (ref 80.0–100.0)
Monocytes Absolute: 0.4 10*3/uL (ref 0.1–1.0)
Monocytes Relative: 5 %
Neutro Abs: 6.3 10*3/uL (ref 1.7–7.7)
Neutrophils Relative %: 76 %
Platelets: 326 10*3/uL (ref 150–400)
RBC: 4.27 MIL/uL (ref 3.87–5.11)
RDW: 13.2 % (ref 11.5–15.5)
WBC: 8.1 10*3/uL (ref 4.0–10.5)
nRBC: 0 % (ref 0.0–0.2)

## 2023-05-04 LAB — COMPREHENSIVE METABOLIC PANEL
ALT: 16 U/L (ref 0–44)
AST: 23 U/L (ref 15–41)
Albumin: 3.4 g/dL — ABNORMAL LOW (ref 3.5–5.0)
Alkaline Phosphatase: 133 U/L — ABNORMAL HIGH (ref 38–126)
Anion gap: 15 (ref 5–15)
BUN: 13 mg/dL (ref 8–23)
CO2: 24 mmol/L (ref 22–32)
Calcium: 9.5 mg/dL (ref 8.9–10.3)
Chloride: 101 mmol/L (ref 98–111)
Creatinine, Ser: 0.88 mg/dL (ref 0.44–1.00)
GFR, Estimated: >60 mL/min (ref >60)
Glucose, Bld: 104 mg/dL — ABNORMAL HIGH (ref 70–99)
Potassium: 3.5 mmol/L (ref 3.5–5.1)
Sodium: 140 mmol/L (ref 135–145)
Total Bilirubin: 0.5 mg/dL (ref 0.3–1.2)
Total Protein: 7.4 g/dL (ref 6.5–8.1)

## 2023-05-04 MED ORDER — HYDROMORPHONE HCL 1 MG/ML IJ SOLN: 1.0000 mg | INTRAMUSCULAR | Status: DC | PRN

## 2023-05-04 MED ORDER — MORPHINE SULFATE 15 MG PO TABS
15.0000 mg | ORAL_TABLET | Freq: Once | ORAL | Status: DC
  Filled 2023-05-04: qty 1

## 2023-05-04 MED ORDER — CAPSAICIN 0.025 % EX CREA: TOPICAL_CREAM | Freq: Two times a day (BID) | CUTANEOUS | Status: DC | PRN

## 2023-05-04 MED ORDER — SENNA 8.6 MG PO TABS
1.0000 | ORAL_TABLET | Freq: Every day | ORAL | Status: DC
  Administered 2023-05-05: 8.6 mg via ORAL
  Filled 2023-05-04: qty 1

## 2023-05-04 MED ORDER — OXYCODONE-ACETAMINOPHEN 5-325 MG PO TABS: 1.0000 | ORAL_TABLET | Freq: Once | ORAL | Status: DC

## 2023-05-04 MED ORDER — METHOCARBAMOL 500 MG PO TABS: 500.0000 mg | ORAL_TABLET | Freq: Once | ORAL | Status: DC

## 2023-05-04 MED ORDER — HYDROMORPHONE HCL 1 MG/ML IJ SOLN
0.5000 mg | INTRAMUSCULAR | Status: DC | PRN
  Administered 2023-05-04 – 2023-05-07 (×10): 0.5 mg via INTRAVENOUS
  Filled 2023-05-04 (×11): qty 0.5

## 2023-05-04 MED ORDER — POLYETHYLENE GLYCOL 3350 17 G PO PACK
17.0000 g | PACK | Freq: Every day | ORAL | Status: DC
  Administered 2023-05-05: 17 g via ORAL
  Filled 2023-05-04: qty 1

## 2023-05-04 MED ORDER — OXYCODONE HCL 5 MG PO TABS
10.0000 mg | ORAL_TABLET | Freq: Once | ORAL | Status: AC
  Administered 2023-05-04: 10 mg via ORAL
  Filled 2023-05-04: qty 2

## 2023-05-04 MED ORDER — OXYCODONE HCL 5 MG PO TABS
10.0000 mg | ORAL_TABLET | Freq: Four times a day (QID) | ORAL | Status: DC | PRN
  Administered 2023-05-04 – 2023-05-05 (×2): 10 mg via ORAL
  Filled 2023-05-04 (×2): qty 2

## 2023-05-04 MED ORDER — LIDOCAINE 5 % EX PTCH
1.0000 | MEDICATED_PATCH | CUTANEOUS | Status: DC
  Administered 2023-05-04 – 2023-05-07 (×4): 1 via TRANSDERMAL
  Filled 2023-05-04 (×4): qty 1

## 2023-05-04 NOTE — ED Notes (Signed)
Pt is a&ox4, pwd. Pt complains of 3/10 pain which is her baseline. She says she feels fine as long as she is sitting upright and not standing or laying flat. Side rails up x 2, call light with patient. Family at the bedside.

## 2023-05-04 NOTE — Plan of Care (Signed)
Patient alert/oriented X4. Patient upon arrival was in severe pain, dilaudid administered. Patient now in minimal amounts of pain. Patient skin assessed with Freida Busman, RN. No skin issues noted. Lidocaine patch applied on lower back and patient tolerating a regular diet. Bed alarm on, bed in lowest position. VSS, will continue to monitor.

## 2023-05-04 NOTE — ED Provider Notes (Signed)
[facility] GENERAL MED/SURG UNIT Provider Note   CSN: [id number] Arrival date & time: 05/04/23  4098     History {Add pertinent medical, surgical, social history, OB history to HPI:1} Chief Complaint  Patient presents with   Back Pain    Kaitlin Baker is a 67 y.o. female.  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  Home Medications Prior to Admission medications   Medication Sig Start Date End Date Taking? Authorizing Provider  elacestrant hydrochloride (ORSERDU) 345 MG tablet Take 1 tablet (345 mg total) by mouth daily. Take with food. 02/16/23  Yes [name], MD  ergocalciferol (VITAMIN D2) 1.25 MG (50000 UT) capsule Take 1 capsule (50,000 Units total) by mouth once a week. Patient taking differently: Take 50,000 Units by mouth every Sunday. 10/07/22  Yes [name], MD  oxyCODONE-acetaminophen (PERCOCET/ROXICET) 5-325 MG tablet Take 1 tablet by mouth every 6 (six) hours as needed for moderate pain. 05/03/23  Yes [name], MD  prochlorperazine (COMPAZINE) 10 MG tablet Take 1 tablet (10 mg total) by mouth every 6 (six) hours as needed for nausea or vomiting. 02/15/23  Yes [name], MD      Allergies    Codeine    Review of Systems   Review of Systems  Physical Exam Updated Vital Signs BP 120/70   Pulse 79   Temp 98.3 F (36.8 C) (Oral)   Resp 20   SpO2 96%  Physical Exam  ED Results / Procedures / Treatments   Labs (all labs ordered are listed, but only abnormal results are displayed) Labs Reviewed  COMPREHENSIVE METABOLIC PANEL - Abnormal; Notable for the following components:      Result Value   Glucose, Bld 104 (*)    Albumin 3.4 (*)    Alkaline Phosphatase 133 (*)    All other components within normal limits  CBC WITH DIFFERENTIAL/PLATELET   HIV ANTIBODY (ROUTINE TESTING W REFLEX)  COMPREHENSIVE METABOLIC PANEL  CBC    EKG None  Radiology CT T-SPINE NO CHARGE  Result Date: 05/04/2023 CLINICAL DATA:  67 year old female with back pain. Breast cancer with known skeletal metastases. EXAM: CT THORACIC SPINE WITHOUT CONTRAST TECHNIQUE: Multiplanar CT images of the thoracic spine were reconstructed from contemporary CT of the Chest. RADIATION DOSE REDUCTION: This exam was performed according to the departmental dose-optimization program which includes automated exposure control, adjustment of the mA and/or kV according to patient size and/or use of iterative reconstruction technique. CONTRAST:  None COMPARISON:  CT Chest today are reported separately. PET-CT 01/21/2023. Whole-body bone scan 09/18/2022. FINDINGS: Limited cervical spine imaging: Partially visible cervical ACDF through C7. Thoracic spine segmentation:  Normal. Alignment: Overall thoracic kyphosis has not significantly changed since January. Vertebrae: Diffuse osseous metastatic disease with mixed lytic and sclerotic lesions at all thoracic vertebral levels. Some hardware artifact from the cervical spine but T1 vertebral body osteolysis appears progressed since January. T2 and T3 appears stable since January, chronic pathologic compression fracture at the latter. T4 anterior and posterior vertebral body osteolysis has progressed since January. T5 appears stable and largely intact. Posterior T6 and T7 osteolysis is only mildly progressed since January. T8 lytic metastases with chronic pathologic compression fracture is largely stable, but posterior body osteolysis has somewhat progressed since January. T9 is stable. T10 posterior vertebral body osteolysis is  substantially new since January. T11 mild chronic pathologic compression fracture is stable but osteolysis of the body has progressed. New T12 pathologic compression fracture since January (40% loss of height and chronic but  increased vertebral body osteolysis. L1 vertebral body osteolysis has also progressed while mild compression appears stable. Paraspinal and other soft tissues: Paraspinal soft tissue tumor has increased since the January CT at T8, T10, T12. And un uncertain degree of thoracic spinal canal involvement is present at those levels. Other chest findings today reported separately. Disc levels: Cannot exclude malignant thoracic spinal stenosis. IMPRESSION: 1. Diffuse skeletal metastases with progression of thoracic vertebral lytic lesions since January at T1, T4, T10, T11, and T12. And associated new or increased paraspinal/extraosseous extension of tumor at T8, T10, and T12. Epidural tumor infiltration and malignant thoracic spinal stenosis not excluded. Thoracic MRI without and with contrast would best evaluate further. 2. New pathologic compression fracture at T12 since January (40% loss of height). Stable pathologic compression fractures elsewhere. 3. Other Chest findings today reported separately. Electronically Signed   By: Odessa Fleming M.D.   On: 05/04/2023 12:11   CT Chest Wo Contrast  Result Date: 05/04/2023 CLINICAL DATA:  67 year old female with back pain. Breast cancer with known skeletal metastases. EXAM: CT CHEST WITHOUT CONTRAST TECHNIQUE: Multidetector CT imaging of the chest was performed following the standard protocol without IV contrast. RADIATION DOSE REDUCTION: This exam was performed according to the departmental dose-optimization program which includes automated exposure control, adjustment of the mA and/or kV according to patient size and/or use of iterative reconstruction technique. COMPARISON:  PET-CT 01/21/2023, restaging CT Chest, Abdomen, and Pelvis with contrast 09/18/2022. Thoracic spine CT today reported separately. FINDINGS: Cardiovascular: Calcified coronary artery (series 3, image 79) and Calcified aortic atherosclerosis. Vascular patency is not evaluated in the absence of IV contrast.  Borderline cardiomegaly now. No pericardial effusion. Mediastinum/Nodes: Chronic heterogeneous enlargement of the thyroid isthmus appears stable since 2021and negative by PET, In the setting of significant comorbidities or limited life expectancy, no follow-up recommended (ref: J Am Coll Radiol. 2015 Feb;12(2): 143-50). No mediastinal mass or lymphadenopathy is evident in the absence of contrast. Small to moderate gastric hiatal hernia appears stable. Lungs/Pleura: Major airways are patent. Lung volumes are stable since January. Platelike opacity in both lower lobe posterior basal segments now most resembles atelectasis and is superimposed on chronic scarring in the bilateral anterior and lateral basal segments. No pleural effusion. No lung nodule. Upper Abdomen: Surgically absent gallbladder. Negative visible noncontrast liver, spleen, pancreas, adrenal glands, left kidney, and bowel in the upper abdomen. No free air or free fluid. Musculoskeletal: Diffuse combined lytic and sclerotic skeletal metastases throughout the chest. Bilateral shoulder, bilateral rib, and lytic sternal metastases do not appear significantly changed since May. Thoracic spine is detailed separately. IMPRESSION: 1. Diffuse skeletal metastases. Thoracic spine CT is reported separately today. 2. No lung or mediastinal metastasis identified by noncontrast chest CT. Platelike atelectasis suspected in both lower lobes today, superimposed on areas of chronic lower lobe scarring. 3.  Aortic Atherosclerosis (ICD10-I70.0). Electronically Signed   By: Odessa Fleming M.D.   On: 05/04/2023 11:58    Procedures Procedures  {Document cardiac monitor, telemetry assessment procedure when appropriate:1}  Medications Ordered in ED Medications  oxyCODONE (Oxy IR/ROXICODONE) immediate release tablet 10 mg (has no administration in time range)  senna (SENOKOT) tablet 8.6 mg (8.6 mg Oral Patient Refused/Not Given 05/04/23 1542)  polyethylene glycol (MIRALAX /  GLYCOLAX) packet 17 g (17 g Oral Patient Refused/Not  Given 05/04/23 1542)  lidocaine (LIDODERM) 5 % 1 patch (1 patch Transdermal Patch Applied 05/04/23 1542)  capsaicin (ZOSTRIX) 0.025 % cream (has no administration in time range)  HYDROmorphone (DILAUDID) injection 0.5 mg (0.5 mg Intravenous Given 05/04/23 1542)  oxyCODONE (Oxy IR/ROXICODONE) immediate release tablet 10 mg (10 mg Oral Given 05/04/23 1020)    ED Course/ Medical Decision Making/ A&P   {   Click here for ABCD2, HEART and other calculatorsREFRESH Note before signing :1}                              Medical Decision Making Amount and/or Complexity of Data Reviewed Labs: ordered. Radiology: ordered.  Risk Prescription drug management. Decision regarding hospitalization.   ***  {Document critical care time when appropriate:1} {Document review of labs and clinical decision tools ie heart score, Chads2Vasc2 etc:1}  {Document your independent review of radiology images, and any outside records:1} {Document your discussion with family members, caretakers, and with consultants:1} {Document social determinants of health affecting pt's care:1} {Document your decision making why or why not admission, treatments were needed:1} Final Clinical Impression(s) / ED Diagnoses Final diagnoses:  None    Rx / DC Orders ED Discharge Orders     None

## 2023-05-04 NOTE — ED Notes (Signed)
Walked patient to the bathroom patient did well patient is now back in bed on the monitor with family at bedside and call bell in reach  ?

## 2023-05-04 NOTE — ED Notes (Signed)
Pt amble to ambulate to bathroom with minimal assist. Pt CO back pain while walking.

## 2023-05-04 NOTE — Hospital Course (Signed)
Cancer pain. Pain in her back. Pain is worse when she stands up or leans back, couldn't take two steps, and got real nauseated. Right where her shoulder blade is and start going down her spine. Could feel something there, feels like someone has shot a cannon ball that's hit her. Centralized right there in that spot. Gets better when sitting in chair. No fevers, chills, chest pain  January   PMH: Metastatic ER positive left breast cancer to the bones  Meds:  Percocet  Elacestrant 354mg    Social:  - Lives with dog and two cats  - Help at home? 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

## 2023-05-04 NOTE — ED Triage Notes (Signed)
Pt arrives via [facility] from home for six days of L lower rib cage pain and L lower back pain. Pt has hx of malignant breast cancer that has spread to her bones. Pt denies recent injuries.

## 2023-05-04 NOTE — Evaluation (Signed)
Physical Therapy Evaluation Patient Details Name: Kaitlin Baker MRN: [id number] DOB: 12/26/1955 Today's Date: 05/04/2023  History of Present Illness  67 y.o. female presents to [facility] hospital on 05/04/2023 with unbearable L back pain related to known bony metastases. PMH includes L breast CA with bony mets to spine, ribs, hips, HLD, OA.  Clinical Impression  Pt presents to PT with deficits in functional mobility, gait, strength, power, endurance. Pt is greatly limited by L low back pain  at this time. Pt tolerates bed mobility well, but is unable to stand due to sharp pain in L low back. Pt only tolerates ~3 minutes sitting prior to pain increasing and needing to return to lying. Pt will benefit from continued attempts at mobilization as her goal is to return home to her camper. To do this successfully the pt will need to be able to stand and pivot from lift chair to power wheelchair and BSC. Pt would also need to be able to complete ADLs independently as she has no consistent caregiver support, OT consult placed.  PT recommends short term inpatient PT services at the time of discharge as the pt is not currently able to mobilize well enough to care for herself.      If plan is discharge home, recommend the following: A lot of help with walking and/or transfers;A lot of help with bathing/dressing/bathroom;Assistance with cooking/housework;Assist for transportation;Help with stairs or ramp for entrance   Can travel by private vehicle   No    Equipment Recommendations BSC/3in1 (further DME recommendations to be made following next session)  Recommendations for Other Services       Functional Status Assessment Patient has had a recent decline in their functional status and demonstrates the ability to make significant improvements in function in a reasonable and predictable amount of time.     Precautions / Restrictions Precautions Precautions: Fall Precaution Comments: multiple areas of bony  metastases Restrictions Weight Bearing Restrictions: No      Mobility  Bed Mobility Overal bed mobility: Needs Assistance Bed Mobility: Rolling, Sidelying to Sit Rolling: Supervision Sidelying to sit: Contact guard assist       General bed mobility comments: pt's sister assists back to supine while PT returning to room with [facility]    Transfers Overall transfer level: Needs assistance Equipment used: 1 person hand held assist Transfers: Sit to/from Stand Sit to Stand:  (unable to stand due to pain in L low back with attempts at initiating STS)                Ambulation/Gait                  Stairs            Wheelchair Mobility     Tilt Bed    Modified Rankin (Stroke Patients Only)       Balance Overall balance assessment: Needs assistance Sitting-balance support: No upper extremity supported Sitting balance-Leahy Scale: Fair                                       Pertinent Vitals/Pain Pain Assessment Pain Assessment: 0-10 Pain Score: 9  (initially 3/10 with bed mobility, pain increases to 9/10 after 2-3 minutes sitting) Pain Location: left low back Pain Descriptors / Indicators: Aching Pain Intervention(s): Limited activity within patient's tolerance    Home Living Family/patient expects to be discharged to:: Private residence  Living Arrangements: Alone Available Help at Discharge: Friend(s);Neighbor;Available PRN/intermittently (closest reliable help 25 min away) 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) Home Access: Stairs to enter Entrance Stairs-Rails: Left Entrance Stairs-Number of Steps: 4   Home Layout: One level Home Equipment: Other (comment) Soil scientist)      Prior Function Prior Level of Function : Independent/Modified Independent             Mobility Comments: ambulatory without DME       Extremity/Trunk Assessment   Upper Extremity Assessment Upper Extremity Assessment: RUE  deficits/detail RUE Deficits / Details: pt with impairment in shoulder strength, grossly 3-/5, elbow distally 4-/5    Lower Extremity Assessment Lower Extremity Assessment: Generalized weakness    Cervical / Trunk Assessment Cervical / Trunk Assessment: Normal  Communication   Communication Communication: No apparent difficulties Cueing Techniques: Verbal cues  Cognition Arousal: Alert Behavior During Therapy: WFL for tasks assessed/performed Overall Cognitive Status: Within Functional Limits for tasks assessed                                          General Comments General comments (skin integrity, edema, etc.): VSS on RA    Exercises     Assessment/Plan    PT Assessment Patient needs continued PT services  PT Problem List Decreased strength;Decreased activity tolerance;Decreased balance;Decreased mobility;Pain       PT Treatment Interventions Gait training;DME instruction;Stair training;Functional mobility training;Therapeutic activities;Therapeutic exercise;Neuromuscular re-education;Balance training;Patient/family education;Wheelchair mobility training    PT Goals (Current goals can be found in the Care Plan section)  Acute Rehab PT Goals Patient Stated Goal: to return home PT Goal Formulation: With patient Time For Goal Achievement: 05/18/23 Potential to Achieve Goals: Poor Additional Goals Additional Goal #1: Pt will tolerate sitting upright for >15 minutes in an effort to improve tolerance for out of bed mobility    Frequency Min 1X/week     Co-evaluation               AM-PAC PT "6 Clicks" Mobility  Outcome Measure Help needed turning from your back to your side while in a flat bed without using bedrails?: A Little Help needed moving from lying on your back to sitting on the side of a flat bed without using bedrails?: A Little Help needed moving to and from a bed to a chair (including a wheelchair)?: Total Help needed standing up  from a chair using your arms (e.g., wheelchair or bedside chair)?: Total Help needed to walk in hospital room?: Total Help needed climbing 3-5 steps with a railing? : Total 6 Click Score: 10    End of Session   Activity Tolerance: Patient limited by pain Patient left: in bed;with call bell/phone within reach;with bed alarm set;with family/visitor present Nurse Communication: Mobility status PT Visit Diagnosis: Other abnormalities of gait and mobility (R26.89);Pain Pain - Right/Left: Left Pain - part of body:  (low back)    Time: 6213-0865 PT Time Calculation (min) (ACUTE ONLY): 20 min   Charges:   PT Evaluation $PT Eval Low Complexity: 1 Low   PT General Charges $$ ACUTE PT VISIT: 1 Visit         Arlyss Gandy, PT, DPT Acute Rehabilitation Office [phone]   Arlyss Gandy 05/04/2023, 5:45 PM

## 2023-05-04 NOTE — H&P (Signed)
Date: 05/04/2023               Patient Name:  Kaitlin Baker MRN: [id number]  DOB: October 20, 1955 Age / Sex: 67 y.o., female   PCP: Default, Provider, MD         Medical Service: Internal Medicine Teaching Service         Attending Physician: Dr. Derwood Kaplan, MD      First Contact: Larrie Kass MS4 (Med Student) [phone]    Second Contact: Dr. Olegario Messier, MD Pager [phone]         After Hours (After 5p/  First Contact Pager: [phone]  weekends / holidays): Second Contact Pager: [phone]   SUBJECTIVE   Chief Complaint: Worsening pain in left posterior rib.   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 Presented to the ED due to sharp unbearable left back pain that prevented her from being able to ambulate. Patient states on Friday she went to an urgent care because there was new pain, she endorses a baseline 3/10 back pain, but noticed it start to get worse. Yesterday, 8/19 the patient saw her oncologist and didn't address the worsening pain at that time. At 3am 8/20 the patient had pain localized in her left lower back that she described as "getting shot". She stated her baseline pain management wasn't helping with relief. Has had weight loss. decreased energy and decreased appetite.  Denies any radiation of pain. Denies any fevers, chest pain, shortness of breath, blurred vision.  Patient lives alone in a camper and was previously completely independent with ADLs. She lives with her Dog, Kaitlin Baker and two cats. Denies any tobacco use, denies alcohol use.  Patient brought up she has been considering Hospice/palliative care and wanted to obtain resources about those.  ED Course:   Patient was worked up for worsening break through pain and had a CT chest w/o contrast, and CT T-spine. She was given Oxycodone for pain  management and states that has worked well and was able to go to the bathroom but still has anxiety about position changes since that is a frequent cause for exacerbation. General labs CBC with diff, CMP, Palliative consult were placed.  Meds:  Current Meds  Medication Sig   elacestrant hydrochloride (ORSERDU) 345 MG tablet Take 1 tablet (345 mg total) by mouth daily. Take with food.   ergocalciferol (VITAMIN D2) 1.25 MG (50000 UT) capsule Take 1 capsule (50,000 Units total) by mouth once a week. (Patient taking differently: Take 50,000 Units by mouth every Sunday.)   oxyCODONE-acetaminophen (PERCOCET/ROXICET) 5-325 MG tablet Take 1 tablet by mouth every 6 (six) hours as needed for moderate pain.   prochlorperazine (COMPAZINE) 10 MG tablet Take 1 tablet (10 mg total) by mouth every 6 (six) hours as needed for nausea or vomiting.    Past Medical History  Past Surgical History:  Procedure Laterality Date   CHOLECYSTECTOMY     HUMERUS IM NAIL Right 01/22/2023   Procedure: INTRAMEDULLARY (IM) NAIL HUMERUS;  Surgeon: Yolonda Kida, MD;  Location: [facility];  Service: Orthopedics;  Laterality: Right;  90   neck surgery     plate placed    Social:  Lives With: Alone, Dog and 2 cats Occupation: N/a Support: Daughter, son, sons gf, Level of Function: when pain free independent with all ADLs Oncologist: Dr. Ellin Saba  Substances:none  Family History: Breast cancer  Allergies: Allergies as of 05/04/2023 - Review Complete 05/04/2023  Allergen Reaction Noted   Codeine Nausea And Vomiting 05/23/2020    Review of Systems: A complete ROS was negative except as per HPI.   OBJECTIVE:   Physical Exam: Blood pressure (!) 161/97, pulse 76, temperature 98.1 F (36.7 C), temperature source Oral, resp. rate 20, SpO2 99%.  Constitutional: well-appearing female sitting in bed, in no acute distress. Not moving due to fear of pain.  HENT: normocephalic atraumatic, mucous membranes  moist Eyes: conjunctiva non-erythematous Neck: supple Cardiovascular: regular rate and rhythm, no m/r/g Pulmonary/Chest: normal work of breathing on room air, lungs clear to auscultation bilaterally Abdominal: soft, non-tender, non-distended Neurological: alert & oriented x 3,  Skin: warm and dry Psych: anxious mood and appropriate/congruent affect.  MSK: Tenderness to palpation of posterior rib 9/10 on left side. Decreased ROM on R shoulder  Labs: CBC    Component Value Date/Time   WBC 8.1 05/04/2023 1013   RBC 4.27 05/04/2023 1013   HGB 12.6 05/04/2023 1013   HCT 38.7 05/04/2023 1013   PLT 326 05/04/2023 1013   MCV 90.6 05/04/2023 1013   MCH 29.5 05/04/2023 1013   MCHC 32.6 05/04/2023 1013   RDW 13.2 05/04/2023 1013   LYMPHSABS 1.3 05/04/2023 1013   MONOABS 0.4 05/04/2023 1013   EOSABS 0.1 05/04/2023 1013   BASOSABS 0.0 05/04/2023 1013     CMP     Component Value Date/Time   NA 140 05/04/2023 1013   K 3.5 05/04/2023 1013   CL 101 05/04/2023 1013   CO2 24 05/04/2023 1013   GLUCOSE 104 (H) 05/04/2023 1013   BUN 13 05/04/2023 1013   CREATININE 0.88 05/04/2023 1013   CALCIUM 9.5 05/04/2023 1013   PROT 7.4 05/04/2023 1013   ALBUMIN 3.4 (L) 05/04/2023 1013   AST 23 05/04/2023 1013   ALT 16 05/04/2023 1013   ALKPHOS 133 (H) 05/04/2023 1013   BILITOT 0.5 05/04/2023 1013   GFRNONAA >60 05/04/2023 1013   GFRAA >60 06/17/2020 1601    Imaging: Both CT scan show evidence of worsening metastases of cancer to the bone with progression.  EKG: n/a since admission  ASSESSMENT & PLAN:   Assessment & Plan by Problem:  Kaitlin Baker is a 67 y.o. female with a history of ER/PR positive breast cancer with significant mets to bones including but not limited to hips, arms, ribs, spine. who presented with breakthrough pain and admitted for pain management on hospital day 0  # Breakthrough Pain 2/2 ER/PR + Breast Cancer with Diffuse Skeletal Metastasis # Progression of  metastatic disease  # Left sided Lower thoracic/posterior rib pain # Elevated Alkaline Phosphatase  - Patient states that the Oxycodone given in the ER has significantly helped her pain. She endorses she takes percocet regularly since bone metastasis, but it has continued to worsen. She also endorses icy/hot helps relieve the pain. - Discussed option of further imaging including MRI patient declined. - Continue with Oxycodone 10mg  for pain management with Dilaudid 0.5 mg Q4h prn for severe breakthrough pain. - Bowel regimen with Miralax and Glycolax  - Can try Lidoderm patch to right shoulder and posterior left ribs, and/or capsaicin cream applied to area causing pain.  - Would appreciate PT/OT recommendations. Patient wants to return home and states she would enjoy a couple months of independence if possible but open to other living arrangements  - Continue with oncology medications Elacestrant 345 mg daily -  Appreciated palliative consult and recommendations  - Follow up with general labs CBC/CMP  # Gastric Hiatal Hernia  - Chronic Stable - Seen on patients CT Abdomen, appears stable. Patient endorsed nausea today in the setting of severe pain, denies any vomiting.    # Chronic Problems: - Thyroid nodule 3.1 cm - Low Vitamin D  Diet: Normal VTE: SCDs IVF: None,Bolus Code: DNR  Prior to Admission Living Arrangement: Home, living alone in camper Anticipated Discharge Location: SNF or Rehab Barriers to Discharge: Pain management, Palliative consult, PT/OT recommendations.  Dispo: Admit patient to Observation with expected length of stay less than 2 midnights.  Signed: Martie Round, Medical Student MS4 Student IM Service Pager [phone]  05/04/2023, 2:25 PM

## 2023-05-04 NOTE — Plan of Care (Signed)
  Problem: Health Behavior/Discharge Planning: Goal: Ability to manage health-related needs will improve 05/04/2023 2046 by Saddie Benders, RN Outcome: Progressing 05/04/2023 2039 by Saddie Benders, RN Outcome: Progressing

## 2023-05-04 NOTE — ED Notes (Signed)
ED TO INPATIENT HANDOFF REPORT  ED Nurse Name and Phone #:  Marisue Ivan [phone]  R Name/Age/Gender Kaitlin Baker 67 y.o. female Room/Bed: 043C/043C  Code Status   Code Status: DNR  Home/SNF/Other Home Patient oriented to: self, place, time, and situation Is this baseline? Yes   Triage Complete: Triage complete  Chief Complaint Primary breast cancer with metastasis to other site [facility]) [C50.919]  Triage Note Pt arrives via [facility] from home for six days of L lower rib cage pain and L lower back pain. Pt has hx of malignant breast cancer that has spread to her bones. Pt denies recent injuries.    Allergies Allergies  Allergen Reactions   Codeine Nausea And Vomiting    Level of Care/Admitting Diagnosis ED Disposition     ED Disposition  Admit   Condition  --   Comment  Hospital Area: [facility] [[id number]]  Level of Care: Med-Surg [16]  May place patient in observation at [facility] or [facility] if equivalent level of care is available:: No  Covid Evaluation: Asymptomatic - no recent exposure (last 10 days) testing not required  Diagnosis: Primary breast cancer with metastasis to other site [facility]) [[id number]]  Admitting Physician: Ginnie Smart [[id number]]  Attending Physician: Ninetta Lights, JEFFREY C [[id number]]          B Medical/Surgery History Past Medical History:  Diagnosis Date   Arthritis    Breast cancer (HCC)    Family history of breast cancer    High cholesterol    PONV (postoperative nausea and vomiting)    Past Surgical History:  Procedure Laterality Date   CHOLECYSTECTOMY     HUMERUS IM NAIL Right 01/22/2023   Procedure: INTRAMEDULLARY (IM) NAIL HUMERUS;  Surgeon: Yolonda Kida, MD;  Location: [facility];  Service: Orthopedics;  Laterality: Right;  90   neck surgery     plate placed     A IV Location/Drains/Wounds Patient Lines/Drains/Airways Status     Active Line/Drains/Airways     Name Placement date Placement time Site Days    Peripheral IV 05/04/23 20 G Right Antecubital 05/04/23  1012  Antecubital  less than 1            Intake/Output Last 24 hours No intake or output data in the 24 hours ending 05/04/23 1454  Labs/Imaging Results for orders placed or performed during the hospital encounter of 05/04/23 (from the past 48 hour(s))  Comprehensive metabolic panel     Status: Abnormal   Collection Time: 05/04/23 10:13 AM  Result Value Ref Range   Sodium 140 135 - 145 mmol/L   Potassium 3.5 3.5 - 5.1 mmol/L   Chloride 101 98 - 111 mmol/L   CO2 24 22 - 32 mmol/L   Glucose, Bld 104 (H) 70 - 99 mg/dL    Comment: Glucose reference range applies only to samples taken after fasting for at least 8 hours.   BUN 13 8 - 23 mg/dL   Creatinine, Ser 8.11 0.44 - 1.00 mg/dL   Calcium 9.5 8.9 - 91.4 mg/dL   Total Protein 7.4 6.5 - 8.1 g/dL   Albumin 3.4 (L) 3.5 - 5.0 g/dL   AST 23 15 - 41 U/L   ALT 16 0 - 44 U/L   Alkaline Phosphatase 133 (H) 38 - 126 U/L   Total Bilirubin 0.5 0.3 - 1.2 mg/dL    Comment: REPEATED TO VERIFY   GFR, Estimated >60 >60 mL/min    Comment: (NOTE) Calculated using  the CKD-EPI Creatinine Equation (2021)    Anion gap 15 5 - 15    Comment: Performed at [facility], [address] [place], [place] [address]  CBC with Differential     Status: None   Collection Time: 05/04/23 10:13 AM  Result Value Ref Range   WBC 8.1 4.0 - 10.5 K/uL   RBC 4.27 3.87 - 5.11 MIL/uL   Hemoglobin 12.6 12.0 - 15.0 g/dL   HCT 21.3 08.6 - 57.8 %   MCV 90.6 80.0 - 100.0 fL   MCH 29.5 26.0 - 34.0 pg   MCHC 32.6 30.0 - 36.0 g/dL   RDW 46.9 62.9 - 52.8 %   Platelets 326 150 - 400 K/uL   nRBC 0.0 0.0 - 0.2 %   Neutrophils Relative % 76 %   Neutro Abs 6.3 1.7 - 7.7 K/uL   Lymphocytes Relative 16 %   Lymphs Abs 1.3 0.7 - 4.0 K/uL   Monocytes Relative 5 %   Monocytes Absolute 0.4 0.1 - 1.0 K/uL   Eosinophils Relative 1 %   Eosinophils Absolute 0.1 0.0 - 0.5 K/uL   Basophils Relative 1 %   Basophils  Absolute 0.0 0.0 - 0.1 K/uL   Immature Granulocytes 1 %   Abs Immature Granulocytes 0.05 0.00 - 0.07 K/uL    Comment: Performed at [facility], [address] [place], [place] [address]   CT T-SPINE NO CHARGE  Result Date: 05/04/2023 CLINICAL DATA:  68 year old female with back pain. Breast cancer with known skeletal metastases. EXAM: CT THORACIC SPINE WITHOUT CONTRAST TECHNIQUE: Multiplanar CT images of the thoracic spine were reconstructed from contemporary CT of the Chest. RADIATION DOSE REDUCTION: This exam was performed according to the departmental dose-optimization program which includes automated exposure control, adjustment of the mA and/or kV according to patient size and/or use of iterative reconstruction technique. CONTRAST:  None COMPARISON:  CT Chest today are reported separately. PET-CT 01/21/2023. Whole-body bone scan 09/18/2022. FINDINGS: Limited cervical spine imaging: Partially visible cervical ACDF through C7. Thoracic spine segmentation:  Normal. Alignment: Overall thoracic kyphosis has not significantly changed since January. Vertebrae: Diffuse osseous metastatic disease with mixed lytic and sclerotic lesions at all thoracic vertebral levels. Some hardware artifact from the cervical spine but T1 vertebral body osteolysis appears progressed since January. T2 and T3 appears stable since January, chronic pathologic compression fracture at the latter. T4 anterior and posterior vertebral body osteolysis has progressed since January. T5 appears stable and largely intact. Posterior T6 and T7 osteolysis is only mildly progressed since January. T8 lytic metastases with chronic pathologic compression fracture is largely stable, but posterior body osteolysis has somewhat progressed since January. T9 is stable. T10 posterior vertebral body osteolysis is substantially new since January. T11 mild chronic pathologic compression fracture is stable but osteolysis of the body has progressed. New  T12 pathologic compression fracture since January (40% loss of height and chronic but increased vertebral body osteolysis. L1 vertebral body osteolysis has also progressed while mild compression appears stable. Paraspinal and other soft tissues: Paraspinal soft tissue tumor has increased since the January CT at T8, T10, T12. And un uncertain degree of thoracic spinal canal involvement is present at those levels. Other chest findings today reported separately. Disc levels: Cannot exclude malignant thoracic spinal stenosis. IMPRESSION: 1. Diffuse skeletal metastases with progression of thoracic vertebral lytic lesions since January at T1, T4, T10, T11, and T12. And associated new or increased paraspinal/extraosseous extension of tumor at T8, T10, and  T12. Epidural tumor infiltration and malignant thoracic spinal stenosis not excluded. Thoracic MRI without and with contrast would best evaluate further. 2. New pathologic compression fracture at T12 since January (40% loss of height). Stable pathologic compression fractures elsewhere. 3. Other Chest findings today reported separately. Electronically Signed   By: Odessa Fleming M.D.   On: 05/04/2023 12:11   CT Chest Wo Contrast  Result Date: 05/04/2023 CLINICAL DATA:  67 year old female with back pain. Breast cancer with known skeletal metastases. EXAM: CT CHEST WITHOUT CONTRAST TECHNIQUE: Multidetector CT imaging of the chest was performed following the standard protocol without IV contrast. RADIATION DOSE REDUCTION: This exam was performed according to the departmental dose-optimization program which includes automated exposure control, adjustment of the mA and/or kV according to patient size and/or use of iterative reconstruction technique. COMPARISON:  PET-CT 01/21/2023, restaging CT Chest, Abdomen, and Pelvis with contrast 09/18/2022. Thoracic spine CT today reported separately. FINDINGS: Cardiovascular: Calcified coronary artery (series 3, image 79) and Calcified  aortic atherosclerosis. Vascular patency is not evaluated in the absence of IV contrast. Borderline cardiomegaly now. No pericardial effusion. Mediastinum/Nodes: Chronic heterogeneous enlargement of the thyroid isthmus appears stable since 2021and negative by PET, In the setting of significant comorbidities or limited life expectancy, no follow-up recommended (ref: J Am Coll Radiol. 2015 Feb;12(2): 143-50). No mediastinal mass or lymphadenopathy is evident in the absence of contrast. Small to moderate gastric hiatal hernia appears stable. Lungs/Pleura: Major airways are patent. Lung volumes are stable since January. Platelike opacity in both lower lobe posterior basal segments now most resembles atelectasis and is superimposed on chronic scarring in the bilateral anterior and lateral basal segments. No pleural effusion. No lung nodule. Upper Abdomen: Surgically absent gallbladder. Negative visible noncontrast liver, spleen, pancreas, adrenal glands, left kidney, and bowel in the upper abdomen. No free air or free fluid. Musculoskeletal: Diffuse combined lytic and sclerotic skeletal metastases throughout the chest. Bilateral shoulder, bilateral rib, and lytic sternal metastases do not appear significantly changed since May. Thoracic spine is detailed separately. IMPRESSION: 1. Diffuse skeletal metastases. Thoracic spine CT is reported separately today. 2. No lung or mediastinal metastasis identified by noncontrast chest CT. Platelike atelectasis suspected in both lower lobes today, superimposed on areas of chronic lower lobe scarring. 3.  Aortic Atherosclerosis (ICD10-I70.0). Electronically Signed   By: Odessa Fleming M.D.   On: 05/04/2023 11:58    Pending Labs Unresulted Labs (From admission, onward)     Start     Ordered   05/05/23 0500  Comprehensive metabolic panel  Tomorrow morning,   R        05/04/23 1449   05/05/23 0500  CBC  Tomorrow morning,   R        05/04/23 1449   05/04/23 1449  HIV Antibody (routine  testing w rflx)  (HIV Antibody (Routine testing w reflex) panel)  Once,   R        05/04/23 1449            Vitals/Pain Today's Vitals   05/04/23 0849 05/04/23 1009 05/04/23 1246 05/04/23 1449  BP: (!) 144/73 (!) 153/77 (!) 161/97   Pulse: 64 81 76   Resp: (!) 21 19 20    Temp: 97.6 F (36.4 C)  98.1 F (36.7 C)   TempSrc: Oral  Oral   SpO2: 100% 100% 99%   PainSc:  7   3     Isolation Precautions No active isolations  Medications Medications  oxyCODONE (Oxy IR/ROXICODONE) immediate release tablet 10  mg (10 mg Oral Given 05/04/23 1020)    Mobility walks     Focused Assessments Cardiac Assessment Handoff:    No results found for: "CKTOTAL", "CKMB", "CKMBINDEX", "TROPONINI" No results found for: "DDIMER" Does the Patient currently have chest pain? No   , Pulmonary Assessment Handoff:  Lung sounds:   O2 Device: Room Air      R Recommendations: See Admitting Provider Note  Report given to:   Additional Notes:

## 2023-05-04 NOTE — Plan of Care (Signed)
  Problem: Health Behavior/Discharge Planning: Goal: Ability to manage health-related needs will improve Outcome: Progressing   

## 2023-05-05 DIAGNOSIS — Z885 Allergy status to narcotic agent status: Secondary | ICD-10-CM | POA: Diagnosis not present

## 2023-05-05 DIAGNOSIS — E785 Hyperlipidemia, unspecified: Secondary | ICD-10-CM | POA: Diagnosis present

## 2023-05-05 DIAGNOSIS — K5903 Drug induced constipation: Secondary | ICD-10-CM | POA: Diagnosis present

## 2023-05-05 DIAGNOSIS — C50919 Malignant neoplasm of unspecified site of unspecified female breast: Secondary | ICD-10-CM | POA: Diagnosis not present

## 2023-05-05 DIAGNOSIS — M1990 Unspecified osteoarthritis, unspecified site: Secondary | ICD-10-CM | POA: Diagnosis present

## 2023-05-05 DIAGNOSIS — Z66 Do not resuscitate: Secondary | ICD-10-CM | POA: Diagnosis present

## 2023-05-05 DIAGNOSIS — E441 Mild protein-calorie malnutrition: Secondary | ICD-10-CM | POA: Diagnosis present

## 2023-05-05 DIAGNOSIS — Z602 Problems related to living alone: Secondary | ICD-10-CM | POA: Diagnosis present

## 2023-05-05 DIAGNOSIS — G893 Neoplasm related pain (acute) (chronic): Secondary | ICD-10-CM | POA: Diagnosis present

## 2023-05-05 DIAGNOSIS — Z515 Encounter for palliative care: Secondary | ICD-10-CM

## 2023-05-05 DIAGNOSIS — C7951 Secondary malignant neoplasm of bone: Secondary | ICD-10-CM

## 2023-05-05 DIAGNOSIS — C50912 Malignant neoplasm of unspecified site of left female breast: Secondary | ICD-10-CM | POA: Diagnosis present

## 2023-05-05 DIAGNOSIS — E559 Vitamin D deficiency, unspecified: Secondary | ICD-10-CM | POA: Diagnosis present

## 2023-05-05 DIAGNOSIS — Z803 Family history of malignant neoplasm of breast: Secondary | ICD-10-CM | POA: Diagnosis not present

## 2023-05-05 DIAGNOSIS — Z9049 Acquired absence of other specified parts of digestive tract: Secondary | ICD-10-CM | POA: Diagnosis not present

## 2023-05-05 DIAGNOSIS — K449 Diaphragmatic hernia without obstruction or gangrene: Secondary | ICD-10-CM | POA: Diagnosis present

## 2023-05-05 DIAGNOSIS — F064 Anxiety disorder due to known physiological condition: Secondary | ICD-10-CM | POA: Diagnosis present

## 2023-05-05 DIAGNOSIS — E041 Nontoxic single thyroid nodule: Secondary | ICD-10-CM | POA: Diagnosis present

## 2023-05-05 DIAGNOSIS — Z79899 Other long term (current) drug therapy: Secondary | ICD-10-CM | POA: Diagnosis not present

## 2023-05-05 DIAGNOSIS — Z170 Estrogen receptor positive status [ER+]: Secondary | ICD-10-CM | POA: Diagnosis not present

## 2023-05-05 LAB — CBC
HCT: 35 % — ABNORMAL LOW (ref 36.0–46.0)
Hemoglobin: 11.2 g/dL — ABNORMAL LOW (ref 12.0–15.0)
MCH: 29.3 pg (ref 26.0–34.0)
MCHC: 32 g/dL (ref 30.0–36.0)
MCV: 91.6 fL (ref 80.0–100.0)
Platelets: 321 10*3/uL (ref 150–400)
RBC: 3.82 MIL/uL — ABNORMAL LOW (ref 3.87–5.11)
RDW: 13.2 % (ref 11.5–15.5)
WBC: 7.9 10*3/uL (ref 4.0–10.5)
nRBC: 0 % (ref 0.0–0.2)

## 2023-05-05 LAB — COMPREHENSIVE METABOLIC PANEL
ALT: 14 U/L (ref 0–44)
AST: 17 U/L (ref 15–41)
Albumin: 2.8 g/dL — ABNORMAL LOW (ref 3.5–5.0)
Alkaline Phosphatase: 119 U/L (ref 38–126)
Anion gap: 10 (ref 5–15)
BUN: 10 mg/dL (ref 8–23)
CO2: 26 mmol/L (ref 22–32)
Calcium: 9 mg/dL (ref 8.9–10.3)
Chloride: 103 mmol/L (ref 98–111)
Creatinine, Ser: 0.83 mg/dL (ref 0.44–1.00)
GFR, Estimated: >60 mL/min (ref >60)
Glucose, Bld: 91 mg/dL (ref 70–99)
Potassium: 3.4 mmol/L — ABNORMAL LOW (ref 3.5–5.1)
Sodium: 139 mmol/L (ref 135–145)
Total Bilirubin: 0.3 mg/dL (ref 0.3–1.2)
Total Protein: 6.4 g/dL — ABNORMAL LOW (ref 6.5–8.1)

## 2023-05-05 LAB — HIV ANTIBODY (ROUTINE TESTING W REFLEX): HIV Screen 4th Generation wRfx: NONREACTIVE

## 2023-05-05 MED ORDER — OXYCODONE HCL ER 15 MG PO T12A
15.0000 mg | EXTENDED_RELEASE_TABLET | Freq: Two times a day (BID) | ORAL | Status: DC
  Administered 2023-05-05 – 2023-05-07 (×5): 15 mg via ORAL
  Filled 2023-05-05 (×5): qty 1

## 2023-05-05 MED ORDER — OXYCODONE HCL 5 MG PO TABS
5.0000 mg | ORAL_TABLET | ORAL | Status: DC | PRN
  Administered 2023-05-05 – 2023-05-06 (×3): 5 mg via ORAL
  Filled 2023-05-05 (×3): qty 1

## 2023-05-05 MED ORDER — ONDANSETRON HCL 4 MG/2ML IJ SOLN: 4.0000 mg | Freq: Once | INTRAMUSCULAR | Status: DC

## 2023-05-05 MED ORDER — ELACESTRANT HYDROCHLORIDE 345 MG PO TABS
345.0000 mg | ORAL_TABLET | Freq: Every day | ORAL | Status: DC
  Administered 2023-05-06 – 2023-05-07 (×2): 345 mg via ORAL
  Filled 2023-05-05 (×2): qty 1

## 2023-05-05 MED ORDER — ACETAMINOPHEN 500 MG PO TABS
1000.0000 mg | ORAL_TABLET | Freq: Three times a day (TID) | ORAL | Status: DC
  Administered 2023-05-05 – 2023-05-07 (×7): 1000 mg via ORAL
  Filled 2023-05-05 (×7): qty 2

## 2023-05-05 NOTE — Progress Notes (Signed)
Physical Therapy Treatment Patient Details Name: Kaitlin Baker MRN: [id number] DOB: 12/30/1955 Today's Date: 05/05/2023   History of Present Illness 67 y.o. female presents to [facility] hospital on 05/04/2023 with unbearable L back pain related to known bony metastases. PMH includes L breast CA with bony mets to spine, ribs, hips, HLD, OA, 01/22/23 s/p Humeral intramedullary nail with proximal and distal  locking screw fixation.    PT Comments  Pt received in supine, agreeable to therapy session after premedication, pt making progress toward goals and able to initiate bed to chair transfer training this date. Pt used RW given severe back pain and needing up to minA for STS and +2 for safety pivoting to chair due to high pain level. No acute s/sx distress other than c/o pain. Pt agreeable to try sitting in recliner ~1hr at end of session, pt finds sitting upright at 90* too painful to tolerate for more than 1 minute due to spinal pain but tolerates reclined posture much better. Pt continues to benefit from PT services to progress toward functional mobility goals.     If plan is discharge home, recommend the following: A lot of help with walking and/or transfers;A lot of help with bathing/dressing/bathroom;Assistance with cooking/housework;Assist for transportation;Help with stairs or ramp for entrance   Can travel by private vehicle     No  Equipment Recommendations  BSC/3in1;Other (comment) (likely to also need RW, pending progress, TBD by SNF)    Recommendations for Other Services       Precautions / Restrictions Precautions Precautions: Fall Precaution Comments: multiple areas of bony metastases, RUE pain since surgery in may Restrictions Weight Bearing Restrictions: No     Mobility  Bed Mobility Overal bed mobility: Needs Assistance Bed Mobility: Rolling, Sidelying to Sit Rolling: Supervision Sidelying to sit: Supervision       General bed mobility comments: good initiation, use of  bed rail to sit up    Transfers Overall transfer level: Needs assistance Equipment used: Rolling walker (2 wheels) Transfers: Sit to/from Stand, Bed to chair/wheelchair/BSC Sit to Stand: Min assist, +2 safety/equipment   Step pivot transfers: Min assist       General transfer comment: ~56ft toward chair on her R side, pt reliant on RW support due to pain, cues for safe UE placement    Ambulation/Gait                   Stairs             Wheelchair Mobility     Tilt Bed    Modified Rankin (Stroke Patients Only)       Balance Overall balance assessment: Needs assistance Sitting-balance support: No upper extremity supported Sitting balance-Leahy Scale: Fair Sitting balance - Comments: frequently using BUE due to back pain   Standing balance support: Bilateral upper extremity supported, During functional activity Standing balance-Leahy Scale: Fair Standing balance comment: with RW fair; poor unsupported due to back pain                            Cognition Arousal: Alert Behavior During Therapy: WFL for tasks assessed/performed Overall Cognitive Status: Within Functional Limits for tasks assessed                                          Exercises      General  Comments General comments (skin integrity, edema, etc.): no acute s/sx distress, pt reports reclined in chair is more comfortable than sitting at 90* angle; pillows for comfort to her back/bottom; friend in room to visit      Pertinent Vitals/Pain Pain Assessment Pain Assessment: Faces Faces Pain Scale: Hurts even more Pain Location: back and RUE Pain Descriptors / Indicators: Aching, Discomfort, Grimacing, Guarding Pain Intervention(s): Limited activity within patient's tolerance, Monitored during session, Premedicated before session, Repositioned    Home Living                          Prior Function            PT Goals (current goals can now  be found in the care plan section) Acute Rehab PT Goals Patient Stated Goal: to return home once pain better controlled PT Goal Formulation: With patient Time For Goal Achievement: 05/18/23 Progress towards PT goals: Progressing toward goals    Frequency    Min 1X/week      PT Plan      Co-evaluation              AM-PAC PT "6 Clicks" Mobility   Outcome Measure  Help needed turning from your back to your side while in a flat bed without using bedrails?: None Help needed moving from lying on your back to sitting on the side of a flat bed without using bedrails?: A Little Help needed moving to and from a bed to a chair (including a wheelchair)?: A Little Help needed standing up from a chair using your arms (e.g., wheelchair or bedside chair)?: A Little Help needed to walk in hospital room?: Total Help needed climbing 3-5 steps with a railing? : Total 6 Click Score: 15    End of Session Equipment Utilized During Treatment: Gait belt Activity Tolerance: Patient tolerated treatment well;Patient limited by pain Patient left: in chair;with call bell/phone within reach;with family/visitor present (friend in room, OK to leave off chair alarm per RN pt A&O) Nurse Communication: Mobility status;Patient requests pain meds PT Visit Diagnosis: Other abnormalities of gait and mobility (R26.89);Pain Pain - Right/Left: Right Pain - part of body: Arm (+ back)     Time: 3235-5732 PT Time Calculation (min) (ACUTE ONLY): 21 min  Charges:    $Therapeutic Activity: 8-22 mins PT General Charges $$ ACUTE PT VISIT: 1 Visit                     Ernie Sagrero P., PTA Acute Rehabilitation Services Secure Chat Preferred 9a-5:30pm Office: [phone]    Dorathy Kinsman [facility] 05/05/2023, 6:40 PM

## 2023-05-05 NOTE — Progress Notes (Signed)
Transition of Care [facility]) - Inpatient Brief Assessment   Patient Details  Name: Kaitlin Baker MRN: [id number] Date of Birth: Aug 29, 1956  Transition of [facility]) CM/SW Contact:    Janae Bridgeman, RN Phone Number: 05/05/2023, 3:02 PM   Clinical Narrative: CM met with the patient at the bedside to discuss TOC needs.  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  The patient normally lives independently in a large 32 foot camper at [address], [place], [place] [address].  The patient is agreeable to SNF work up in the [place], [place] area.  The patient has oncologist in [place], [place] and plans to take her PO Cancer medication with her to the SNF facility.  Patient does not have OUtpatient cancer treatments at this time.  Patient was provided with Medicare choice regarding OUtpatient Palliative care choice and the patient chose [facility]. I placed the Outpatient referral.  Patient plans to admit to the SNF for short term rehab and work up will be started today and bed offers to be provided by [facility] MSW.    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

## 2023-05-05 NOTE — Evaluation (Signed)
Occupational Therapy Evaluation Patient Details Name: Kaitlin Baker MRN: [id number] DOB: [birth year] Today's Date: 05/05/2023   History of Present Illness 67 y.o. female presents to [facility] hospital on 05/04/2023 with unbearable L back pain related to known bony metastases. PMH includes L breast CA with bony mets to spine, ribs, hips, HLD, OA.   Clinical Impression   Pt admitted for above, currently very limited by pain and not tolerating EOB sitting to complete ADLs. Pt seems very anxious and anticipatory of pain as well. She currently needs max A for LB ADLs at bed level due to sitting intolerance, not able to assess standing balance as patient declined due to the pain. Educated pt on exercises (see below) to maintain strength and joint ROM. Pt would benefit from UE strengthening HEP as well, mainly for LUE due to R deficits. OT to continue to follow pt acutely to help progress and transition to next level of care. Patient would benefit from post acute skilled rehab facility with <3 hours of therapy and 24/7 support        If plan is discharge home, recommend the following: A little help with walking and/or transfers;A lot of help with bathing/dressing/bathroom;Assistance with cooking/housework;Help with stairs or ramp for entrance;Assist for transportation    Functional Status Assessment  Patient has had a recent decline in their functional status and demonstrates the ability to make significant improvements in function in a reasonable and predictable amount of time.  Equipment Recommendations  Other (comment) (defer to next level of care)    Recommendations for Other Services       Precautions / Restrictions Precautions Precautions: Fall Precaution Comments: multiple areas of bony metastases Restrictions Weight Bearing Restrictions: No      Mobility Bed Mobility Overal bed mobility: Needs Assistance Bed Mobility: Rolling, Sidelying to Sit, Sit to Supine Rolling:  Supervision Sidelying to sit: Supervision   Sit to supine: Mod assist   General bed mobility comments: Mod A to assist with BLEs following onset of pain    Transfers                   General transfer comment: Pt declined attempted due to pain, reports standing will only worsen it      Balance Overall balance assessment: Needs assistance Sitting-balance support: No upper extremity supported Sitting balance-Leahy Scale: Fair                                     ADL either performed or assessed with clinical judgement   ADL   Eating/Feeding: Independent;Bed level   Grooming: Sitting;Contact guard assist;Wash/dry face Grooming Details (indicate cue type and reason): very limited in time due to pain in sitting     Lower Body Bathing: Maximal assistance;Bed level       Lower Body Dressing: Bed level;Maximal assistance                 General ADL Comments: Pt very limited by pain in sitting, not able to tolerate 2 mins of sitting but could also have anticipated increased pain from prior experience with PT     Vision         Perception         Praxis         Pertinent Vitals/Pain Pain Assessment Pain Assessment: 0-10 Pain Score: 4  Pain Location: L side of abdomen and back Pain Descriptors / Indicators:  Aching Pain Intervention(s): Limited activity within patient's tolerance, Monitored during session, Repositioned     Extremity/Trunk Assessment Upper Extremity Assessment Upper Extremity Assessment: Right hand dominant;LUE deficits/detail;RUE deficits/detail RUE Deficits / Details: pt with impairment in shoulder strength, grossly 3-/5, elbow distally 4-/5. RUE: Unable to fully assess due to pain;Shoulder pain with ROM LUE Deficits / Details: ROM WFL, 3+/5 shoulder strength but fearful of pain in back and L side   Lower Extremity Assessment Lower Extremity Assessment: Generalized weakness   Cervical / Trunk Assessment Cervical /  Trunk Assessment: Normal   Communication Communication Communication: No apparent difficulties   Cognition Arousal: Alert Behavior During Therapy: WFL for tasks assessed/performed Overall Cognitive Status: Within Functional Limits for tasks assessed                                       General Comments  Pt anxious of pain but continues to make efforts to push herself when able    Exercises General Exercises - Lower Extremity Ankle Circles/Pumps: AROM, 5 reps, Both Quad Sets: AROM, 10 reps, Both, Supine Straight Leg Raises: AROM, 10 reps, Supine, Both   Shoulder Instructions      Home Living Family/patient expects to be discharged to:: Private residence Living Arrangements: Alone Available Help at Discharge: Friend(s);Neighbor;Available PRN/intermittently (closest reliable help 25 min away) 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) Home Access: Stairs to enter Entrance Stairs-Number of Steps: 4 Entrance Stairs-Rails: Left Home Layout: One level     Bathroom Shower/Tub: Walk-in shower (1/2 step to get in shower)   Bathroom Toilet: Standard (close to wall) Bathroom Accessibility: No   Home Equipment: Other (comment) Soil scientist)          Prior Functioning/Environment Prior Level of Function : Independent/Modified Independent             Mobility Comments: ambulatory without DME ADLs Comments: ind, wears slip ons        OT Problem List: Decreased strength;Pain      OT Treatment/Interventions: Self-care/ADL training;Therapeutic exercise;DME and/or AE instruction;Patient/family education;Balance training;Therapeutic activities    OT Goals(Current goals can be found in the care plan section) Acute Rehab OT Goals Patient Stated Goal: To get better OT Goal Formulation: With patient Time For Goal Achievement: 05/19/23 Potential to Achieve Goals: Good ADL Goals Pt Will Perform Grooming: sitting;with supervision Pt Will Perform Lower  Body Bathing: with supervision;sitting/lateral leans;with adaptive equipment Pt Will Perform Lower Body Dressing: with supervision;sitting/lateral leans;with adaptive equipment Pt/caregiver will Perform Home Exercise Program: Increased strength;Left upper extremity (and BLEs) Additional ADL Goal #2: Pt will tolerate 5 mins sitting EOB in preparation for bedside ADL tasks  OT Frequency: Min 1X/week    Co-evaluation              AM-PAC OT "6 Clicks" Daily Activity     Outcome Measure Help from another person eating meals?: None Help from another person taking care of personal grooming?: A Little Help from another person toileting, which includes using toliet, bedpan, or urinal?: A Lot Help from another person bathing (including washing, rinsing, drying)?: A Lot Help from another person to put on and taking off regular upper body clothing?: None Help from another person to put on and taking off regular lower body clothing?: A Lot 6 Click Score: 17   End of Session Nurse Communication: Mobility status  Activity Tolerance: Patient limited by pain Patient left: in bed;with call  bell/phone within reach  OT Visit Diagnosis: Muscle weakness (generalized) (M62.81);Pain Pain - part of body:  (back)                Time: 1030-1101 OT Time Calculation (min): 31 min Charges:  OT General Charges $OT Visit: 1 Visit OT Evaluation $OT Eval Moderate Complexity: 1 Mod OT Treatments $Therapeutic Activity: 8-22 mins  05/05/2023  AB, OTR/L  Acute Rehabilitation Services  Office: [phone]   Tristan Schroeder 05/05/2023, 11:38 AM

## 2023-05-05 NOTE — Plan of Care (Signed)
  Problem: Health Behavior/Discharge Planning: Goal: Ability to manage health-related needs will improve Outcome: Progressing   

## 2023-05-05 NOTE — Progress Notes (Signed)
Prior-To-Admission Oral Chemotherapy for Treatment of Oncologic Disease   Order noted from Oncologist, Dr. [name] to continue prior-to-admission oral chemotherapy regimen of Orserdu (elacestrant) 345 mg Take one (1) tablet by mouth once daily. Take with food.   Elicia Lamp, PharmD, CPP 05/05/2023, 2:41 PM

## 2023-05-05 NOTE — Consult Note (Signed)
Consultation Note Date: 05/05/2023   Patient Name: Kaitlin Baker  DOB: December 09, 1955  MRN: [id number]  Age / Sex: 67 y.o., female  PCP: Default, Provider, MD Referring Physician: Ginnie Smart, MD  Reason for Consultation: Pain control  HPI/Patient Profile: 67 y.o. female  with past medical history of breast cancer diagnosed in 2021 with metastases to the spine, ribs, and hips hyperlipidemia, arthritis, right lower container cuff surgery, admitted on 05/04/2023 with worsening bone pain in her left lower back and lower ribs, left flank.   Patient was admitted for pain management. PMT has been consulted to assist with acute cancer related pain.  Clinical Assessment and Goals of Care:  I have reviewed medical records including EPIC notes, labs and imaging, discussed with RN, assessed the patient and then met at the bedside with patient and her son Kaitlin Baker to discuss diagnosis prognosis, GOC, EOL wishes, disposition and options.  I introduced Palliative Medicine as specialized medical care for people living with serious illness. It focuses on providing relief from the symptoms and stress of a serious illness. The goal is to improve quality of life for both the patient and the family.  We discussed a brief life review of the patient and then focused on their current illness.  The natural disease trajectory and expectations at EOL were discussed.  I attempted to elicit values and goals of care important to the patient.    Medical History Review and Understanding:  Patient shares that her oncologist has not discussed her prognosis in great detail, although she asked during her last visit.  We discussed that stage IV metastatic cancer is often associated with a prognosis of 6 months or less.  Social History: Patient has 1 son and 1 daughter.  She is divorced.  Her daughter is currently out of the country but will be  returning tomorrow.  She has been living at home alone and her son reports that she is very hesitant to ask for help.  She is retired.  She enjoys spending time with her pets.  She previously enjoyed cooking.  Functional and Nutritional State: Patient has had decline in her functioning since May of this year.  She can only do about 1 out of 10 of every task she needs to do due to her pain.  She has not been able to cook for herself anymore during this time and only eats easy to prepare meals.  She has decided to stop driving as this is too painful for her.  Palliative Symptoms: Pain worst in her right arm.  Baseline pain level has been 1-3/10 for the past 3 years but acutely worsened this week.  Pain is triggered by any physical activity.  Advance Directives: Reviewed advanced directives available on file in [place]. 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  Living will has been completed as well.  Discussion: Patient's quality of life has drastically declined since May 10, when she had her surgery on her humerus.  Pain has greatly increased since then with decreasing her functional status.  She has discussed this with her oncologist who has stated is more likely related to the orthopedic surgery than her cancer, but she has a feeling that it is related to her cancer as well.  Percocet has worked for many years but unfortunately not since his acute worsening.  Her goal is to be able to at least bathe herself and enjoy spending time with her animals without pain.  Pain has  been well-controlled since being admitted with frequent check ins by nursing staff.  We reviewed her opioid usage over the past 24 hours and the benefit of initiating long-acting medications, as well as using Tylenol and oxycodone separately rather than combined in Percocet.  We discussed the benefit of alternative pain management modalities and she has found great relief from lidocaine patches, IcyHot.   Recommendation made for consideration of outpatient physical medicine and rehabilitation clinic for additional treatment options.  Patient is appreciative and agreeable.  She would like a little more time with her family with the best quality of life that she can have.   Discussed the importance of continued conversation with family and the medical providers regarding overall plan of care and treatment options, ensuring decisions are within the context of the patient's values and GOCs.   Questions and concerns were addressed.  The family was encouraged to call with questions or concerns.  PMT will continue to support holistically.   SUMMARY OF RECOMMENDATIONS   -Continue DNR/DNI -Continue current care -OxyContin 15 mg twice daily with oxycodone 5 mg every 4 hours as needed for breakthrough pain -Tylenol 1000 mg 3 times daily -Continue Lidoderm patch, IcyHot -Placed ambulatory referral to PM&R clinic for alternative pain management modalities -TOC consulted for outpatient palliative care referral -Goal is for SNF with palliative care f/u.  Patient is accepting of her prognosis but wishes for a little more time -Psychosocial and emotional support provided -PMT will continue to follow and support  Prognosis:  Months to a year  Discharge Planning: Skilled Nursing Facility for rehab with Palliative care service follow-up      Primary Diagnoses: Present on Admission:  Primary breast cancer with metastasis to other site [facility])  Carcinoma of breast metastatic to bone [facility])   Physical Exam Vitals and nursing note reviewed.  Cardiovascular:     Rate and Rhythm: Normal rate.  Pulmonary:     Effort: Pulmonary effort is normal. No respiratory distress.  Skin:    General: Skin is warm and dry.  Neurological:     Mental Status: She is alert and oriented to person, place, and time.  Psychiatric:        Mood and Affect: Mood normal.        Behavior: Behavior normal.     Vital Signs:  BP (!) 143/71 (BP Location: Left Arm)   Pulse 74   Temp 98.4 F (36.9 C)   Resp 19   SpO2 98%  Pain Scale: 0-10   Pain Score: 5    SpO2: SpO2: 98 % O2 Device:SpO2: 98 % O2 Flow Rate: .    Palliative Assessment/Data: 50%      Total time: I spent 80 minutes in the care of the patient today in the above activities and documenting the encounter.  MDM: High    Legaci Tarman Jeni Salles, PA-C  Palliative Medicine Team Team phone # [phone]  Thank you for allowing the Palliative Medicine Team to assist in the care of this patient. Please utilize secure chat with additional questions, if there is no response within 30 minutes please call the above phone number.  Palliative Medicine Team providers are available by phone from 7am to 7pm daily and can be reached through the team cell phone.  Should this patient require assistance outside of these hours, please call the patient's attending physician.

## 2023-05-05 NOTE — Plan of Care (Signed)
Patient alert/oriented X4. Patient compliant with medication administration and dilaudid given as needed for increased pain. Patient lidocaine patch applied to lower back. Patient pain has improved since yesterday. VSS. Will continue to monitor.   Problem: Education: Goal: Knowledge of General Education information will improve Description: Including pain rating scale, medication(s)/side effects and non-pharmacologic comfort measures Outcome: Progressing   Problem: Health Behavior/Discharge Planning: Goal: Ability to manage health-related needs will improve Outcome: Progressing   Problem: Clinical Measurements: Goal: Ability to maintain clinical measurements within normal limits will improve Outcome: Progressing   Problem: Clinical Measurements: Goal: Will remain free from infection Outcome: Progressing   Problem: Clinical Measurements: Goal: Diagnostic test results will improve Outcome: Progressing   Problem: Clinical Measurements: Goal: Respiratory complications will improve Outcome: Progressing   Problem: Clinical Measurements: Goal: Cardiovascular complication will be avoided Outcome: Progressing   Problem: Activity: Goal: Risk for activity intolerance will decrease Outcome: Progressing   Problem: Nutrition: Goal: Adequate nutrition will be maintained Outcome: Progressing   Problem: Coping: Goal: Level of anxiety will decrease Outcome: Progressing   Problem: Elimination: Goal: Will not experience complications related to bowel motility Outcome: Progressing   Problem: Elimination: Goal: Will not experience complications related to urinary retention Outcome: Progressing   Problem: Pain Managment: Goal: General experience of comfort will improve Outcome: Progressing   Problem: Safety: Goal: Ability to remain free from injury will improve Outcome: Progressing   Problem: Skin Integrity: Goal: Risk for impaired skin integrity will decrease Outcome:  Progressing

## 2023-05-05 NOTE — [place] FL2 (Signed)
[place] MEDICAID FL2 LEVEL OF CARE FORM     IDENTIFICATION  Patient Name: Kaitlin Baker Birthdate: 1955-12-09 Sex: female Admission Date (Current Location): 05/04/2023  [facility] and [place] Number:  Producer, television/film/video and Address:  [facility] [place] [address], [place], [place] [address]      Provider Number: [id number]  Attending Physician Name and Address:  Ginnie Smart, MD  Relative Name and Phone Number:  [name], daughter - [phone]    Current Level of Care: Hospital Recommended Level of Care: Skilled Nursing Facility Prior Approval Number:    Date Approved/Denied:   PASRR Number: [id number]  Discharge Plan: SNF    Current Diagnoses: Patient Active Problem List   Diagnosis Date Noted   Cancer related pain 05/05/2023   Primary breast cancer with metastasis to other site [facility]) 05/04/2023   Drug-induced constipation 05/04/2023   Genetic testing 02/04/2021   Family history of breast cancer    Malignant neoplasm metastatic to bone (HCC) 07/17/2020   Metastatic breast cancer 06/17/2020    Orientation RESPIRATION BLADDER Height & Weight     Self, Time, Situation, Place  Normal External catheter Weight:   Height:     BEHAVIORAL SYMPTOMS/MOOD NEUROLOGICAL BOWEL NUTRITION STATUS      Continent Diet (See Discharge Summary)  AMBULATORY STATUS COMMUNICATION OF NEEDS Skin   Total Care Verbally Normal                       Personal Care Assistance Level of Assistance  Bathing, Feeding, Dressing, Total care Bathing Assistance: Limited assistance Feeding assistance: Independent Dressing Assistance: Maximum assistance     Functional Limitations Info  Sight, Hearing, Speech Sight Info: Impaired Hearing Info: Adequate Speech Info: Adequate    SPECIAL CARE FACTORS FREQUENCY  PT (By licensed PT), OT (By licensed OT)     PT Frequency: 5 x per week OT Frequency: 5 x per week            Contractures  Contractures Info: Not present    Additional Factors Info  Code Status, Allergies Code Status Info: DNR Allergies Info: Codeine           Current Medications (05/05/2023):  This is the current hospital active medication list Current Facility-Administered Medications  Medication Dose Route Frequency Provider Last Rate Last Admin   acetaminophen (TYLENOL) tablet 1,000 mg  1,000 mg Oral TID Excell Seltzer, Josseline P, PA-C   1,000 mg at 05/05/23 1348   capsaicin (ZOSTRIX) 0.025 % cream   Topical BID PRN Nooruddin, Jason Fila, MD       Melene Muller ON 05/06/2023] elacestrant (Orserdu) tablet 345 mg - patient's own supply  345 mg Oral Q breakfast [name], MD       HYDROmorphone (DILAUDID) injection 0.5 mg  0.5 mg Intravenous Q4H PRN Nooruddin, Saad, MD   0.5 mg at 05/05/23 0943   lidocaine (LIDODERM) 5 % 1 patch  1 patch Transdermal Q24H Nooruddin, Saad, MD   1 patch at 05/04/23 1542   ondansetron (ZOFRAN) injection 4 mg  4 mg Intravenous Once Masters, Katie, DO       oxyCODONE (Oxy IR/ROXICODONE) immediate release tablet 5 mg  5 mg Oral Q4H PRN Cooper, Josseline P, PA-C       oxyCODONE (OXYCONTIN) 12 hr tablet 15 mg  15 mg Oral Q12H Cooper, Josseline P, PA-C   15 mg at 05/05/23 1234   polyethylene glycol (MIRALAX / GLYCOLAX) packet 17 g  17 g Oral Daily Nooruddin, Saad, MD   17 g at 05/05/23 0943   senna (SENOKOT) tablet 8.6 mg  1 tablet Oral Daily Nooruddin, Saad, MD   8.6 mg at 05/05/23 5784     Discharge Medications: Please see discharge summary for a list of discharge medications.  Relevant Imaging Results:  Relevant Lab Results:   Additional Information Patient to bring her own supply of Oserdu 345 mg po daily from home, SS# [id number]  Janae Bridgeman, RN

## 2023-05-05 NOTE — Progress Notes (Signed)
 [facility] Liaison Note  Notified by [facility] manager of patient/family request for [facility] palliative services at home after discharge.   [facility] hospital liaison will follow patient for discharge disposition.   Please call with any hospice or outpatient palliative care related questions.   Thank you for the opportunity to participate in this patient's care.   Dionicio Stall, [facility] Liaison [phone]

## 2023-05-05 NOTE — Progress Notes (Signed)
Pharmacist rounding with the IMTS-B1/[facility] Service. The patient's medical oncologist, Dr. [name] has given a telephonic order to continue the patient's oral chemotherapy drug, Orserdu (elacestrant) tablets, 345 milligram, by mouth, once-daily.

## 2023-05-05 NOTE — Progress Notes (Signed)
Subjective:   Summary: Kaitlin Baker is a 67 y.o. year old female currently admitted on the IMTS HD#1 for break through cancer pain.  Overnight Events:  Patient states her pain was well controlled and at its baseline of 3-4 out of 10 intensity and 3 is her baseline. She was receiving her PRN meds scheduled with Oxycodone 10mg  Q6h and Dilaudid 0.5mg  Q4h. She states she was able to sit up for 5 min but felt pain along left anterior chest wall prompting her to lay back in bed. She states she worked with PT yesterday and did well and didn't have any episodes of breakthrough pain similar to what brought her in to the ED. She states the lidoderm patch helped significantly but had to remove it since it can only stay on for 12 hours at a time. She plans on talking to palliative today for further recommendations and goals of care discussions.  Denies any bowel movement since yesterday despite miralax and senna states she feels have one today. Wants to be able to go home/rehab to her dog Kaitlin Baker and two cats Kaitlin Baker and Kaitlin Baker if possible.   Objective:  Vital signs in last 24 hours: Vitals:   05/04/23 1530 05/04/23 1913 05/05/23 0433 05/05/23 0717  BP: 120/70 119/71 136/70 (!) 143/71  Pulse: 79 100 72 74  Resp:  17 17 19   Temp: 98.3 F (36.8 C) 99.5 F (37.5 C) 98.1 F (36.7 C) 98.4 F (36.9 C)  TempSrc: Oral  Oral   SpO2: 96% 96% 98% 98%   Supplemental O2: Room Air SpO2: 98 %   Physical Exam:  Constitutional: well-appearing female sitting in bed, in no acute distress Cardiovascular: RRR, no murmurs, rubs or gallops Pulmonary/Chest: normal work of breathing on room air, lungs clear to auscultation bilaterally Abdominal: soft, non-tender, non-distended Skin: warm and dry Extremities: upper/lower extremity pulses 2+, no lower extremity edema present MSK: Minimal tenderness to the left posterior rib.  There were no vitals filed for this visit.   Intake/Output Summary (Last  24 hours) at 05/05/2023 0927 Last data filed at 05/04/2023 2043 Gross per 24 hour  Intake 200 ml  Output 300 ml  Net -100 ml   Net IO Since Admission: -100 mL [05/05/23 0927]  Pertinent Labs:    Latest Ref Rng & Units 05/05/2023    3:12 AM 05/04/2023   10:13 AM 05/03/2023    9:28 AM  CBC  WBC 4.0 - 10.5 K/uL 7.9  8.1  7.6   Hemoglobin 12.0 - 15.0 g/dL 53.6  64.4  03.4   Hematocrit 36.0 - 46.0 % 35.0  38.7  37.2   Platelets 150 - 400 K/uL 321  326  320        Latest Ref Rng & Units 05/05/2023    3:12 AM 05/04/2023   10:13 AM 05/03/2023    9:28 AM  CMP  Glucose 70 - 99 mg/dL 91  742  92   BUN 8 - 23 mg/dL 10  13  22    Creatinine 0.44 - 1.00 mg/dL 5.95  6.38  7.56   Sodium 135 - 145 mmol/L 139  140  139   Potassium 3.5 - 5.1 mmol/L 3.4  3.5  4.2   Chloride 98 - 111 mmol/L 103  101  102   CO2 22 - 32 mmol/L 26  24  29    Calcium 8.9 - 10.3 mg/dL  9.0  9.5  9.5   Total Protein 6.5 - 8.1 g/dL 6.4  7.4  7.3   Total Bilirubin 0.3 - 1.2 mg/dL 0.3  0.5  0.3   Alkaline Phos 38 - 126 U/L 119  133  133   AST 15 - 41 U/L 17  23  19    ALT 0 - 44 U/L 14  16  17      I personally reviewed interval labs and pertinent results include: CBC, and CMP  Imaging: CT T-SPINE NO CHARGE  Result Date: 05/04/2023 CLINICAL DATA:  67 year old female with back pain. Breast cancer with known skeletal metastases. EXAM: CT THORACIC SPINE WITHOUT CONTRAST TECHNIQUE: Multiplanar CT images of the thoracic spine were reconstructed from contemporary CT of the Chest. RADIATION DOSE REDUCTION: This exam was performed according to the departmental dose-optimization program which includes automated exposure control, adjustment of the mA and/or kV according to patient size and/or use of iterative reconstruction technique. CONTRAST:  None COMPARISON:  CT Chest today are reported separately. PET-CT 01/21/2023. Whole-body bone scan 09/18/2022. FINDINGS: Limited cervical spine imaging: Partially visible cervical ACDF through C7.  Thoracic spine segmentation:  Normal. Alignment: Overall thoracic kyphosis has not significantly changed since January. Vertebrae: Diffuse osseous metastatic disease with mixed lytic and sclerotic lesions at all thoracic vertebral levels. Some hardware artifact from the cervical spine but T1 vertebral body osteolysis appears progressed since January. T2 and T3 appears stable since January, chronic pathologic compression fracture at the latter. T4 anterior and posterior vertebral body osteolysis has progressed since January. T5 appears stable and largely intact. Posterior T6 and T7 osteolysis is only mildly progressed since January. T8 lytic metastases with chronic pathologic compression fracture is largely stable, but posterior body osteolysis has somewhat progressed since January. T9 is stable. T10 posterior vertebral body osteolysis is substantially new since January. T11 mild chronic pathologic compression fracture is stable but osteolysis of the body has progressed. New T12 pathologic compression fracture since January (40% loss of height and chronic but increased vertebral body osteolysis. L1 vertebral body osteolysis has also progressed while mild compression appears stable. Paraspinal and other soft tissues: Paraspinal soft tissue tumor has increased since the January CT at T8, T10, T12. And un uncertain degree of thoracic spinal canal involvement is present at those levels. Other chest findings today reported separately. Disc levels: Cannot exclude malignant thoracic spinal stenosis. IMPRESSION: 1. Diffuse skeletal metastases with progression of thoracic vertebral lytic lesions since January at T1, T4, T10, T11, and T12. And associated new or increased paraspinal/extraosseous extension of tumor at T8, T10, and T12. Epidural tumor infiltration and malignant thoracic spinal stenosis not excluded. Thoracic MRI without and with contrast would best evaluate further. 2. New pathologic compression fracture at T12  since January (40% loss of height). Stable pathologic compression fractures elsewhere. 3. Other Chest findings today reported separately. Electronically Signed   By: Odessa Fleming M.D.   On: 05/04/2023 12:11   CT Chest Wo Contrast  Result Date: 05/04/2023 CLINICAL DATA:  67 year old female with back pain. Breast cancer with known skeletal metastases. EXAM: CT CHEST WITHOUT CONTRAST TECHNIQUE: Multidetector CT imaging of the chest was performed following the standard protocol without IV contrast. RADIATION DOSE REDUCTION: This exam was performed according to the departmental dose-optimization program which includes automated exposure control, adjustment of the mA and/or kV according to patient size and/or use of iterative reconstruction technique. COMPARISON:  PET-CT 01/21/2023, restaging CT Chest, Abdomen, and Pelvis with contrast 09/18/2022. Thoracic spine CT today reported separately.  FINDINGS: Cardiovascular: Calcified coronary artery (series 3, image 79) and Calcified aortic atherosclerosis. Vascular patency is not evaluated in the absence of IV contrast. Borderline cardiomegaly now. No pericardial effusion. Mediastinum/Nodes: Chronic heterogeneous enlargement of the thyroid isthmus appears stable since 2021and negative by PET, In the setting of significant comorbidities or limited life expectancy, no follow-up recommended (ref: J Am Coll Radiol. 2015 Feb;12(2): 143-50). No mediastinal mass or lymphadenopathy is evident in the absence of contrast. Small to moderate gastric hiatal hernia appears stable. Lungs/Pleura: Major airways are patent. Lung volumes are stable since January. Platelike opacity in both lower lobe posterior basal segments now most resembles atelectasis and is superimposed on chronic scarring in the bilateral anterior and lateral basal segments. No pleural effusion. No lung nodule. Upper Abdomen: Surgically absent gallbladder. Negative visible noncontrast liver, spleen, pancreas, adrenal glands,  left kidney, and bowel in the upper abdomen. No free air or free fluid. Musculoskeletal: Diffuse combined lytic and sclerotic skeletal metastases throughout the chest. Bilateral shoulder, bilateral rib, and lytic sternal metastases do not appear significantly changed since May. Thoracic spine is detailed separately. IMPRESSION: 1. Diffuse skeletal metastases. Thoracic spine CT is reported separately today. 2. No lung or mediastinal metastasis identified by noncontrast chest CT. Platelike atelectasis suspected in both lower lobes today, superimposed on areas of chronic lower lobe scarring. 3.  Aortic Atherosclerosis (ICD10-I70.0). Electronically Signed   By: Odessa Fleming M.D.   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   # Severe Cancer Related Pain # Progression of Diffuse Skeletal Metastasis  - Patient endorsed she received her prn pain meds scheduled without waiting for the pain to worsen and endorses feeling at her baseline of 3/10 intensity pain. Received Oxy 10mg  Q6hrs and Dilaudid 0.5mg  Q4hrs.  - Pain controlled on 75 MME plan to convert to equivalent oral dose of scheduled pain meds rather PRN. - Patient endorses significant relief with Lidoderm patch - Continue with Miralax and Senna bowel regimen - Continue with PT   #Palliative/Hospice Referral - Palliative consulted,I appreciate recommendations - Per their recommendations Oxycontin BID, Tylenol TID, and PRN Oxycodone 5mg  Q4H PRN  - SNF with f/u with palliative - PMNR consulted  # Chronic Problems: -Thyroid nodule 3.1 cm -  Low Vitamin D  Diet: Normal IVF: None, VTE: None Code: Unknown   Dispo: Anticipated discharge to Skilled nursing facility in 1 day pending palliative consult for resources, recommendations and further goals of care discussions.   Larrie Kass MS4 (Medical Student) Pager Number [phone] Please contact the on call pager after 5 pm

## 2023-05-06 DIAGNOSIS — C7951 Secondary malignant neoplasm of bone: Secondary | ICD-10-CM | POA: Diagnosis not present

## 2023-05-06 DIAGNOSIS — G893 Neoplasm related pain (acute) (chronic): Secondary | ICD-10-CM | POA: Diagnosis not present

## 2023-05-06 DIAGNOSIS — Z515 Encounter for palliative care: Secondary | ICD-10-CM | POA: Diagnosis not present

## 2023-05-06 DIAGNOSIS — C50919 Malignant neoplasm of unspecified site of unspecified female breast: Secondary | ICD-10-CM | POA: Diagnosis not present

## 2023-05-06 MED ORDER — SENNA 8.6 MG PO TABS
1.0000 | ORAL_TABLET | Freq: Two times a day (BID) | ORAL | Status: DC
  Administered 2023-05-06 – 2023-05-07 (×3): 8.6 mg via ORAL
  Filled 2023-05-06 (×3): qty 1

## 2023-05-06 MED ORDER — POLYETHYLENE GLYCOL 3350 17 G PO PACK
17.0000 g | PACK | Freq: Two times a day (BID) | ORAL | Status: DC
  Administered 2023-05-06 – 2023-05-07 (×3): 17 g via ORAL
  Filled 2023-05-06 (×3): qty 1

## 2023-05-06 NOTE — Progress Notes (Signed)
Subjective:   Summary: Kaitlin Baker is a 67 y.o. year old female currently admitted on the IMTS HD#2 for Cancer pain management.  Overnight Events:  No significant overnight events.She states she spoke with palliative and has had pain meds changed. Reports no pain overnight and she was able to ambulate without significant pain. Denies a bowel movement yet since admission. She is still working with PT and spoke with hospice. She is waiting for a SNF bed placement and rehab with them. She denies any new symptoms and reports feeling back to baseline.   Objective:  Vital signs in last 24 hours: Vitals:   05/05/23 1619 05/05/23 1943 05/06/23 0411 05/06/23 0851  BP: 130/79 134/79 122/69 123/76  Pulse: 82 79 81 90  Resp: 19 17 18 18   Temp: 98.4 F (36.9 C) 97.9 F (36.6 C) 98.3 F (36.8 C) 97.9 F (36.6 C)  TempSrc: Oral     SpO2: 96% 97% 98% 100%   Supplemental O2: Bag Valve Mask SpO2: 100 %   Physical Exam:  Constitutional: well-appearing female sitting in bed, in no acute distress Cardiovascular: RRR, no murmurs, rubs or gallops Pulmonary/Chest: normal work of breathing on room air, lungs clear to auscultation bilaterally Abdominal: soft, non-tender, non-distended Skin: warm and dry Extremities: upper/lower extremity pulses 2+, no lower extremity edema present  There were no vitals filed for this visit.   Intake/Output Summary (Last 24 hours) at 05/06/2023 1147 Last data filed at 05/06/2023 0500 Gross per 24 hour  Intake 100 ml  Output 800 ml  Net -700 ml   Net IO Since Admission: -800 mL [05/06/23 1147]  Pertinent Labs:    Latest Ref Rng & Units 05/05/2023    3:12 AM 05/04/2023   10:13 AM 05/03/2023    9:28 AM  CBC  WBC 4.0 - 10.5 K/uL 7.9  8.1  7.6   Hemoglobin 12.0 - 15.0 g/dL 78.2  95.6  21.3   Hematocrit 36.0 - 46.0 % 35.0  38.7  37.2   Platelets 150 - 400 K/uL 321  326  320        Latest Ref Rng & Units 05/05/2023    3:12 AM 05/04/2023    10:13 AM 05/03/2023    9:28 AM  CMP  Glucose 70 - 99 mg/dL 91  086  92   BUN 8 - 23 mg/dL 10  13  22    Creatinine 0.44 - 1.00 mg/dL 5.78  4.69  6.29   Sodium 135 - 145 mmol/L 139  140  139   Potassium 3.5 - 5.1 mmol/L 3.4  3.5  4.2   Chloride 98 - 111 mmol/L 103  101  102   CO2 22 - 32 mmol/L 26  24  29    Calcium 8.9 - 10.3 mg/dL 9.0  9.5  9.5   Total Protein 6.5 - 8.1 g/dL 6.4  7.4  7.3   Total Bilirubin 0.3 - 1.2 mg/dL 0.3  0.5  0.3   Alkaline Phos 38 - 126 U/L 119  133  133   AST 15 - 41 U/L 17  23  19    ALT 0 - 44 U/L 14  16  17      Imaging: No results found. I personally reviewed interval imaging and my interpretation is as follows: no new images  EKG: My EKG interpretation is as follows: No new ekg  Assessment/Plan:   Principal  Problem:   Primary breast cancer with metastasis to other site [facility]) Active Problems:   Malignant neoplasm metastatic to bone (HCC)   Drug-induced constipation   Cancer related pain   Patient Summary: Kaitlin Baker is a 67 y.o. with a pertinent PMH of Kaitlin Baker is a 67 y.o. 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   # Severe Cancer Related Pain # Progression of Diffuse Skeletal Metastasis  Patient was happy and reported significant improvement in pain and was able to ambulate.  - Continue with Oxy 15mg  bid and oxy 5mg  q4h prn  - Continue with icy/hot and lidoderm patch  - Continue with miralax and senna if no bowel movement may need docusate enema. - Consider high dose NSAID therapy with ibuprofen 600mg  q12 or q8hr (not to exceed 3200mg  in a day) for additional cancer pain relief.  # Palliative/Hospice Referral  Patient saw palliative and hospice already and agreeable to their plans - PMNR outpatient  -  Social working with patient for bed - Dispo pending SNF bed placement  - bed likely tomorrow or later today  Diet: Tube Feeds IVF: D10,Bolus VTE: None Code: Unknown PT/OT recs: Home Health, none. TOC recs: palliative and pmnr in the SNF setting Family Update:    Dispo: Anticipated discharge to Skilled nursing facility in 1 days pending bed placement.   Larrie Kass, MS4 Medical Student Pager Number [phone] Please contact the on call pager after 5 pm and on weekends at [phone].

## 2023-05-06 NOTE — Plan of Care (Signed)
Problem: Education: Goal: Knowledge of General Education information will improve Description: Including pain rating scale, medication(s)/side effects and non-pharmacologic comfort measures Outcome: Progressing Pt understands she was admitted into the hospital for left lower rib cage/ left lower back pain x6 days r/t her breast cancer that has metastasize to her bones.  She is receiving schedule pain medications per MD's orders to manage her pain.   Problem: Clinical Measurements: Goal: Ability to maintain clinical measurements within normal limits will improve Outcome: Progressing VS WNL thus far.   Problem: Clinical Measurements: Goal: Will remain free from infection Outcome: Progressing S/Sx of infection monitored and assessed q-shift.  Pt has remained afebrile thus far.    Problem: Clinical Measurements: Goal: Respiratory complications will improve Outcome: Progressing Respiratory status monitored and assessed q-shift.  Pt is on room air with PO2 saturations at 100% and respiration rate of 18 breaths per minute.  She has not endorsed c/o SOE or DOE.    Problem: Clinical Measurements: Goal: Cardiovascular complication will be avoided Outcome: Progressing Pt's HR and BP have been WNL.    She has not endorsed c/o SOE/ DOE or CP/ palpation.    Problem: Activity: Goal: Risk for activity intolerance will decrease Outcome: Progressing Pt has been OOB to the bathroom with the assist of RN staff several times utilizing a FWW.  PT came and evaluated pt.  She was OOB up to the chair for 2 hours today.  NT, Maciah, ambulated pt in the hallways 224ft utilizing a FWW.  She was observed walking with a steady gait utilizing a FWW.        Problem: Nutrition: Goal: Adequate nutrition will be maintained Outcome: Progressing Pt is on regular diet per MD's orders.  She has been able to eat 50-90% of her meals thus far.  Pt has not endorsed c/o n/v or abdominal pain/ distention thus far.    Problem: Elimination: Goal: Will not experience complications related to bowel motility Outcome: Progressing Pt has endorsed c/o constipation.  MD started pt on a bowel regiment.  She has been OOB to the bathroom with the assist of RN staff several times utilizing a FWW.   Pt has attempted several times to have a BM today.  In efforts to have a BM, she ambulated in the hallways 242ft utilizing a FWW with NT, Maciah, thinking that will assist her in having a BM.   Problem: Elimination: Goal: Will not experience complications related to urinary retention Outcome: Progressing Pt has not endorsed dysuria, abdominal pain/ distention thus far.  She has an external female catheter (purewick) in place to capture her urine.  Management of external female catheter (purewick) performed per [facility] policy, procedure and guidelines.   Problem: Pain Managment: Goal: General experience of comfort will improve Outcome: Progressing Pt has endorsed c/o 2-8/10 left back pain describing it as a constant sharp ache.  She was admitted into the hospital for left lower rib cage/ left lower back pain x6 days r/t her breast cancer that has metastasize to her bones.  Reiterated pain scale so she could adequately rate her pain.  Pt stated her pain goal this admission would be 2/10.  Discussed nonpharmacological methods to help reduce s/sx of pain.  Interventions given per pt's request and MD's orders.   Problem: Safety: Goal: Ability to remain free from injury will improve Outcome: Progressing Pt has remained free from falls thus far. Instructed pt to utilize RN call light for assistance. Hourly rounds performed. Bed alarm implemented to keep  pt safe from falls. Settings activated to third most sensitive mode. Bed in lowest position, locked with two upper side rails engaged. Belongings and call light within reach.   Problem: Skin Integrity: Goal: Risk for impaired skin integrity will decrease Outcome: Progressing Skin  integrity monitored and assessed q-shift. Pt is on q2 hourly turns to prevent further skin impairment.  Tubes and drains assessed for device related pressure sores.  Pt is continent of both bowel and bladder.  She was admitted into the hospital for left lower rib cage/ left lower back pain x6 days r/t her breast cancer that has metastasize to her bones.  D/t her bone pain pt has an external female catheter (purewick) in place to capture her urine.  Management of external female catheter (purewick) performed per [facility] policy, procedure and guidelines.

## 2023-05-06 NOTE — Progress Notes (Addendum)
Physical Therapy Treatment Patient Details Name: Kaitlin Baker MRN: [id number] DOB: 1956/08/28 Today's Date: 05/06/2023   History of Present Illness 67 y.o. female presents to [facility] hospital on 05/04/2023 with unbearable L back pain related to known bony metastases. PMH includes L breast CA with bony mets to spine, ribs, hips, HLD, OA, 01/22/23 s/p Humeral intramedullary nail with proximal and distal  locking screw fixation.    PT Comments  Pt received in supine, agreeable to therapy session after premedication with IV pain meds (PO pain meds were not sufficiently controlling pain for OOB mobility) and with good participation and fair tolerance for transfer OOB to chair. Pt encouraged to attempt short gait trial in room however she reports pain went up too high in AM after ambulating to bathroom with nursing staff earlier in the day and defers. Pt performed some seated BLE exercises and was given education on benefits of mobility/exercise in reducing cancer-related fatigue. Also reviewed back precs for comfort given spinal mets, handout given to reinforce. "Falls at Home" risk reduction handout also given. Pt continues to benefit from PT services to progress toward functional mobility goals.    If plan is discharge home, recommend the following: A lot of help with walking and/or transfers;A lot of help with bathing/dressing/bathroom;Assistance with cooking/housework;Assist for transportation;Help with stairs or ramp for entrance   Can travel by private vehicle     No  Equipment Recommendations  BSC/3in1;Other (comment) (likely to also need RW, pending progress, TBD by SNF)    Recommendations for Other Services       Precautions / Restrictions Precautions Precautions: Fall Precaution Comments: multiple areas of bony metastases, RUE pain since surgery in may Restrictions Weight Bearing Restrictions: No     Mobility  Bed Mobility Overal bed mobility: Needs Assistance Bed Mobility: Rolling,  Sidelying to Sit Rolling: Supervision Sidelying to sit: Supervision       General bed mobility comments: good initiation, use of bed rail to sit up    Transfers Overall transfer level: Needs assistance Equipment used: Rolling walker (2 wheels) Transfers: Sit to/from Stand, Bed to chair/wheelchair/BSC Sit to Stand: Min assist   Step pivot transfers: Contact guard assist       General transfer comment: ~41ft toward chair on her R side, pt reliant on RW support due to pain, cues for safe UE placement    Ambulation/Gait               General Gait Details: pt defers due to increase in AM pain after ambulating to/from bathroom   Stairs             Wheelchair Mobility     Tilt Bed    Modified Rankin (Stroke Patients Only)       Balance Overall balance assessment: Needs assistance Sitting-balance support: No upper extremity supported Sitting balance-Leahy Scale: Fair Sitting balance - Comments: frequently using BUE due to back pain; tolerates sitting EOB ~2 mins   Standing balance support: Bilateral upper extremity supported, During functional activity Standing balance-Leahy Scale: Fair Standing balance comment: with RW fair; poor unsupported due to back pain                            Cognition Arousal: Alert Behavior During Therapy: WFL for tasks assessed/performed Overall Cognitive Status: Within Functional Limits for tasks assessed  General Comments: Pt with good insight into condition/deficits, able to make her needs known well. PTA provided some education on cancer-related fatigue and benefits of exercise given her pain, pt appreciative and eager to continue attempts at mobilizing within pain tolerance.        Exercises Other Exercises Other Exercises: seated BLE AROM: hip flexion, LAQ x10 reps ea x2 sets (20 total) Other Exercises: pt defers standing exercises/gait progression due to pain  today    General Comments General comments (skin integrity, edema, etc.): discussion on cancer-related fatigue and benefits of continued frequent mobility and supine/seated BLE exercises for strengthening, pt receptive. Pt states it is frustrating that her OP MD and urgent care did not take the pain from her bone metastases seriously.      Pertinent Vitals/Pain Pain Assessment Pain Assessment: Faces Faces Pain Scale: Hurts little more Pain Location: Lflank to central spine and RUE Pain Descriptors / Indicators: Aching, Discomfort, Grimacing, Guarding Pain Intervention(s): Monitored during session, Repositioned, Limited activity within patient's tolerance, Ice applied    Home Living                          Prior Function            PT Goals (current goals can now be found in the care plan section) Acute Rehab PT Goals Patient Stated Goal: to return home once pain better controlled PT Goal Formulation: With patient Time For Goal Achievement: 05/18/23 Progress towards PT goals: Progressing toward goals (slowly due to pain)    Frequency    Min 1X/week      PT Plan      Co-evaluation              AM-PAC PT "6 Clicks" Mobility   Outcome Measure  Help needed turning from your back to your side while in a flat bed without using bedrails?: None Help needed moving from lying on your back to sitting on the side of a flat bed without using bedrails?: A Little Help needed moving to and from a bed to a chair (including a wheelchair)?: A Little Help needed standing up from a chair using your arms (e.g., wheelchair or bedside chair)?: A Little Help needed to walk in hospital room?: Total (<20 ft) Help needed climbing 3-5 steps with a railing? : Total 6 Click Score: 15    End of Session Equipment Utilized During Treatment: Gait belt Activity Tolerance: Patient tolerated treatment well;Patient limited by pain Patient left: in chair;with call bell/phone within  reach;with family/visitor present (friend in room, OK to leave off chair alarm per RN pt A&O) Nurse Communication: Mobility status PT Visit Diagnosis: Other abnormalities of gait and mobility (R26.89);Pain Pain - Right/Left: Right Pain - part of body: Arm (+ back)     Time: 7564-3329 PT Time Calculation (min) (ACUTE ONLY): 24 min  Charges:    $Therapeutic Exercise: 8-22 mins $Therapeutic Activity: 8-22 mins PT General Charges $$ ACUTE PT VISIT: 1 Visit                     Kripa Foskey P., PTA Acute Rehabilitation Services Secure Chat Preferred 9a-5:30pm Office: [phone]    Dorathy Kinsman [facility] 05/06/2023, 12:05 PM

## 2023-05-06 NOTE — Progress Notes (Signed)
Daily Progress Note   Patient Name: Kaitlin Baker       Date: 05/06/2023 DOB: 03-01-1956  Age: 67 y.o. MRN#: [id number] Attending Physician: Ginnie Smart, MD Primary Care Physician: Default, Provider, MD Admit Date: 05/04/2023  Reason for Consultation/Follow-up: Establishing goals of care  Subjective: Medical records reviewed including progress notes, MAR, labs and imaging. Patient assessed at the bedside. She reports markedly improved pain and increased activity level. No family present during my visit.  Created space and opportunity for patient's thoughts and feelings on her current illness. She is grateful to share that she has had a "breakthrough" in her pain and even went 2-3 hours at a 0/10. She has been able to complete bed exercises with mild soreness and walk to the hallway/bathroom with mild increase in pain. She even considering going to her daughter's house rather than SNF, but then reflected that she likely still needs more strengthening first. She wishes she did not have to send her dog to [place] during her STR stay.  We reviewed her usage of PRN medications and I recommended continuing with current medications at discharge. Discussed her questions about oral dilaudid and importance of higher potency options in the future if her pain worsens or she develops tolerance to oxycodone/oxycontin.   Patient has been thinking about the dying process and reflecting on her experience with her brother, who also had cancer, during his final days. We discussed use of opioids for end of life comfort and I provided with Gone From My Sight booklet for additional review and reflection. She remains at peace with her prognosis and her hope is that when her time comes, she does not have any pain.     Questions and concerns addressed. PMT will continue to support holistically.   Length of Stay: 1   Physical Exam Vitals and nursing note reviewed.  Constitutional:      General: She is not in acute distress. Cardiovascular:     Rate and Rhythm: Normal rate.  Pulmonary:     Effort: Pulmonary effort is normal.  Neurological:     Mental Status: She is alert and oriented to person, place, and time.  Psychiatric:        Mood and Affect: Mood normal.        Behavior: Behavior normal.            Vital Signs: BP 122/69 (BP Location: Left Arm)   Pulse 81   Temp 98.3 F (36.8 C)   Resp 18   SpO2 98%  SpO2: SpO2: 98 % O2 Device: O2 Device: Room Air O2 Flow Rate:        Palliative Assessment/Data: 50%-60%    Palliative Care Assessment & Plan   Patient Profile: 67 y.o. female  with past medical history of breast cancer diagnosed in 2021 with metastases to the spine, ribs, and hips hyperlipidemia, arthritis, right lower container cuff surgery, admitted on 05/04/2023 with worsening bone pain in her left lower back and lower ribs, left flank.    Patient was admitted for pain management. PMT has been consulted to assist with acute cancer related pain.  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  Recommendations/Plan: Continue DNR/DNI Continue current care, goal remains discharge to SNF with outpatient palliative care f/u Continue scheduled Oxycontin 15mg  BID and Tylenol 1,000mg  TID with Oxycodone 5mg  Q4H PRN for breakthrough pain/pre-medication before therapy Psychosocial and emotional support provided Referral to PM&R clinic previously placed PMT remains available for support as needed   Prognosis:  Months to a year  Discharge Planning: Skilled Nursing Facility for rehab with Palliative care service follow-up  Care plan was discussed with Patient    MDM high         Nihar Klus  Jeni Salles, PA-C  Palliative Medicine Team Team phone # [phone]  Thank you for allowing the Palliative Medicine Team to assist in the care of this patient. Please utilize secure chat with additional questions, if there is no response within 30 minutes please call the above phone number.  Palliative Medicine Team providers are available by phone from 7am to 7pm daily and can be reached through the team cell phone.  Should this patient require assistance outside of these hours, please call the patient's attending physician.

## 2023-05-06 NOTE — TOC Progression Note (Signed)
Transition of Care [facility]) - Progression Note    Patient Details  Name: Kaitlin Baker MRN: [id number] Date of Birth: October 14, 1955  Transition of [facility]) CM/SW Contact  Kairi Harshbarger A [place], [place] Phone Number: 05/06/2023, 3:01 PM  Clinical Narrative:     CSW met with pt at bedside. She said that her daughter is her power of attorney but currently she is able to make decisions and that her daughter does not "override" anything.   CSW presented bed offers. She said that she was familiar with [facility] and would be ok with them for rehab.   CSW to reach out to facility and find out bed availability.   CSW was informed by medical provider that pt was medically stable, insurance auth to be completed.   TOC will continue to follow.   Expected Discharge Plan: Home w Hospice Care    Expected Discharge Plan and Services                                               Social Determinants of Health (SDOH) Interventions SDOH Screenings   Food Insecurity: No Food Insecurity (05/04/2023)  Housing: Low Risk  (05/04/2023)  Transportation Needs: No Transportation Needs (05/04/2023)  Utilities: Not At Risk (05/04/2023)  Alcohol Screen: Low Risk  (07/17/2020)  Depression (PHQ2-9): Low Risk  (07/17/2020)  Financial Resource Strain: Low Risk  (07/17/2020)  Physical Activity: Inactive (07/17/2020)  Social Connections: Moderately Integrated (07/17/2020)  Stress: No Stress Concern Present (07/17/2020)  Tobacco Use: Low Risk  (05/04/2023)    Readmission Risk Interventions    05/05/2023    3:00 PM  Readmission Risk Prevention Plan  Post Dischage Appt Complete  Medication Screening Complete  Transportation Screening Complete

## 2023-05-06 NOTE — Plan of Care (Signed)

## 2023-05-06 NOTE — TOC Progression Note (Signed)
Transition of Care [facility]) - Progression Note    Patient Details  Name: Kaitlin Baker MRN: [id number] Date of Birth: Jul 27, 1956  Transition of Care [facility]) CM/SW Contact  Shiron Whetsel A [place], [place] Phone Number: 05/06/2023, 4:34 PM  Clinical Narrative:     CSW reached out to [facility] bed available once Berkley Harvey is approved.   CSW started insurance auth for SNF. Status pending. Auth ID: [id number]  TOC will continue to follow.  Expected Discharge Plan: Home w Hospice Care    Expected Discharge Plan and Services                                               Social Determinants of Health (SDOH) Interventions SDOH Screenings   Food Insecurity: No Food Insecurity (05/04/2023)  Housing: Low Risk  (05/04/2023)  Transportation Needs: No Transportation Needs (05/04/2023)  Utilities: Not At Risk (05/04/2023)  Alcohol Screen: Low Risk  (07/17/2020)  Depression (PHQ2-9): Low Risk  (07/17/2020)  Financial Resource Strain: Low Risk  (07/17/2020)  Physical Activity: Inactive (07/17/2020)  Social Connections: Moderately Integrated (07/17/2020)  Stress: No Stress Concern Present (07/17/2020)  Tobacco Use: Low Risk  (05/04/2023)    Readmission Risk Interventions    05/05/2023    3:00 PM  Readmission Risk Prevention Plan  Post Dischage Appt Complete  Medication Screening Complete  Transportation Screening Complete

## 2023-05-07 DIAGNOSIS — Z515 Encounter for palliative care: Secondary | ICD-10-CM

## 2023-05-07 DIAGNOSIS — E441 Mild protein-calorie malnutrition: Secondary | ICD-10-CM | POA: Diagnosis not present

## 2023-05-07 DIAGNOSIS — C7951 Secondary malignant neoplasm of bone: Secondary | ICD-10-CM | POA: Diagnosis not present

## 2023-05-07 MED ORDER — OXYCODONE HCL 5 MG PO TABS: 5.0000 mg | ORAL_TABLET | ORAL | 0 refills | Status: DC | PRN

## 2023-05-07 MED ORDER — CAPSAICIN 0.025 % EX CREA: TOPICAL_CREAM | Freq: Two times a day (BID) | CUTANEOUS | 0 refills | Status: DC | PRN

## 2023-05-07 MED ORDER — SENNA 8.6 MG PO TABS: 1.0000 | ORAL_TABLET | Freq: Two times a day (BID) | ORAL | 0 refills | Status: DC

## 2023-05-07 MED ORDER — ACETAMINOPHEN 500 MG PO TABS: 1000.0000 mg | ORAL_TABLET | Freq: Three times a day (TID) | ORAL | 0 refills | Status: DC

## 2023-05-07 MED ORDER — POLYETHYLENE GLYCOL 3350 17 G PO PACK: 17.0000 g | PACK | Freq: Two times a day (BID) | ORAL | 0 refills | Status: DC

## 2023-05-07 MED ORDER — OXYCODONE HCL ER 20 MG PO T12A: 20.0000 mg | EXTENDED_RELEASE_TABLET | Freq: Two times a day (BID) | ORAL | 0 refills | Status: DC

## 2023-05-07 MED ORDER — IBUPROFEN 200 MG PO TABS
800.0000 mg | ORAL_TABLET | Freq: Three times a day (TID) | ORAL | Status: DC
  Administered 2023-05-07 (×2): 800 mg via ORAL
  Filled 2023-05-07 (×2): qty 4

## 2023-05-07 MED ORDER — IBUPROFEN 800 MG PO TABS: 800.0000 mg | ORAL_TABLET | Freq: Three times a day (TID) | ORAL | 0 refills | Status: AC

## 2023-05-07 MED ORDER — LIDOCAINE 5 % EX PTCH: 1.0000 | MEDICATED_PATCH | CUTANEOUS | 0 refills | Status: DC

## 2023-05-07 MED ORDER — OXYCODONE HCL ER 10 MG PO T12A: 20.0000 mg | EXTENDED_RELEASE_TABLET | Freq: Two times a day (BID) | ORAL | Status: DC

## 2023-05-07 NOTE — Plan of Care (Signed)

## 2023-05-07 NOTE — Discharge Instructions (Addendum)
Hello Ms. Tolleson, it was a pleasure meeting you and get to know. I am glad I was able to be apart of your treatment team. You were treated in the hospital for cancer bone pain. You are going to [facility]. After hearing your concerns about the as needed pain medications we decided to increase the Oxycontin 15 tablet to 20mg  two times a day. Earlier we talked about adding on ibuprofen therapy in addition if needed and were doing that as well.  For pain management you have the scheduled Oxycontin extended release 20 mg two times a day and can request the Oxycodone 5mg  every 4 hours as needed. Tylenol is 1000mg  three times a day and is scheduled. Additionally we are scheduling the ibuprofen 800mg  three times a day.   Your new medications for pain control is: Oxycodone 12 hour tablet 20mg  two times a day for baseline pain control  Tylenol 1000mg  three times a day for baseline pain control  Oxycodone 5mg  as needed up to every four hours   Ibuprofen/advil 800mg  three times a day  You have senna/Senakot and Miralax to help with constipation from the pain medications  You still have the elacestrant/Orserdu 345mg  tablet for you cancer manegment

## 2023-05-07 NOTE — Progress Notes (Addendum)
Patient discharged to rehab.  Printed off AVS and placed in packet.  Removed PIV.    1753: Provided transport with packet. Transport loaded up patient and left without incident.    Attempted to call report Frankey Poot 2 times at [phone].  No answer.

## 2023-05-07 NOTE — TOC Progression Note (Signed)
Transition of Care [facility]) - Progression Note    Patient Details  Name: Kaitlin Baker MRN: [id number] Date of Birth: December 25, 1955  Transition of Care [facility]) CM/SW Contact  Ilda Laskin A [place], [place] Phone Number: 05/07/2023, 8:53 AM  Clinical Narrative:      Pt's auth approved for SNF.    Reference ID [id number] Berkley Harvey ID [id number]  Dates 05/07/2023-05/11/2023  CSW contacted [facility] regarding bed availability.   TOC will continue to follow.   Expected Discharge Plan: Home w Hospice Care    Expected Discharge Plan and Services                                               Social Determinants of Health (SDOH) Interventions SDOH Screenings   Food Insecurity: No Food Insecurity (05/04/2023)  Housing: Low Risk  (05/04/2023)  Transportation Needs: No Transportation Needs (05/04/2023)  Utilities: Not At Risk (05/04/2023)  Alcohol Screen: Low Risk  (07/17/2020)  Depression (PHQ2-9): Low Risk  (07/17/2020)  Financial Resource Strain: Low Risk  (07/17/2020)  Physical Activity: Inactive (07/17/2020)  Social Connections: Moderately Integrated (07/17/2020)  Stress: No Stress Concern Present (07/17/2020)  Tobacco Use: Low Risk  (05/04/2023)    Readmission Risk Interventions    05/05/2023    3:00 PM  Readmission Risk Prevention Plan  Post Dischage Appt Complete  Medication Screening Complete  Transportation Screening Complete

## 2023-05-07 NOTE — Discharge Summary (Signed)
Name: Kaitlin Baker MRN: [id number] DOB: Nov 26, 1955 67 y.o. PCP: No primary care provider on file.  Date of Admission: 05/04/2023  8:38 AM Date of Discharge: 05/07/2023 Attending Physician: Ginnie Smart, MD   Discharge Diagnosis: 1. Principal Problem:   Primary breast cancer with metastasis to other site [facility]) Active Problems:   Malignant neoplasm metastatic to bone (HCC)   Drug-induced constipation   Cancer related pain   Mild protein-calorie malnutrition (HCC)   Palliative care patient   Discharge Medications: Allergies as of 05/07/2023       Reactions   Codeine Nausea And Vomiting        Medication List     STOP taking these medications    oxyCODONE-acetaminophen 5-325 MG tablet Commonly known as: PERCOCET/ROXICET       TAKE these medications    acetaminophen 500 MG tablet Commonly known as: TYLENOL Take 2 tablets (1,000 mg total) by mouth 3 (three) times daily.   capsaicin 0.025 % cream Commonly known as: ZOSTRIX Apply topically 2 (two) times daily as needed (pain).   elacestrant hydrochloride 345 MG tablet Commonly known as: Orserdu Take 1 tablet (345 mg total) by mouth daily. Take with food.   ergocalciferol 1.25 MG (50000 UT) capsule Commonly known as: VITAMIN D2 Take 1 capsule (50,000 Units total) by mouth once a week. What changed: when to take this   ibuprofen 800 MG tablet Commonly known as: ADVIL Take 1 tablet (800 mg total) by mouth 3 (three) times daily for 4 days.   lidocaine 5 % Commonly known as: LIDODERM Place 1 patch onto the skin daily. Remove & Discard patch within 12 hours or as directed by MD   oxyCODONE 5 MG immediate release tablet Commonly known as: Oxy IR/ROXICODONE Take 1 tablet (5 mg total) by mouth every 4 (four) hours as needed for severe pain, breakthrough pain or moderate pain.   oxyCODONE 20 mg 12 hr tablet Commonly known as: OXYCONTIN Take 1 tablet (20 mg total) by mouth every 12 (twelve) hours.    polyethylene glycol 17 g packet Commonly known as: MIRALAX / GLYCOLAX Take 17 g by mouth 2 (two) times daily.   prochlorperazine 10 MG tablet Commonly known as: COMPAZINE Take 1 tablet (10 mg total) by mouth every 6 (six) hours as needed for nausea or vomiting.   senna 8.6 MG Tabs tablet Commonly known as: SENOKOT Take 1 tablet (8.6 mg total) by mouth 2 (two) times daily.       Current Facility-Administered Medications:    acetaminophen (TYLENOL) tablet 1,000 mg, 1,000 mg, Oral, TID, Cooper, Josseline P, PA-C, 1,000 mg at 05/07/23 0847   capsaicin (ZOSTRIX) 0.025 % cream, , Topical, BID PRN, Nooruddin, Saad, MD   elacestrant (Orserdu) tablet 345 mg - patient's own supply, 345 mg, Oral, Q breakfast, [name], MD, 345 mg at 05/07/23 0847   HYDROmorphone (DILAUDID) injection 0.5 mg, 0.5 mg, Intravenous, Q4H PRN, Nooruddin, Saad, MD, 0.5 mg at 05/07/23 8295   ibuprofen (ADVIL) tablet 800 mg, 800 mg, Oral, TID, Masters, Katie, DO   lidocaine (LIDODERM) 5 % 1 patch, 1 patch, Transdermal, Q24H, Nooruddin, Saad, MD, 1 patch at 05/06/23 1439   ondansetron (ZOFRAN) injection 4 mg, 4 mg, Intravenous, Once, Masters, Katie, DO   oxyCODONE (Oxy IR/ROXICODONE) immediate release tablet 5 mg, 5 mg, Oral, Q4H PRN, Cooper, Josseline P, PA-C, 5 mg at 05/06/23 1439   oxyCODONE (OXYCONTIN) 12 hr tablet 20 mg, 20 mg, Oral, Q12H, Masters, Dubois, DO  polyethylene glycol (MIRALAX / GLYCOLAX) packet 17 g, 17 g, Oral, BID, Koomson, Julius, MD, 17 g at 05/07/23 0847   senna (SENOKOT) tablet 8.6 mg, 1 tablet, Oral, BID, Laretta Bolster, MD, 8.6 mg at 05/07/23 0981   Disposition and follow-up:   Ms.Apolonia R Lausier was discharged from [facility] in Stable condition.  At the hospital follow up visit please address:  1.  Pain level and management.  2.  Labs / imaging needed at time of follow-up: none  3.  Pending labs/ test needing follow-up: none  Follow-up Appointments:   Follow-up Information     [facility] up.   Specialty: PALLIATIVE CARE Why: [facility] will be providing Outpatient Palliative Care Services. Contact information: [address] [place] [address] [phone]                Hospital Course by problem list:  # Severe Cancer Related Pain # Progression of Diffuse Skeletal Metastasis  Patient presented to the hospital on 8/20 for severe cancer related pain. Patient has had previous skeletal bone metastasis but imaging showed further progression of disease. She also requested palliative and hospice services as she understood her prognosis from conversations prior with her oncologist. During her hospital stay patients pain was controlled with Oxy15mg  bid and Oxy5mg  Q4h prn. She also endorsed relief with the lidoderm patch and icy/hot cream Prior to discharge patient called her palliative doctor with questions about pain medications. Patient stated she knows she will need the oxycodone 5mg  prn Q4 hours and felt she did not want to bother the SNF with frequent requests. Plan to increase long acting Oxy and keeping the prn the same, adding the NSAID therapy as previously discussed.  - Discharge to SNF [facility] today  - Increase oxycontin 15 bid to 20 bid  - Added high dose ibuprofen therapy 800mg  3 times a day - Continue elacestrant at SNF for cancer management  - Out patient rehab and out patient PMNR consult  - Continue with Miralax and senna for bowel regimen if no bowel movement may need docusate enema.  - Paper scripts signed for SNF and DNR paper work is signed    Discharge Subjective: Patient seen at bedside this morning. No acute overnight events. Patient states pain was 4/10 intensity at its worse, her baseline is a 3 or 4 intensity. Patient endorsed working with PT and was able to walk, sit up in chair. She denies any bowel movement but states she has been urinating and regularly passing gas,  she denied any abdominal pain or feelings of distension. Patient called palliative doctor in regards to pain meds, was re-evaluated at bed side and patient endored worsening pain after activity and anxiety about discharge regarding pain meds as she states that she will need her prn every 4 hours. Told we can increase her oxycontin ER from 15mg  to 20mg  bid and 5mg  oxycodone prn Q4h, adding on the ibuprofen therapy as previously discussed with patient. Spoke with social and bed is available today.   Discharge Exam:   BP (!) 141/76 (BP Location: Left Arm)   Pulse 77   Temp 98 F (36.7 C) (Oral)   Resp 16   SpO2 98%  Discharge exam:  Physical Exam Vitals and nursing note reviewed.  Constitutional:      Appearance: Normal appearance.  Cardiovascular:     Rate and Rhythm: Normal rate and regular rhythm.     Pulses: Normal pulses.     Heart sounds:  No murmur heard.    No friction rub. No gallop.  Pulmonary:     Effort: Pulmonary effort is normal.     Breath sounds: Normal breath sounds.  Abdominal:     General: Bowel sounds are normal.     Palpations: Abdomen is soft.  Neurological:     General: No focal deficit present.     Mental Status: She is alert and oriented to person, place, and time. Mental status is at baseline.  Psychiatric:        Mood and Affect: Mood normal.        Behavior: Behavior normal.        Thought Content: Thought content normal.        Judgment: Judgment normal.      Pertinent Labs, Studies, and Procedures:     Latest Ref Rng & Units 05/05/2023    3:12 AM 05/04/2023   10:13 AM 05/03/2023    9:28 AM  CMP  Glucose 70 - 99 mg/dL 91  784  92   BUN 8 - 23 mg/dL 10  13  22    Creatinine 0.44 - 1.00 mg/dL 6.96  2.95  2.84   Sodium 135 - 145 mmol/L 139  140  139   Potassium 3.5 - 5.1 mmol/L 3.4  3.5  4.2   Chloride 98 - 111 mmol/L 103  101  102   CO2 22 - 32 mmol/L 26  24  29    Calcium 8.9 - 10.3 mg/dL 9.0  9.5  9.5   Total Protein 6.5 - 8.1 g/dL 6.4  7.4  7.3    Total Bilirubin 0.3 - 1.2 mg/dL 0.3  0.5  0.3   Alkaline Phos 38 - 126 U/L 119  133  133   AST 15 - 41 U/L 17  23  19    ALT 0 - 44 U/L 14  16  17         Latest Ref Rng & Units 05/05/2023    3:12 AM 05/04/2023   10:13 AM 05/03/2023    9:28 AM  BMP  Glucose 70 - 99 mg/dL 91  132  92   BUN 8 - 23 mg/dL 10  13  22    Creatinine 0.44 - 1.00 mg/dL 4.40  1.02  7.25   Sodium 135 - 145 mmol/L 139  140  139   Potassium 3.5 - 5.1 mmol/L 3.4  3.5  4.2   Chloride 98 - 111 mmol/L 103  101  102   CO2 22 - 32 mmol/L 26  24  29    Calcium 8.9 - 10.3 mg/dL 9.0  9.5  9.5        Latest Ref Rng & Units 05/05/2023    3:12 AM 05/04/2023   10:13 AM 05/03/2023    9:28 AM  CBC  WBC 4.0 - 10.5 K/uL 7.9  8.1  7.6   Hemoglobin 12.0 - 15.0 g/dL 36.6  44.0  34.7   Hematocrit 36.0 - 46.0 % 35.0  38.7  37.2   Platelets 150 - 400 K/uL 321  326  320    CT T-SPINE NO CHARGE  Result Date: 05/04/2023 CLINICAL DATA:  67 year old female with back pain. Breast cancer with known skeletal metastases. EXAM: CT THORACIC SPINE WITHOUT CONTRAST TECHNIQUE: Multiplanar CT images of the thoracic spine were reconstructed from contemporary CT of the Chest. RADIATION DOSE REDUCTION: This exam was performed according to the departmental dose-optimization program which includes automated exposure control, adjustment of the mA and/or kV  according to patient size and/or use of iterative reconstruction technique. CONTRAST:  None COMPARISON:  CT Chest today are reported separately. PET-CT 01/21/2023. Whole-body bone scan 09/18/2022. FINDINGS: Limited cervical spine imaging: Partially visible cervical ACDF through C7. Thoracic spine segmentation:  Normal. Alignment: Overall thoracic kyphosis has not significantly changed since January. Vertebrae: Diffuse osseous metastatic disease with mixed lytic and sclerotic lesions at all thoracic vertebral levels. Some hardware artifact from the cervical spine but T1 vertebral body osteolysis appears  progressed since January. T2 and T3 appears stable since January, chronic pathologic compression fracture at the latter. T4 anterior and posterior vertebral body osteolysis has progressed since January. T5 appears stable and largely intact. Posterior T6 and T7 osteolysis is only mildly progressed since January. T8 lytic metastases with chronic pathologic compression fracture is largely stable, but posterior body osteolysis has somewhat progressed since January. T9 is stable. T10 posterior vertebral body osteolysis is substantially new since January. T11 mild chronic pathologic compression fracture is stable but osteolysis of the body has progressed. New T12 pathologic compression fracture since January (40% loss of height and chronic but increased vertebral body osteolysis. L1 vertebral body osteolysis has also progressed while mild compression appears stable. Paraspinal and other soft tissues: Paraspinal soft tissue tumor has increased since the January CT at T8, T10, T12. And un uncertain degree of thoracic spinal canal involvement is present at those levels. Other chest findings today reported separately. Disc levels: Cannot exclude malignant thoracic spinal stenosis. IMPRESSION: 1. Diffuse skeletal metastases with progression of thoracic vertebral lytic lesions since January at T1, T4, T10, T11, and T12. And associated new or increased paraspinal/extraosseous extension of tumor at T8, T10, and T12. Epidural tumor infiltration and malignant thoracic spinal stenosis not excluded. Thoracic MRI without and with contrast would best evaluate further. 2. New pathologic compression fracture at T12 since January (40% loss of height). Stable pathologic compression fractures elsewhere. 3. Other Chest findings today reported separately. Electronically Signed   By: Odessa Fleming M.D.   On: 05/04/2023 12:11   CT Chest Wo Contrast  Result Date: 05/04/2023 CLINICAL DATA:  67 year old female with back pain. Breast cancer with  known skeletal metastases. EXAM: CT CHEST WITHOUT CONTRAST TECHNIQUE: Multidetector CT imaging of the chest was performed following the standard protocol without IV contrast. RADIATION DOSE REDUCTION: This exam was performed according to the departmental dose-optimization program which includes automated exposure control, adjustment of the mA and/or kV according to patient size and/or use of iterative reconstruction technique. COMPARISON:  PET-CT 01/21/2023, restaging CT Chest, Abdomen, and Pelvis with contrast 09/18/2022. Thoracic spine CT today reported separately. FINDINGS: Cardiovascular: Calcified coronary artery (series 3, image 79) and Calcified aortic atherosclerosis. Vascular patency is not evaluated in the absence of IV contrast. Borderline cardiomegaly now. No pericardial effusion. Mediastinum/Nodes: Chronic heterogeneous enlargement of the thyroid isthmus appears stable since 2021and negative by PET, In the setting of significant comorbidities or limited life expectancy, no follow-up recommended (ref: J Am Coll Radiol. 2015 Feb;12(2): 143-50). No mediastinal mass or lymphadenopathy is evident in the absence of contrast. Small to moderate gastric hiatal hernia appears stable. Lungs/Pleura: Major airways are patent. Lung volumes are stable since January. Platelike opacity in both lower lobe posterior basal segments now most resembles atelectasis and is superimposed on chronic scarring in the bilateral anterior and lateral basal segments. No pleural effusion. No lung nodule. Upper Abdomen: Surgically absent gallbladder. Negative visible noncontrast liver, spleen, pancreas, adrenal glands, left kidney, and bowel in the upper abdomen. No free air  or free fluid. Musculoskeletal: Diffuse combined lytic and sclerotic skeletal metastases throughout the chest. Bilateral shoulder, bilateral rib, and lytic sternal metastases do not appear significantly changed since May. Thoracic spine is detailed separately.  IMPRESSION: 1. Diffuse skeletal metastases. Thoracic spine CT is reported separately today. 2. No lung or mediastinal metastasis identified by noncontrast chest CT. Platelike atelectasis suspected in both lower lobes today, superimposed on areas of chronic lower lobe scarring. 3.  Aortic Atherosclerosis (ICD10-I70.0). Electronically Signed   By: Odessa Fleming M.D.   On: 05/04/2023 11:58      Discharge Instructions: Discharge Instructions     Ambulatory referral to Physical Medicine Rehab   Complete by: As directed    Acute on chronic cancer pain 2/2 bony metastasis, R humerus and shoulder pain 2/2 orthopedic surgery 01/22/23   Diet - low sodium heart healthy   Complete by: As directed    Discharge instructions   Complete by: As directed    Ms. Orear,  You were recently admitted to [facility] for severe pain.  You are being discharged today to a rehab facility to rebuild your strength. Several new medications have been started to help with your pain, please see below.  1. Oxycodone Extended Release 20 mg 2 times daily (increased dose from in hospital) 2. Oxycodone Immediate Release 5 mg every 4 hours 3. Tylenol 1000 mg every 6 hours 4. Ibuprofen 800 mg every 8 hours for 4 days 5. Lidoderm patch 6. Capsaicin creme  We are so glad that you are feeling better. Palliative care will follow-up with you as well. Please make a follow-up appointment to your primary care doctor.  Sincerely, Katie Masters, DO   Increase activity slowly   Complete by: As directed        Signed: Martie Round, Medical Student 05/07/2023, 10:22 AM   Pager: [phone]

## 2023-05-07 NOTE — Progress Notes (Signed)
PT Cancellation Note  Patient Details Name: LILYAN KOLACZ MRN: [id number] DOB: [birth year]   Cancelled Treatment:    Reason Eval/Treat Not Completed: (P) Pain limiting ability to participate;Fatigue/lethargy limiting ability to participate (Pt defers due to c/o pain and wanting to save her strength for transport to SNF today.)   Naeema Patlan M Tramane Gorum 05/07/2023, 11:08 AM

## 2023-05-07 NOTE — Care Management Important Message (Signed)
Important Message  Patient Details  Name: Kaitlin Baker MRN: [id number] Date of Birth: 1955/12/21   Medicare Important Message Given:  Yes     Tamala Manzer Stefan Church 05/07/2023, 1:11 PM

## 2023-05-07 NOTE — Progress Notes (Signed)
Occupational Therapy Treatment Patient Details Name: Kaitlin Baker MRN: [id number] DOB: Jun 25, 1956 Today's Date: 05/07/2023   History of present illness 67 y.o. female presents to [facility] hospital on 05/04/2023 with unbearable L back pain related to known bony metastases. PMH includes L breast CA with bony mets to spine, ribs, hips, HLD, OA, 01/22/23 s/p Humeral intramedullary nail with proximal and distal  locking screw fixation.   OT comments  Pt limited by lethargy in today's session, receptive to exercises. Educated pt on bed level exercises with green theraband (see below), handout provided. Pt able to perform exercises accurately with instructions but due to RUE deficits she remained limited with RUE ROM. OT to continue to make efforts progressing pt as able, DC plans remain appropriate for SNF.       If plan is discharge home, recommend the following:  A little help with walking and/or transfers;A lot of help with bathing/dressing/bathroom;Assistance with cooking/housework;Help with stairs or ramp for entrance;Assist for transportation   Equipment Recommendations  Other (comment) (defer to next level of care)    Recommendations for Other Services      Precautions / Restrictions Precautions Precautions: Fall Precaution Comments: multiple areas of bony metastases, RUE pain since surgery in may Restrictions Weight Bearing Restrictions: No        ADL either performed or assessed with clinical judgement   ADL         General ADL Comments: Pt limited by lethargy, wanting to conserve energy for DC. Pt open to bed level exercises with therabands      Cognition Arousal: Alert Behavior During Therapy: WFL for tasks assessed/performed Overall Cognitive Status: Within Functional Limits for tasks assessed              Exercises General Exercises - Upper Extremity Shoulder Flexion: AROM, Supine, Theraband, Both, 10 reps Theraband Level (Shoulder Flexion): Level 3  (Green) Shoulder ABduction: AROM, Both, 10 reps, Limitations, Theraband Theraband Level (Shoulder Abduction): Level 3 (Green) Shoulder Abduction Limitations: to tolerance due to RUE deficits Shoulder Horizontal ABduction: AROM, Left, 10 reps, Theraband Theraband Level (Shoulder Horizontal Abduction): Level 3 (Green) Elbow Flexion: AROM, Both, 10 reps, Theraband Theraband Level (Elbow Flexion): Level 3 (Green) Elbow Extension: AROM, Theraband, 10 reps Theraband Level (Elbow Extension): Level 3 (Green)            Pertinent Vitals/ Pain       Pain Assessment Pain Assessment: Faces Faces Pain Scale: Hurts little more Pain Location: R arm Pain Descriptors / Indicators: Aching, Discomfort, Grimacing, Guarding Pain Intervention(s): Monitored during session, Limited activity within patient's tolerance, Repositioned   Frequency  Min 1X/week        Progress Toward Goals  OT Goals(current goals can now be found in the care plan section)  Progress towards OT goals: Progressing toward goals  Acute Rehab OT Goals Patient Stated Goal: To get better OT Goal Formulation: With patient Time For Goal Achievement: 05/19/23 Potential to Achieve Goals: Good  Plan         AM-PAC OT "6 Clicks" Daily Activity     Outcome Measure   Help from another person eating meals?: None Help from another person taking care of personal grooming?: A Little Help from another person toileting, which includes using toliet, bedpan, or urinal?: A Lot Help from another person bathing (including washing, rinsing, drying)?: A Lot Help from another person to put on and taking off regular upper body clothing?: None Help from another person to put on and taking off regular  lower body clothing?: A Lot 6 Click Score: 17    End of Session Equipment Utilized During Treatment: Other (comment) (theraband)  OT Visit Diagnosis: Muscle weakness (generalized) (M62.81);Pain   Activity Tolerance Patient limited by  lethargy   Patient Left in bed;with call bell/phone within reach   Nurse Communication Mobility status        Time: 1112-1140 OT Time Calculation (min): 28 min  Charges: OT General Charges $OT Visit: 1 Visit OT Treatments $Therapeutic Exercise: 23-37 mins  05/07/2023  AB, OTR/L  Acute Rehabilitation Services  Office: [phone]   Tristan Schroeder 05/07/2023, 12:49 PM

## 2023-05-07 NOTE — TOC Transition Note (Signed)
Transition of Care [facility]) - CM/SW Discharge Note   Patient Details  Name: Kaitlin Baker MRN: [id number] Date of Birth: [birth year]  Transition of [facility]) CM/SW Contact:  Neveah Bang A [place], LCSWA Phone Number: 05/07/2023, 11:33 AM   Clinical Narrative:     Patient will DC to: [facility]   Anticipated DC date: 05/07/23  Family notified: [name]  Transport by: Sharin Mons      Per MD patient ready for DC to [facility]. RN, patient, patient's family, and facility notified of DC. Discharge Summary and FL2 sent to facility. RN to call report prior to discharge ( [phone], [phone]). DC packet on chart. Ambulance transport requested for patient.     CSW will sign off for now as social work intervention is no longer needed. Please consult [place] again if new needs arise.   Final next level of care: Skilled Nursing Facility Barriers to Discharge: Barriers Resolved   Patient Goals and CMS Choice      Discharge Placement                Patient chooses bed at: [facility] Patient to be transferred to facility by: [facility] Name of family member notified: Dorisann Frames Furniss-Roe Patient and family notified of of transfer: 05/07/23  Discharge Plan and Services Additional resources added to the After Visit Summary for                                       Social Determinants of Health (SDOH) Interventions SDOH Screenings   Food Insecurity: No Food Insecurity (05/04/2023)  Housing: Low Risk  (05/04/2023)  Transportation Needs: No Transportation Needs (05/04/2023)  Utilities: Not At Risk (05/04/2023)  Alcohol Screen: Low Risk  (07/17/2020)  Depression (PHQ2-9): Low Risk  (07/17/2020)  Financial Resource Strain: Low Risk  (07/17/2020)  Physical Activity: Inactive (07/17/2020)  Social Connections: Moderately Integrated (07/17/2020)  Stress: No Stress Concern Present (07/17/2020)  Tobacco Use: Low Risk  (05/04/2023)     Readmission Risk Interventions     05/05/2023    3:00 PM  Readmission Risk Prevention Plan  Post Dischage Appt Complete  Medication Screening Complete  Transportation Screening Complete

## 2023-05-24 ENCOUNTER — Other Ambulatory Visit: Payer: Self-pay

## 2023-05-24 ENCOUNTER — Other Ambulatory Visit (HOSPITAL_COMMUNITY): Payer: Self-pay | Admitting: Hematology

## 2023-05-24 MED ORDER — OXYCODONE HCL 5 MG PO TABS: 5.0000 mg | ORAL_TABLET | Freq: Four times a day (QID) | ORAL | 0 refills | Status: DC | PRN

## 2023-05-24 MED ORDER — OXYCODONE HCL ER 20 MG PO T12A: 20.0000 mg | EXTENDED_RELEASE_TABLET | Freq: Two times a day (BID) | ORAL | 0 refills | Status: DC

## 2023-05-25 ENCOUNTER — Other Ambulatory Visit: Payer: Self-pay | Admitting: *Deleted

## 2023-05-25 ENCOUNTER — Other Ambulatory Visit: Payer: Self-pay

## 2023-05-25 ENCOUNTER — Encounter: Payer: Self-pay | Admitting: *Deleted

## 2023-05-25 DIAGNOSIS — C7951 Secondary malignant neoplasm of bone: Secondary | ICD-10-CM

## 2023-05-25 DIAGNOSIS — C50912 Malignant neoplasm of unspecified site of left female breast: Secondary | ICD-10-CM

## 2023-05-26 ENCOUNTER — Other Ambulatory Visit: Payer: Self-pay | Admitting: *Deleted

## 2023-05-26 DIAGNOSIS — C7951 Secondary malignant neoplasm of bone: Secondary | ICD-10-CM

## 2023-05-26 DIAGNOSIS — G893 Neoplasm related pain (acute) (chronic): Secondary | ICD-10-CM

## 2023-05-26 MED ORDER — OXYCODONE HCL ER 20 MG PO T12A
20.0000 mg | EXTENDED_RELEASE_TABLET | Freq: Two times a day (BID) | ORAL | 0 refills | Status: DC
Start: 2023-05-26 — End: 2023-05-26

## 2023-05-26 MED ORDER — OXYCODONE HCL ER 20 MG PO T12A: 20.0000 mg | EXTENDED_RELEASE_TABLET | Freq: Two times a day (BID) | ORAL | 0 refills | Status: DC

## 2023-05-27 ENCOUNTER — Other Ambulatory Visit: Payer: Self-pay | Admitting: *Deleted

## 2023-05-27 DIAGNOSIS — C7951 Secondary malignant neoplasm of bone: Secondary | ICD-10-CM

## 2023-05-27 DIAGNOSIS — G893 Neoplasm related pain (acute) (chronic): Secondary | ICD-10-CM

## 2023-05-27 MED ORDER — OXYCODONE HCL ER 20 MG PO T12A: 20.0000 mg | EXTENDED_RELEASE_TABLET | Freq: Two times a day (BID) | ORAL | 0 refills | Status: DC

## 2023-06-01 ENCOUNTER — Telehealth: Payer: Self-pay | Admitting: *Deleted

## 2023-06-01 NOTE — Telephone Encounter (Signed)
Received call from [facility] with [facility] and they are following her for Palliative Care at this time.

## 2023-06-03 ENCOUNTER — Other Ambulatory Visit: Payer: Self-pay | Admitting: *Deleted

## 2023-06-03 ENCOUNTER — Emergency Department (HOSPITAL_COMMUNITY)
Admission: EM | Admit: 2023-06-03 | Discharge: 2023-06-03 | Discharge status: Against Medical Advice | Payer: Medicare Other | Attending: Emergency Medicine | Admitting: Emergency Medicine

## 2023-06-03 ENCOUNTER — Encounter (HOSPITAL_COMMUNITY): Payer: Self-pay | Admitting: Emergency Medicine

## 2023-06-03 ENCOUNTER — Other Ambulatory Visit: Payer: Self-pay

## 2023-06-03 DIAGNOSIS — Z853 Personal history of malignant neoplasm of breast: Secondary | ICD-10-CM | POA: Diagnosis not present

## 2023-06-03 DIAGNOSIS — M791 Myalgia, unspecified site: Secondary | ICD-10-CM | POA: Diagnosis present

## 2023-06-03 DIAGNOSIS — Z5321 Procedure and treatment not carried out due to patient leaving prior to being seen by health care provider: Secondary | ICD-10-CM | POA: Insufficient documentation

## 2023-06-03 LAB — COMPREHENSIVE METABOLIC PANEL
ALT: 14 U/L (ref 0–44)
AST: 18 U/L (ref 15–41)
Albumin: 3.3 g/dL — ABNORMAL LOW (ref 3.5–5.0)
Alkaline Phosphatase: 155 U/L — ABNORMAL HIGH (ref 38–126)
Anion gap: 11 (ref 5–15)
BUN: 12 mg/dL (ref 8–23)
CO2: 27 mmol/L (ref 22–32)
Calcium: 9 mg/dL (ref 8.9–10.3)
Chloride: 100 mmol/L (ref 98–111)
Creatinine, Ser: 0.78 mg/dL (ref 0.44–1.00)
GFR, Estimated: >60 mL/min (ref >60)
Glucose, Bld: 90 mg/dL (ref 70–99)
Potassium: 3.1 mmol/L — ABNORMAL LOW (ref 3.5–5.1)
Sodium: 138 mmol/L (ref 135–145)
Total Bilirubin: 0.6 mg/dL (ref 0.3–1.2)
Total Protein: 6.7 g/dL (ref 6.5–8.1)

## 2023-06-03 LAB — LIPASE, BLOOD: Lipase: 21 U/L (ref 11–51)

## 2023-06-03 LAB — URINALYSIS, ROUTINE W REFLEX MICROSCOPIC
Bilirubin Urine: NEGATIVE
Glucose, UA: NEGATIVE mg/dL
Hgb urine dipstick: NEGATIVE
Ketones, ur: NEGATIVE mg/dL
Nitrite: POSITIVE — AB
Protein, ur: NEGATIVE mg/dL
Specific Gravity, Urine: 1.015 (ref 1.005–1.030)
WBC, UA: >50 WBC/hpf (ref 0–5)
pH: 6 (ref 5.0–8.0)

## 2023-06-03 LAB — CBC
HCT: 35.2 % — ABNORMAL LOW (ref 36.0–46.0)
Hemoglobin: 11.1 g/dL — ABNORMAL LOW (ref 12.0–15.0)
MCH: 28.6 pg (ref 26.0–34.0)
MCHC: 31.5 g/dL (ref 30.0–36.0)
MCV: 90.7 fL (ref 80.0–100.0)
Platelets: 307 10*3/uL (ref 150–400)
RBC: 3.88 MIL/uL (ref 3.87–5.11)
RDW: 14.4 % (ref 11.5–15.5)
WBC: 7.9 10*3/uL (ref 4.0–10.5)
nRBC: 0 % (ref 0.0–0.2)

## 2023-06-03 MED ORDER — ONDANSETRON 4 MG PO TBDP
4.0000 mg | ORAL_TABLET | Freq: Once | ORAL | Status: AC | PRN
  Administered 2023-06-03: 4 mg via ORAL
  Filled 2023-06-03: qty 1

## 2023-06-03 NOTE — ED Triage Notes (Signed)
Pt BIB EMS for full body pain, hx of malignant breast cancer  Pt took 5 mg of Oxycodone PTA but it has not helped pain BP 156/78 HR 77 O2 98% on RA

## 2023-06-03 NOTE — ED Notes (Signed)
Pt to the lobby at this time. Informed there were no available rooms at this time. That we would do our best to make her comfortable. Pt was provided with two warm blankets, a pillow, and legs were propped up for comfort by NT. Pt remains to triage unable room available.

## 2023-06-03 NOTE — ED Notes (Signed)
Pt called RN to lobby. Inform there are no room available. Offered alternatives to make pt feel more comfortable while waiting. Pt refused stating her daughter is coming to pick her up

## 2023-06-03 NOTE — ED Notes (Signed)
Pt place in the waiting room, given blankets and propped her legs up on a accommodating chair along with pillow for comfort. Triage RN aware.

## 2023-06-08 ENCOUNTER — Other Ambulatory Visit: Payer: Self-pay | Admitting: *Deleted

## 2023-06-08 DIAGNOSIS — C50912 Malignant neoplasm of unspecified site of left female breast: Secondary | ICD-10-CM

## 2023-06-08 DIAGNOSIS — C7951 Secondary malignant neoplasm of bone: Secondary | ICD-10-CM

## 2023-06-08 MED ORDER — ELACESTRANT HYDROCHLORIDE 345 MG PO TABS: 345.0000 mg | ORAL_TABLET | Freq: Every day | ORAL | 3 refills | Status: DC

## 2023-06-17 ENCOUNTER — Inpatient Hospital Stay: Payer: Medicare Other

## 2023-06-17 ENCOUNTER — Other Ambulatory Visit (HOSPITAL_COMMUNITY): Payer: Medicare Other

## 2023-06-24 ENCOUNTER — Inpatient Hospital Stay: Payer: Medicare Other | Admitting: Hematology

## 2023-07-03 ENCOUNTER — Inpatient Hospital Stay (HOSPITAL_COMMUNITY)
Admission: EM | Admit: 2023-07-03 | Discharge: 2023-07-06 | DRG: 543 | Disposition: A | Discharge status: Other Acute Inpatient Hospital | Attending: Internal Medicine | Admitting: Internal Medicine

## 2023-07-03 ENCOUNTER — Encounter (HOSPITAL_COMMUNITY): Payer: Self-pay | Admitting: Emergency Medicine

## 2023-07-03 ENCOUNTER — Emergency Department (HOSPITAL_COMMUNITY)

## 2023-07-03 DIAGNOSIS — Z800 Family history of malignant neoplasm of digestive organs: Secondary | ICD-10-CM

## 2023-07-03 DIAGNOSIS — Z1721 Progesterone receptor positive status: Secondary | ICD-10-CM

## 2023-07-03 DIAGNOSIS — M84552A Pathological fracture in neoplastic disease, left femur, initial encounter for fracture: Principal | ICD-10-CM | POA: Diagnosis present

## 2023-07-03 DIAGNOSIS — Z811 Family history of alcohol abuse and dependence: Secondary | ICD-10-CM

## 2023-07-03 DIAGNOSIS — Z825 Family history of asthma and other chronic lower respiratory diseases: Secondary | ICD-10-CM

## 2023-07-03 DIAGNOSIS — C7951 Secondary malignant neoplasm of bone: Secondary | ICD-10-CM | POA: Diagnosis present

## 2023-07-03 DIAGNOSIS — Z9049 Acquired absence of other specified parts of digestive tract: Secondary | ICD-10-CM

## 2023-07-03 DIAGNOSIS — D72829 Elevated white blood cell count, unspecified: Secondary | ICD-10-CM | POA: Diagnosis present

## 2023-07-03 DIAGNOSIS — Z66 Do not resuscitate: Secondary | ICD-10-CM | POA: Diagnosis present

## 2023-07-03 DIAGNOSIS — Z808 Family history of malignant neoplasm of other organs or systems: Secondary | ICD-10-CM

## 2023-07-03 DIAGNOSIS — C50919 Malignant neoplasm of unspecified site of unspecified female breast: Secondary | ICD-10-CM | POA: Diagnosis present

## 2023-07-03 DIAGNOSIS — Z803 Family history of malignant neoplasm of breast: Secondary | ICD-10-CM

## 2023-07-03 DIAGNOSIS — Z885 Allergy status to narcotic agent status: Secondary | ICD-10-CM

## 2023-07-03 DIAGNOSIS — Z170 Estrogen receptor positive status [ER+]: Secondary | ICD-10-CM

## 2023-07-03 DIAGNOSIS — M8440XA Pathological fracture, unspecified site, initial encounter for fracture: Secondary | ICD-10-CM | POA: Diagnosis present

## 2023-07-03 DIAGNOSIS — E7800 Pure hypercholesterolemia, unspecified: Secondary | ICD-10-CM | POA: Diagnosis present

## 2023-07-03 DIAGNOSIS — Z515 Encounter for palliative care: Secondary | ICD-10-CM

## 2023-07-03 DIAGNOSIS — Z79899 Other long term (current) drug therapy: Secondary | ICD-10-CM

## 2023-07-03 DIAGNOSIS — G893 Neoplasm related pain (acute) (chronic): Secondary | ICD-10-CM | POA: Diagnosis present

## 2023-07-03 DIAGNOSIS — R748 Abnormal levels of other serum enzymes: Secondary | ICD-10-CM | POA: Diagnosis present

## 2023-07-03 DIAGNOSIS — C50912 Malignant neoplasm of unspecified site of left female breast: Secondary | ICD-10-CM | POA: Diagnosis present

## 2023-07-03 DIAGNOSIS — Z79891 Long term (current) use of opiate analgesic: Secondary | ICD-10-CM

## 2023-07-03 MED ORDER — MORPHINE SULFATE ER 15 MG PO TBCR
60.0000 mg | EXTENDED_RELEASE_TABLET | Freq: Once | ORAL | Status: AC
  Administered 2023-07-03: 60 mg via ORAL
  Filled 2023-07-03: qty 4

## 2023-07-03 MED ORDER — OXYCODONE HCL 5 MG PO TABS
10.0000 mg | ORAL_TABLET | Freq: Once | ORAL | Status: AC
  Administered 2023-07-03: 10 mg via ORAL
  Filled 2023-07-03: qty 2

## 2023-07-03 NOTE — ED Triage Notes (Signed)
Pt here from home with c/o left leg pain she felt a snap in her leg while walking pt has known bone CA

## 2023-07-03 NOTE — ED Provider Triage Note (Signed)
Emergency Medicine Provider Triage Evaluation Note  Kaitlin Baker , a 67 y.o. female  was evaluated in triage.  Pt complains of left femur pain, that occurred when she was walking.  She states she has a history of cancer, with bone mets, and it feels like her femur snapped in half.  She has had this happen before.  Denies any numbness, tingling of the leg.  States she cannot walk however now.  Review of Systems  Positive: Leg pain Negative: numbness  Physical Exam  BP 102/75   Pulse 91   Temp 98 F (36.7 C)   Resp 15   SpO2 98%  Gen:   Awake, no distress   Resp:  Normal effort  MSK:   Moves extremities without difficulty  Other:  TTP Of L proximal femur, +DP pulse, Limited ROM of LLE  Medical Decision Making  Medically screening exam initiated at 5:54 PM.  Appropriate orders placed.  CHEREKA MEGGISON was informed that the remainder of the evaluation will be completed by another provider, this initial triage assessment does not replace that evaluation, and the importance of remaining in the ED until their evaluation is complete.    Pete Pelt, [place] 07/03/23 1755

## 2023-07-04 ENCOUNTER — Inpatient Hospital Stay (HOSPITAL_COMMUNITY)

## 2023-07-04 ENCOUNTER — Other Ambulatory Visit: Payer: Self-pay

## 2023-07-04 ENCOUNTER — Emergency Department (HOSPITAL_COMMUNITY)

## 2023-07-04 DIAGNOSIS — C50912 Malignant neoplasm of unspecified site of left female breast: Secondary | ICD-10-CM | POA: Diagnosis present

## 2023-07-04 DIAGNOSIS — M84552A Pathological fracture in neoplastic disease, left femur, initial encounter for fracture: Principal | ICD-10-CM

## 2023-07-04 DIAGNOSIS — M8440XA Pathological fracture, unspecified site, initial encounter for fracture: Secondary | ICD-10-CM | POA: Diagnosis present

## 2023-07-04 DIAGNOSIS — Z825 Family history of asthma and other chronic lower respiratory diseases: Secondary | ICD-10-CM | POA: Diagnosis not present

## 2023-07-04 DIAGNOSIS — Z79899 Other long term (current) drug therapy: Secondary | ICD-10-CM | POA: Diagnosis not present

## 2023-07-04 DIAGNOSIS — Z9049 Acquired absence of other specified parts of digestive tract: Secondary | ICD-10-CM | POA: Diagnosis not present

## 2023-07-04 DIAGNOSIS — Z1721 Progesterone receptor positive status: Secondary | ICD-10-CM | POA: Diagnosis not present

## 2023-07-04 DIAGNOSIS — Z885 Allergy status to narcotic agent status: Secondary | ICD-10-CM | POA: Diagnosis not present

## 2023-07-04 DIAGNOSIS — G893 Neoplasm related pain (acute) (chronic): Secondary | ICD-10-CM | POA: Diagnosis present

## 2023-07-04 DIAGNOSIS — D72829 Elevated white blood cell count, unspecified: Secondary | ICD-10-CM | POA: Diagnosis present

## 2023-07-04 DIAGNOSIS — Z170 Estrogen receptor positive status [ER+]: Secondary | ICD-10-CM | POA: Diagnosis not present

## 2023-07-04 DIAGNOSIS — Z800 Family history of malignant neoplasm of digestive organs: Secondary | ICD-10-CM | POA: Diagnosis not present

## 2023-07-04 DIAGNOSIS — Z803 Family history of malignant neoplasm of breast: Secondary | ICD-10-CM | POA: Diagnosis not present

## 2023-07-04 DIAGNOSIS — R748 Abnormal levels of other serum enzymes: Secondary | ICD-10-CM | POA: Diagnosis present

## 2023-07-04 DIAGNOSIS — Z515 Encounter for palliative care: Secondary | ICD-10-CM | POA: Diagnosis not present

## 2023-07-04 DIAGNOSIS — E7800 Pure hypercholesterolemia, unspecified: Secondary | ICD-10-CM | POA: Diagnosis present

## 2023-07-04 DIAGNOSIS — C7951 Secondary malignant neoplasm of bone: Secondary | ICD-10-CM | POA: Diagnosis present

## 2023-07-04 DIAGNOSIS — Z66 Do not resuscitate: Secondary | ICD-10-CM | POA: Diagnosis present

## 2023-07-04 DIAGNOSIS — Z808 Family history of malignant neoplasm of other organs or systems: Secondary | ICD-10-CM | POA: Diagnosis not present

## 2023-07-04 DIAGNOSIS — Z811 Family history of alcohol abuse and dependence: Secondary | ICD-10-CM | POA: Diagnosis not present

## 2023-07-04 DIAGNOSIS — Z79891 Long term (current) use of opiate analgesic: Secondary | ICD-10-CM | POA: Diagnosis not present

## 2023-07-04 LAB — CBC
HCT: 38.5 % (ref 36.0–46.0)
Hemoglobin: 12.2 g/dL (ref 12.0–15.0)
MCH: 27.8 pg (ref 26.0–34.0)
MCHC: 31.7 g/dL (ref 30.0–36.0)
MCV: 87.7 fL (ref 80.0–100.0)
Platelets: 262 10*3/uL (ref 150–400)
RBC: 4.39 MIL/uL (ref 3.87–5.11)
RDW: 15.9 % — ABNORMAL HIGH (ref 11.5–15.5)
WBC: 11.3 10*3/uL — ABNORMAL HIGH (ref 4.0–10.5)
nRBC: 0 % (ref 0.0–0.2)

## 2023-07-04 LAB — CBC WITH DIFFERENTIAL/PLATELET
Abs Immature Granulocytes: 0.25 10*3/uL — ABNORMAL HIGH (ref 0.00–0.07)
Basophils Absolute: 0 10*3/uL (ref 0.0–0.1)
Basophils Relative: 0 %
Eosinophils Absolute: 0 10*3/uL (ref 0.0–0.5)
Eosinophils Relative: 0 %
HCT: 38.6 % (ref 36.0–46.0)
Hemoglobin: 12.3 g/dL (ref 12.0–15.0)
Immature Granulocytes: 2 %
Lymphocytes Relative: 19 %
Lymphs Abs: 2.1 10*3/uL (ref 0.7–4.0)
MCH: 27.9 pg (ref 26.0–34.0)
MCHC: 31.9 g/dL (ref 30.0–36.0)
MCV: 87.5 fL (ref 80.0–100.0)
Monocytes Absolute: 0.6 10*3/uL (ref 0.1–1.0)
Monocytes Relative: 5 %
Neutro Abs: 8.4 10*3/uL — ABNORMAL HIGH (ref 1.7–7.7)
Neutrophils Relative %: 74 %
Platelets: 269 10*3/uL (ref 150–400)
RBC: 4.41 MIL/uL (ref 3.87–5.11)
RDW: 15.9 % — ABNORMAL HIGH (ref 11.5–15.5)
WBC: 11.3 10*3/uL — ABNORMAL HIGH (ref 4.0–10.5)
nRBC: 0 % (ref 0.0–0.2)

## 2023-07-04 LAB — BASIC METABOLIC PANEL
Anion gap: 9 (ref 5–15)
BUN: 22 mg/dL (ref 8–23)
CO2: 29 mmol/L (ref 22–32)
Calcium: 9 mg/dL (ref 8.9–10.3)
Chloride: 101 mmol/L (ref 98–111)
Creatinine, Ser: 0.72 mg/dL (ref 0.44–1.00)
GFR, Estimated: >60 mL/min (ref >60)
Glucose, Bld: 92 mg/dL (ref 70–99)
Potassium: 4.7 mmol/L (ref 3.5–5.1)
Sodium: 139 mmol/L (ref 135–145)

## 2023-07-04 LAB — COMPREHENSIVE METABOLIC PANEL
ALT: 19 U/L (ref 0–44)
AST: 18 U/L (ref 15–41)
Albumin: 2.9 g/dL — ABNORMAL LOW (ref 3.5–5.0)
Alkaline Phosphatase: 334 U/L — ABNORMAL HIGH (ref 38–126)
Anion gap: 10 (ref 5–15)
BUN: 22 mg/dL (ref 8–23)
CO2: 28 mmol/L (ref 22–32)
Calcium: 8.6 mg/dL — ABNORMAL LOW (ref 8.9–10.3)
Chloride: 103 mmol/L (ref 98–111)
Creatinine, Ser: 0.79 mg/dL (ref 0.44–1.00)
GFR, Estimated: >60 mL/min (ref >60)
Glucose, Bld: 96 mg/dL (ref 70–99)
Potassium: 3.9 mmol/L (ref 3.5–5.1)
Sodium: 141 mmol/L (ref 135–145)
Total Bilirubin: 0.4 mg/dL (ref 0.3–1.2)
Total Protein: 6.1 g/dL — ABNORMAL LOW (ref 6.5–8.1)

## 2023-07-04 LAB — MAGNESIUM: Magnesium: 2.2 mg/dL (ref 1.7–2.4)

## 2023-07-04 LAB — PHOSPHORUS: Phosphorus: 3.3 mg/dL (ref 2.5–4.6)

## 2023-07-04 MED ORDER — SODIUM CHLORIDE 0.9 % IV SOLN: INTRAVENOUS | Status: AC

## 2023-07-04 MED ORDER — OXYCODONE HCL 5 MG PO TABS
5.0000 mg | ORAL_TABLET | Freq: Four times a day (QID) | ORAL | Status: DC | PRN
  Administered 2023-07-04 (×2): 5 mg via ORAL
  Filled 2023-07-04 (×2): qty 1

## 2023-07-04 MED ORDER — OXYCODONE HCL ER 20 MG PO T12A
20.0000 mg | EXTENDED_RELEASE_TABLET | Freq: Two times a day (BID) | ORAL | Status: DC
  Administered 2023-07-04: 20 mg via ORAL
  Filled 2023-07-04: qty 2

## 2023-07-04 MED ORDER — PROCHLORPERAZINE EDISYLATE 10 MG/2ML IJ SOLN: 5.0000 mg | Freq: Four times a day (QID) | INTRAMUSCULAR | Status: DC | PRN

## 2023-07-04 MED ORDER — HYDROMORPHONE HCL 1 MG/ML IJ SOLN
0.5000 mg | INTRAMUSCULAR | Status: AC | PRN
  Administered 2023-07-04 (×3): 0.5 mg via INTRAVENOUS
  Filled 2023-07-04 (×2): qty 1
  Filled 2023-07-04: qty 0.5

## 2023-07-04 MED ORDER — OXYCODONE HCL 5 MG PO TABS
10.0000 mg | ORAL_TABLET | Freq: Four times a day (QID) | ORAL | Status: DC | PRN
  Administered 2023-07-05 – 2023-07-06 (×6): 10 mg via ORAL
  Filled 2023-07-04 (×8): qty 2

## 2023-07-04 MED ORDER — SENNA 8.6 MG PO TABS
1.0000 | ORAL_TABLET | Freq: Two times a day (BID) | ORAL | Status: DC
  Administered 2023-07-04 – 2023-07-05 (×2): 8.6 mg via ORAL
  Filled 2023-07-04 (×4): qty 1

## 2023-07-04 MED ORDER — LIDOCAINE 5 % EX PTCH
1.0000 | MEDICATED_PATCH | CUTANEOUS | Status: DC
  Administered 2023-07-04: 1 via TRANSDERMAL
  Filled 2023-07-04 (×2): qty 1

## 2023-07-04 MED ORDER — MELATONIN 5 MG PO TABS
5.0000 mg | ORAL_TABLET | Freq: Every evening | ORAL | Status: DC | PRN
  Filled 2023-07-04: qty 1

## 2023-07-04 MED ORDER — GADOBUTROL 1 MMOL/ML IV SOLN
9.0000 mL | Freq: Once | INTRAVENOUS | Status: AC | PRN
  Administered 2023-07-04: 9 mL via INTRAVENOUS

## 2023-07-04 MED ORDER — POLYETHYLENE GLYCOL 3350 17 G PO PACK: 17.0000 g | PACK | Freq: Every day | ORAL | Status: DC | PRN

## 2023-07-04 MED ORDER — MORPHINE SULFATE ER 15 MG PO TBCR
60.0000 mg | EXTENDED_RELEASE_TABLET | Freq: Two times a day (BID) | ORAL | Status: DC
  Administered 2023-07-04 – 2023-07-06 (×3): 60 mg via ORAL
  Filled 2023-07-04 (×4): qty 4

## 2023-07-04 MED ORDER — DEXAMETHASONE 4 MG PO TABS
4.5000 mg | ORAL_TABLET | Freq: Every day | ORAL | Status: DC
  Administered 2023-07-04 – 2023-07-06 (×3): 4.5 mg via ORAL
  Filled 2023-07-04 (×3): qty 1

## 2023-07-04 MED ORDER — VITAMIN D (ERGOCALCIFEROL) 1.25 MG (50000 UNIT) PO CAPS: 50000.0000 [IU] | ORAL_CAPSULE | ORAL | Status: DC

## 2023-07-04 MED ORDER — ACETAMINOPHEN 325 MG PO TABS
650.0000 mg | ORAL_TABLET | Freq: Four times a day (QID) | ORAL | Status: DC | PRN
  Administered 2023-07-04: 650 mg via ORAL
  Filled 2023-07-04: qty 2

## 2023-07-04 NOTE — ED Notes (Signed)
ED TO INPATIENT HANDOFF REPORT  ED Nurse Name and Phone #: Tamica Covell [phone]  S Name/Age/Gender Kaitlin Baker 67 y.o. female Room/Bed: 004C/004C  Code Status   Code Status: Limited: Do not attempt resuscitation (DNR) -DNR-LIMITED -Do Not Intubate/DNI   Home/SNF/Other Home Patient oriented to: self, place, time, and situation Is this baseline? Yes   Triage Complete: Triage complete  Chief Complaint Pathological fracture [M84.40XA]  Triage Note Pt here from home with c/o left leg pain she felt a snap in her leg while walking pt has known bone CA    Allergies Allergies  Allergen Reactions   Codeine Nausea And Vomiting    Level of Care/Admitting Diagnosis ED Disposition     ED Disposition  Admit   Condition  --   Comment  Hospital Area: [facility] [[id number]]  Level of Care: Telemetry Surgical [105]  May admit patient to [facility] or [facility] if equivalent level of care is available:: Yes  Covid Evaluation: Asymptomatic - no recent exposure (last 10 days) testing not required  Diagnosis: Pathological fracture [454098]  Admitting Physician: Darlin Drop [[id number]]  Attending Physician: Darlin Drop [[id number]]  Certification:: I certify this patient will need inpatient services for at least 2 midnights  Expected Medical Readiness: 07/06/2023          B Medical/Surgery History Past Medical History:  Diagnosis Date   Arthritis    Breast cancer (HCC)    Family history of breast cancer    High cholesterol    PONV (postoperative nausea and vomiting)    Past Surgical History:  Procedure Laterality Date   CHOLECYSTECTOMY     HUMERUS IM NAIL Right 01/22/2023   Procedure: INTRAMEDULLARY (IM) NAIL HUMERUS;  Surgeon: Yolonda Kida, MD;  Location: [facility];  Service: Orthopedics;  Laterality: Right;  90   neck surgery     plate placed     A IV Location/Drains/Wounds Patient Lines/Drains/Airways Status     Active Line/Drains/Airways      Name Placement date Placement time Site Days   Peripheral IV 07/04/23 22 G Anterior;Proximal;Right Forearm 07/04/23  0107  Forearm  less than 1            Intake/Output Last 24 hours No intake or output data in the 24 hours ending 07/04/23 1113  Labs/Imaging Results for orders placed or performed during the hospital encounter of 07/03/23 (from the past 48 hour(s))  CBC with Differential     Status: Abnormal   Collection Time: 07/04/23  1:05 AM  Result Value Ref Range   WBC 11.3 (H) 4.0 - 10.5 K/uL   RBC 4.41 3.87 - 5.11 MIL/uL   Hemoglobin 12.3 12.0 - 15.0 g/dL   HCT 08.6 57.8 - 46.9 %   MCV 87.5 80.0 - 100.0 fL   MCH 27.9 26.0 - 34.0 pg   MCHC 31.9 30.0 - 36.0 g/dL   RDW 62.9 (H) 52.8 - 41.3 %   Platelets 269 150 - 400 K/uL   nRBC 0.0 0.0 - 0.2 %   Neutrophils Relative % 74 %   Neutro Abs 8.4 (H) 1.7 - 7.7 K/uL   Lymphocytes Relative 19 %   Lymphs Abs 2.1 0.7 - 4.0 K/uL   Monocytes Relative 5 %   Monocytes Absolute 0.6 0.1 - 1.0 K/uL   Eosinophils Relative 0 %   Eosinophils Absolute 0.0 0.0 - 0.5 K/uL   Basophils Relative 0 %   Basophils Absolute 0.0 0.0 - 0.1 K/uL  Immature Granulocytes 2 %   Abs Immature Granulocytes 0.25 (H) 0.00 - 0.07 K/uL    Comment: Performed at [facility], [address], [place], [place] [address]  Comprehensive metabolic panel     Status: Abnormal   Collection Time: 07/04/23  1:05 AM  Result Value Ref Range   Sodium 141 135 - 145 mmol/L   Potassium 3.9 3.5 - 5.1 mmol/L   Chloride 103 98 - 111 mmol/L   CO2 28 22 - 32 mmol/L   Glucose, Bld 96 70 - 99 mg/dL    Comment: Glucose reference range applies only to samples taken after fasting for at least 8 hours.   BUN 22 8 - 23 mg/dL   Creatinine, Ser 6.04 0.44 - 1.00 mg/dL   Calcium 8.6 (L) 8.9 - 10.3 mg/dL   Total Protein 6.1 (L) 6.5 - 8.1 g/dL   Albumin 2.9 (L) 3.5 - 5.0 g/dL   AST 18 15 - 41 U/L   ALT 19 0 - 44 U/L   Alkaline Phosphatase 334 (H) 38 - 126 U/L   Total  Bilirubin 0.4 0.3 - 1.2 mg/dL   GFR, Estimated >54 >09 mL/min    Comment: (NOTE) Calculated using the CKD-EPI Creatinine Equation (2021)    Anion gap 10 5 - 15    Comment: Performed at [facility], [address], [place], [place] [address]  CBC     Status: Abnormal   Collection Time: 07/04/23  4:15 AM  Result Value Ref Range   WBC 11.3 (H) 4.0 - 10.5 K/uL   RBC 4.39 3.87 - 5.11 MIL/uL   Hemoglobin 12.2 12.0 - 15.0 g/dL   HCT 47.8 29.5 - 62.1 %   MCV 87.7 80.0 - 100.0 fL   MCH 27.8 26.0 - 34.0 pg   MCHC 31.7 30.0 - 36.0 g/dL   RDW 30.8 (H) 65.7 - 84.6 %   Platelets 262 150 - 400 K/uL   nRBC 0.0 0.0 - 0.2 %    Comment: Performed at [facility], [address] [place], [place] [address]  Basic metabolic panel     Status: None   Collection Time: 07/04/23  4:15 AM  Result Value Ref Range   Sodium 139 135 - 145 mmol/L   Potassium 4.7 3.5 - 5.1 mmol/L   Chloride 101 98 - 111 mmol/L   CO2 29 22 - 32 mmol/L   Glucose, Bld 92 70 - 99 mg/dL    Comment: Glucose reference range applies only to samples taken after fasting for at least 8 hours.   BUN 22 8 - 23 mg/dL   Creatinine, Ser 2.84 0.44 - 1.00 mg/dL   Calcium 9.0 8.9 - 13.2 mg/dL   GFR, Estimated >44 >01 mL/min    Comment: (NOTE) Calculated using the CKD-EPI Creatinine Equation (2021)    Anion gap 9 5 - 15    Comment: Performed at [facility], [address], [place], [place] [address]  Magnesium     Status: None   Collection Time: 07/04/23  4:15 AM  Result Value Ref Range   Magnesium 2.2 1.7 - 2.4 mg/dL    Comment: Performed at [facility], [address] [place], [place] [address]  Phosphorus     Status: None   Collection Time: 07/04/23  4:15 AM  Result Value Ref Range   Phosphorus 3.3 2.5 - 4.6 mg/dL    Comment: Performed at [facility], [address] [place], [place] [address]   CT FEMUR LEFT WO CONTRAST  Result Date: 07/04/2023 CLINICAL DATA:  Metastatic disease evaluation  surgical planning EXAM: CT OF THE LOWER LEFT EXTREMITY WITHOUT CONTRAST TECHNIQUE: Multidetector CT imaging of the lower left extremity was performed according to the standard protocol. RADIATION DOSE REDUCTION: This exam was performed according to the departmental dose-optimization program which includes automated exposure control, adjustment of the mA and/or kV according to patient size and/or use of iterative reconstruction technique. COMPARISON:  X-ray left hip 07/03/2023, CT chest 05/04/2023 FINDINGS: Bones/Joint/Cartilage Acute nondisplaced pathologic fracture through the lesser trochanter (6:72). No dislocation or joint effusion. No evidence of severe arthropathy. Diffuse axial and appendicular skeleton lytic and blastic osseous metastases. Ligaments Suboptimally assessed by CT. Muscles and Tendons Grossly unremarkable. Soft tissues Unremarkable. Other: None IMPRESSION: 1. Diffuse axial and appendicular skeleton lytic and blastic osseous metastases. 2. Acute nondisplaced pathologic fracture through the lesser trochanter. Electronically Signed   By: Tish Frederickson M.D.   On: 07/04/2023 03:07   DG Femur Min 2 Views Left  Result Date: 07/03/2023 CLINICAL DATA:  Left femur pain. History of stage IV breast cancer with bone metastasis. EXAM: LEFT FEMUR 2 VIEWS COMPARISON:  Whole-body bone scan 09/18/2022 FINDINGS: There is a lucent lesion in the proximal femur extending from the subtrochanteric region into the proximal femoral shaft. Lesion spans approximately 10 mm. There is mild cortical scalloping with a pathologic fracture through the lateral cortex. The distal femur is intact. Minimal degenerative change about the knee and hip. IMPRESSION: Lucent lesion in the proximal femur extending from the subtrochanteric region into the proximal femoral shaft with pathologic fracture through the lateral cortex. Findings are highly suspicious for metastatic disease. Electronically Signed   By: Narda Rutherford M.D.    On: 07/03/2023 20:51    Pending Labs Unresulted Labs (From admission, onward)    None       Vitals/Pain Today's Vitals   07/04/23 0731 07/04/23 0848 07/04/23 1032 07/04/23 1110  BP:   125/82   Pulse:   74   Resp:   20   Temp:    98.3 F (36.8 C)  TempSrc:    Oral  SpO2:   97%   PainSc: 4  4       Isolation Precautions No active isolations  Medications Medications  HYDROmorphone (DILAUDID) injection 0.5 mg (0.5 mg Intravenous Given 07/04/23 0848)  acetaminophen (TYLENOL) tablet 650 mg (650 mg Oral Given 07/04/23 0739)  prochlorperazine (COMPAZINE) injection 5 mg (has no administration in time range)  oxyCODONE (Oxy IR/ROXICODONE) immediate release tablet 5 mg (5 mg Oral Given 07/04/23 0739)  polyethylene glycol (MIRALAX / GLYCOLAX) packet 17 g (has no administration in time range)  melatonin tablet 5 mg (has no administration in time range)  Vitamin D (Ergocalciferol) (DRISDOL) 1.25 MG (50000 UNIT) capsule 50,000 Units (has no administration in time range)  0.9 %  sodium chloride infusion ( Intravenous New Bag/Given 07/04/23 0742)  oxyCODONE (OXYCONTIN) 12 hr tablet 20 mg (20 mg Oral Given 07/04/23 0507)  senna (SENOKOT) tablet 8.6 mg (8.6 mg Oral Given 07/04/23 1022)  morphine (MS CONTIN) 12 hr tablet 60 mg (60 mg Oral Given 07/03/23 1756)  oxyCODONE (Oxy IR/ROXICODONE) immediate release tablet 10 mg (10 mg Oral Given 07/03/23 1933)  gadobutrol (GADAVIST) 1 MMOL/ML injection 9 mL (9 mLs Intravenous Contrast Given 07/04/23 1014)    Mobility walks     Focused Assessments Musculoskeletal: fx left hip, NWB on LLE  R Recommendations: See Admitting Provider Note  Report given to:   Additional Notes: none

## 2023-07-04 NOTE — ED Provider Notes (Signed)
EMERGENCY DEPARTMENT AT [facility] Provider Note  CSN: [id number] Arrival date & time: 07/03/23 1734  Chief Complaint(s) Leg Injury  HPI Kaitlin Baker is a 67 y.o. female with a past medical history listed below including metastatic breast cancer with mets to the bones and known mets to the left femur here with concern for fracture of the left femur.  Patient reports that while walking up the steps, she applied pressure to the left leg at which time she felt and heard it snap.  She did not fall and was able to lower herself.  She has had constant pain since that time worse with palpation and range of motion.   The history is provided by the patient.    Past Medical History Past Medical History:  Diagnosis Date   Arthritis    Breast cancer (HCC)    Family history of breast cancer    High cholesterol    PONV (postoperative nausea and vomiting)    Patient Active Problem List   Diagnosis Date Noted   Mild protein-calorie malnutrition (HCC) 05/07/2023   Palliative care patient 05/07/2023   Cancer related pain 05/05/2023   Primary breast cancer with metastasis to other site [facility]) 05/04/2023   Drug-induced constipation 05/04/2023   Genetic testing 02/04/2021   Family history of breast cancer    Malignant neoplasm metastatic to bone (HCC) 07/17/2020   Primary malignant neoplasm of breast with metastasis (HCC) 06/17/2020   Home Medication(s) Prior to Admission medications   Medication Sig Start Date End Date Taking? Authorizing Provider  acetaminophen (TYLENOL) 500 MG tablet Take 2 tablets (1,000 mg total) by mouth 3 (three) times daily. 05/07/23   Masters, Katie, DO  capsaicin (ZOSTRIX) 0.025 % cream Apply topically 2 (two) times daily as needed (pain). 05/07/23   Masters, Florentina Addison, DO  elacestrant hydrochloride (ORSERDU) 345 MG tablet Take 1 tablet (345 mg total) by mouth daily. Take with food. 06/08/23   [name], MD  ergocalciferol (VITAMIN D2) 1.25  MG (50000 UT) capsule Take 1 capsule (50,000 Units total) by mouth once a week. Patient taking differently: Take 50,000 Units by mouth every Sunday. 10/07/22   [name], MD  lidocaine (LIDODERM) 5 % Place 1 patch onto the skin daily. Remove & Discard patch within 12 hours or as directed by MD 05/07/23   Masters, Florentina Addison, DO  oxyCODONE (OXY IR/ROXICODONE) 5 MG immediate release tablet Take 1 tablet (5 mg total) by mouth every 6 (six) hours as needed for severe pain, breakthrough pain or moderate pain. 05/24/23   [name], MD  oxyCODONE (OXYCONTIN) 20 mg 12 hr tablet Take 1 tablet (20 mg total) by mouth every 12 (twelve) hours. 05/28/23   [name], MD  polyethylene glycol (MIRALAX / GLYCOLAX) 17 g packet Take 17 g by mouth 2 (two) times daily. 05/07/23   Masters, Florentina Addison, DO  prochlorperazine (COMPAZINE) 10 MG tablet Take 1 tablet (10 mg total) by mouth every 6 (six) hours as needed for nausea or vomiting. 02/15/23   [name], MD  senna (SENOKOT) 8.6 MG TABS tablet Take 1 tablet (8.6 mg total) by mouth 2 (two) times daily. 05/07/23   Masters, Florentina Addison, DO  Allergies Codeine  Review of Systems Review of Systems As noted in HPI  Physical Exam Vital Signs  I have reviewed the triage vital signs BP 138/85 (BP Location: Left Arm)   Pulse 66   Temp 98.4 F (36.9 C)   Resp 19   SpO2 100%   Physical Exam Vitals reviewed.  Constitutional:      General: She is not in acute distress.    Appearance: She is well-developed. She is not diaphoretic.  HENT:     Head: Normocephalic and atraumatic.     Right Ear: External ear normal.     Left Ear: External ear normal.     Nose: Nose normal.  Eyes:     General: No scleral icterus.       Right eye: No discharge.        Left eye: No discharge.     Conjunctiva/sclera: Conjunctivae normal.      Pupils: Pupils are equal, round, and reactive to light.  Neck:     Trachea: Phonation normal.  Cardiovascular:     Rate and Rhythm: Normal rate and regular rhythm.     Pulses:          Radial pulses are 2+ on the right side and 2+ on the left side.       Dorsalis pedis pulses are 2+ on the right side and 2+ on the left side.     Heart sounds: Normal heart sounds. No murmur heard.    No friction rub. No gallop.  Pulmonary:     Effort: Pulmonary effort is normal. No respiratory distress.     Breath sounds: Normal breath sounds. No stridor. No wheezing.  Abdominal:     General: There is no distension.     Palpations: Abdomen is soft.     Tenderness: There is no abdominal tenderness.  Musculoskeletal:        General: Normal range of motion.     Cervical back: Normal range of motion and neck supple. No bony tenderness.     Thoracic back: No bony tenderness.     Lumbar back: No bony tenderness.     Left upper leg: Tenderness present.       Legs:     Comments: Clavicles stable. Chest stable to AP/Lat compression. Pelvis stable to Lat compression. No obvious extremity deformity. No chest or abdominal wall contusion.  Skin:    General: Skin is warm and dry.     Findings: No erythema or rash.  Neurological:     Mental Status: She is alert and oriented to person, place, and time.     Comments: Moving all extremities  Psychiatric:        Behavior: Behavior normal.     ED Results and Treatments Labs (all labs ordered are listed, but only abnormal results are displayed) Labs Reviewed  CBC WITH DIFFERENTIAL/PLATELET - Abnormal; Notable for the following components:      Result Value   WBC 11.3 (*)    RDW 15.9 (*)    Neutro Abs 8.4 (*)    Abs Immature Granulocytes 0.25 (*)    All other components within normal limits  COMPREHENSIVE METABOLIC PANEL - Abnormal; Notable for the following components:   Calcium 8.6 (*)    Total Protein 6.1 (*)    Albumin 2.9 (*)    Alkaline  Phosphatase 334 (*)    All other components within normal limits  EKG  EKG Interpretation Date/Time:    Ventricular Rate:    PR Interval:    QRS Duration:    QT Interval:    QTC Calculation:   R Axis:      Text Interpretation:         Radiology DG Femur Min 2 Views Left  Result Date: 07/03/2023 CLINICAL DATA:  Left femur pain. History of stage IV breast cancer with bone metastasis. EXAM: LEFT FEMUR 2 VIEWS COMPARISON:  Whole-body bone scan 09/18/2022 FINDINGS: There is a lucent lesion in the proximal femur extending from the subtrochanteric region into the proximal femoral shaft. Lesion spans approximately 10 mm. There is mild cortical scalloping with a pathologic fracture through the lateral cortex. The distal femur is intact. Minimal degenerative change about the knee and hip. IMPRESSION: Lucent lesion in the proximal femur extending from the subtrochanteric region into the proximal femoral shaft with pathologic fracture through the lateral cortex. Findings are highly suspicious for metastatic disease. Electronically Signed   By: Narda Rutherford M.D.   On: 07/03/2023 20:51    Medications Ordered in ED Medications  HYDROmorphone (DILAUDID) injection 0.5 mg (0.5 mg Intravenous Given 07/04/23 0132)  morphine (MS CONTIN) 12 hr tablet 60 mg (60 mg Oral Given 07/03/23 1756)  oxyCODONE (Oxy IR/ROXICODONE) immediate release tablet 10 mg (10 mg Oral Given 07/03/23 1933)   Procedures Procedures  (including critical care time) Medical Decision Making / ED Course   Medical Decision Making Amount and/or Complexity of Data Reviewed Labs: ordered. Decision-making details documented in ED Course. Radiology: ordered and independent interpretation performed. Decision-making details documented in ED Course.  Risk Prescription drug management. Parenteral controlled  substances. Decision regarding hospitalization.    Patient is here with left thigh pain with known bone mets.  Concern for pathologic fracture.  This was confirmed by x-ray.  CBC with leukocytosis likely stress-induced from pain.  No anemia. Metabolic panel without significant electrolyte derangements or renal sufficiency.  Elevated alk phos likely from bone mets.  Patient is unsure whether she wants surgical and nonsurgical management. Patient will require admission.  Will have orthopedic surgery consult and provide patient with options. Recommended CT and MRI. Medicine admission.      Final Clinical Impression(s) / ED Diagnoses Final diagnoses:  Pathological fracture of left femur due to neoplastic disease, initial encounter [facility])    This chart was dictated using voice recognition software.  Despite best efforts to proofread,  errors can occur which can change the documentation meaning.    Nira Conn, MD 07/04/23 Earle Gell

## 2023-07-04 NOTE — Progress Notes (Signed)
CT and MRI scans suggests extensive acetabular and pelvic involvement on the left, as well as right femur. Consequently will contact my [facility] colleague, Dr. Vilinda Blanks at [facility], in the am regarding possible transfer of care.  Myrene Galas, MD [facility], [facility] [phone]

## 2023-07-04 NOTE — Progress Notes (Signed)
Verbal order heart healthy diet and NPO at midnight 07/05/23

## 2023-07-04 NOTE — ED Notes (Signed)
Patient transported to MRI 

## 2023-07-04 NOTE — ED Notes (Signed)
Hospitalist at bedside (Dr. Margo Aye)

## 2023-07-04 NOTE — Progress Notes (Signed)
Brief progress note: -Patient was admitted earlier today. -As per H&P done on presentation: "Kaitlin Baker is a 67 y.o. 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  She felt a snap in her left leg while walking up the steps.  She did not fall and was able to lower herself down.  The pain has been constant since.  Worse with palpation and movement.   In the ED, vital signs are stable.  Left leg x-ray showed lucent lesion in the proximal femur extending from the subtrochanteric region into the proximal femoral shaft with pathologic fracture through the lateral cortex.  Findings are highly suspicious for metastatic disease.   The patient received IV opioid-based analgesics for her left femur pathological fracture.  EDP discussed the case with orthopedic surgery, will see in consultation.   Admitted by [facility], hospitalist service.   ED Course: Temperature 98.4.  BP 138/85, pulse 66, respiratory rate 19, saturation 100% on room air.  Lab studies notable for calcium 8.6, alkaline phosphatase 2034.  Albumin 2.9.  WBC 11.3".   07/04/2023: Patient seen.  Communicated with orthopedic surgery team.  Likely, orthopedic surgery team will proceed with surgical procedure tomorrow.  Will resume cardiac diet.  N.p.o. at midnight.  No new complaints.  Pain is reasonably controlled.  Further management depend on hospital course.

## 2023-07-04 NOTE — H&P (Signed)
History and Physical  Kaitlin Baker NGE:[id number] DOB: 1956-02-25 DOA: 07/03/2023  Referring physician: Dr. Eudelia Bunch, EDP  PCP: Patient, No Pcp Per  Outpatient Specialists: Medical oncology. Patient coming from: Home  Chief Complaint: Pathological fracture  HPI: Kaitlin Baker is a 67 y.o. 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  She felt a snap in her left leg while walking up the steps.  She did not fall and was able to lower herself down.  The pain has been constant since.  Worse with palpation and movement.  In the ED, vital signs are stable.  Left leg x-ray showed lucent lesion in the proximal femur extending from the subtrochanteric region into the proximal femoral shaft with pathologic fracture through the lateral cortex.  Findings are highly suspicious for metastatic disease.  The patient received IV opioid-based analgesics for her left femur pathological fracture.  EDP discussed the case with orthopedic surgery, will see in consultation.  Admitted by [facility], hospitalist service.  ED Course: Temperature 98.4.  BP 138/85, pulse 66, respiratory rate 19, saturation 100% on room air.  Lab studies notable for calcium 8.6, alkaline phosphatase 2034.  Albumin 2.9.  WBC 11.3.  Review of Systems: Review of systems as noted in the HPI. All other systems reviewed and are negative.   Past Medical History:  Diagnosis Date   Arthritis    Breast cancer (HCC)    Family history of breast cancer    High cholesterol    PONV (postoperative nausea and vomiting)    Past Surgical History:  Procedure Laterality Date   CHOLECYSTECTOMY     HUMERUS IM NAIL Right 01/22/2023   Procedure: INTRAMEDULLARY (IM) NAIL HUMERUS;  Surgeon: Yolonda Kida, MD;  Location: [facility];  Service: Orthopedics;  Laterality:  Right;  90   neck surgery     plate placed    Social History:  reports that she has never smoked. She has never used smokeless tobacco. She reports that she does not drink alcohol and does not use drugs.   Allergies  Allergen Reactions   Codeine Nausea And Vomiting    Family History  Adopted: Yes  Problem Relation Age of Onset   Breast cancer Mother        dx 65s   Brain cancer Father        vs brain tumor, d. 47   Breast cancer Sister        dx 60   Breast cancer Sister        dx 71s   Liver cancer Brother    Alcohol abuse Brother    Asthma Son       Prior to Admission medications   Medication Sig Start Date End Date Taking? Authorizing Provider  acetaminophen (TYLENOL) 500 MG tablet Take 2 tablets (1,000 mg total) by mouth 3 (three) times daily. 05/07/23   Masters, Katie, DO  capsaicin (ZOSTRIX) 0.025 % cream Apply topically 2 (two) times daily as needed (pain). 05/07/23   Masters, Florentina Addison, DO  elacestrant hydrochloride (ORSERDU) 345 MG tablet Take 1 tablet (345 mg total) by mouth daily. Take with food. 06/08/23   [name], MD  ergocalciferol (VITAMIN D2) 1.25 MG (50000 UT) capsule Take 1 capsule (50,000 Units total) by mouth once a week. Patient taking differently: Take 50,000 Units by mouth every Sunday. 10/07/22  [name], MD  lidocaine (LIDODERM) 5 % Place 1 patch onto the skin daily. Remove & Discard patch within 12 hours or as directed by MD 05/07/23   Masters, Florentina Addison, DO  oxyCODONE (OXY IR/ROXICODONE) 5 MG immediate release tablet Take 1 tablet (5 mg total) by mouth every 6 (six) hours as needed for severe pain, breakthrough pain or moderate pain. 05/24/23   [name], MD  oxyCODONE (OXYCONTIN) 20 mg 12 hr tablet Take 1 tablet (20 mg total) by mouth every 12 (twelve) hours. 05/28/23   [name], MD  polyethylene glycol (MIRALAX / GLYCOLAX) 17 g packet Take 17 g by mouth 2 (two) times daily. 05/07/23   Masters, Florentina Addison, DO   prochlorperazine (COMPAZINE) 10 MG tablet Take 1 tablet (10 mg total) by mouth every 6 (six) hours as needed for nausea or vomiting. 02/15/23   [name], MD  senna (SENOKOT) 8.6 MG TABS tablet Take 1 tablet (8.6 mg total) by mouth 2 (two) times daily. 05/07/23   Masters, Florentina Addison, DO    Physical Exam: BP 138/85 (BP Location: Left Arm)   Pulse 66   Temp 98.4 F (36.9 C)   Resp 19   SpO2 100%   General: 67 y.o. year-old female well developed well nourished in no acute distress.  Alert and oriented x3. Cardiovascular: Regular rate and rhythm with no rubs or gallops.  No thyromegaly or JVD noted.  No lower extremity edema. 2/4 pulses in all 4 extremities. Respiratory: Clear to auscultation with no wheezes or rales. Good inspiratory effort. Abdomen: Soft nontender nondistended with normal bowel sounds x4 quadrants. Muskuloskeletal: No cyanosis, clubbing or edema noted bilaterally Neuro: CN II-XII intact, strength, sensation, reflexes Skin: No ulcerative lesions noted or rashes Psychiatry: Judgement and insight appear normal. Mood is appropriate for condition and setting          Labs on Admission:  Basic Metabolic Panel: Recent Labs  Lab 07/04/23 0105  NA 141  K 3.9  CL 103  CO2 28  GLUCOSE 96  BUN 22  CREATININE 0.79  CALCIUM 8.6*   Liver Function Tests: Recent Labs  Lab 07/04/23 0105  AST 18  ALT 19  ALKPHOS 334*  BILITOT 0.4  PROT 6.1*  ALBUMIN 2.9*   No results for input(s): "LIPASE", "AMYLASE" in the last 168 hours. No results for input(s): "AMMONIA" in the last 168 hours. CBC: Recent Labs  Lab 07/04/23 0105  WBC 11.3*  NEUTROABS 8.4*  HGB 12.3  HCT 38.6  MCV 87.5  PLT 269   Cardiac Enzymes: No results for input(s): "CKTOTAL", "CKMB", "CKMBINDEX", "TROPONINI" in the last 168 hours.  BNP (last 3 results) No results for input(s): "BNP" in the last 8760 hours.  ProBNP (last 3 results) No results for input(s): "PROBNP" in the last 8760  hours.  CBG: No results for input(s): "GLUCAP" in the last 168 hours.  Radiological Exams on Admission: DG Femur Min 2 Views Left  Result Date: 07/03/2023 CLINICAL DATA:  Left femur pain. History of stage IV breast cancer with bone metastasis. EXAM: LEFT FEMUR 2 VIEWS COMPARISON:  Whole-body bone scan 09/18/2022 FINDINGS: There is a lucent lesion in the proximal femur extending from the subtrochanteric region into the proximal femoral shaft. Lesion spans approximately 10 mm. There is mild cortical scalloping with a pathologic fracture through the lateral cortex. The distal femur is intact. Minimal degenerative change about the knee and hip. IMPRESSION: Lucent lesion in the proximal femur extending from the subtrochanteric region into the proximal  femoral shaft with pathologic fracture through the lateral cortex. Findings are highly suspicious for metastatic disease. Electronically Signed   By: Narda Rutherford M.D.   On: 07/03/2023 20:51    EKG: I independently viewed the EKG done and my findings are as followed: None available at the time of this visit.  Assessment/Plan Present on Admission:  Pathological fracture  Principal Problem:   Pathological fracture  Left femur pathological fracture, POA Noted fracture while walking up the steps, no falls As needed analgesics and bowel regimen as needed Orthopedic surgery will see in consultation N.p.o. until seen by orthopedic surgery Gentle IV hydration NS at 40 cc/h x 1 day. Continue fall precautions  Metastasized breast cancer to the bones Follows with medical oncology outpatient  Cancer related pain, on chronic opioid use Resume home OxyContin 20 mg twice daily As needed oxycodone and IV Dilaudid for breakthrough pain As needed and scheduled bowel regimen  Elevated alkaline phosphatase in the setting of bone metastasis Alkaline phosphatase 334 Monitor for now  Leukocytosis, suspect reactive in the setting of bone  fracture Nonseptic appearing and afebrile Monitor for now   Time: 75 minutes.   DVT prophylaxis: SCDs due to possible surgery.  Defer chemical DVT prophylaxis to orthopedic surgery.  Code Status: DNR  Family Communication: None at bedside  Disposition Plan: Admitted to telemetry surgical unit  Consults called: Orthopedic surgery consulted by EDP  Admission status: Inpatient status.   Status is: Inpatient The patient requires at least 2 midnights for further evaluation and treatment of present condition.   Darlin Drop MD [facility] Pager [phone]  If 7PM-7AM, please contact night-coverage [email or web address] Password [email or web address]  07/04/2023, 2:56 AM

## 2023-07-04 NOTE — Consult Note (Signed)
ORTHOPAEDIC CONSULTATION  REQUESTING PHYSICIAN: Barnetta Chapel, MD  PCP:  Patient, No Pcp Per  Chief Complaint: left hip pain.   HPI: EVILYN Baker is a 67 y.o. 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  She felt a snap in her left leg while walking up the steps.  She did not fall and was able to lower herself down.  The pain has been constant since.  Worse with palpation and movement.   Patient is currently under hospice care. She states she does not normally ambulate with assistive devices.   Past Medical History:  Diagnosis Date   Arthritis    Breast cancer (HCC)    Family history of breast cancer    High cholesterol    PONV (postoperative nausea and vomiting)    Past Surgical History:  Procedure Laterality Date   CHOLECYSTECTOMY     HUMERUS IM NAIL Right 01/22/2023   Procedure: INTRAMEDULLARY (IM) NAIL HUMERUS;  Surgeon: Yolonda Kida, MD;  Location: [facility];  Service: Orthopedics;  Laterality: Right;  90   neck surgery     plate placed   Social History   Socioeconomic History   Marital status: Unknown    Spouse name: Not on file   Number of children: 2   Years of education: Not on file   Highest education level: Not on file  Occupational History   Occupation: retired  Tobacco Use   Smoking status: Never   Smokeless tobacco: Never  Vaping Use   Vaping status: Never Used  Substance and Sexual Activity   Alcohol use: Never   Drug use: Never   Sexual activity: Not Currently  Other Topics Concern   Not on file  Social History Narrative   Not on file   Social Determinants of Health   Financial Resource Strain: Low Risk  (07/17/2020)   Overall Financial Resource Strain (CARDIA)    Difficulty of Paying Living Expenses: Not hard at all  Food Insecurity: No Food  Insecurity (05/04/2023)   Hunger Vital Sign    Worried About Running Out of Food in the Last Year: Never true    Ran Out of Food in the Last Year: Never true  Transportation Needs: No Transportation Needs (05/04/2023)   PRAPARE - Administrator, Civil Service (Medical): No    Lack of Transportation (Non-Medical): No  Physical Activity: Inactive (07/17/2020)   Exercise Vital Sign    Days of Exercise per Week: 0 days    Minutes of Exercise per Session: 0 min  Stress: No Stress Concern Present (07/17/2020)   [employer] of Occupational Health - Occupational Stress Questionnaire    Feeling of Stress : Not at all  Social Connections: Unknown (05/20/2023)   Received from [facility]   Social Network    Social Network: Not on file   Family History  Adopted: Yes  Problem Relation Age of Onset   Breast cancer Mother        dx 53s   Brain cancer Father        vs brain tumor, d. 3   Breast cancer Sister        dx 74   Breast cancer Sister        dx 17s   Liver cancer Brother    Alcohol abuse Brother  Asthma Son    Allergies  Allergen Reactions   Codeine Nausea And Vomiting   Prior to Admission medications   Medication Sig Start Date End Date Taking? Authorizing Provider  acetaminophen (TYLENOL) 500 MG tablet Take 2 tablets (1,000 mg total) by mouth 3 (three) times daily. 05/07/23   Masters, Katie, DO  capsaicin (ZOSTRIX) 0.025 % cream Apply topically 2 (two) times daily as needed (pain). 05/07/23   Masters, Florentina Addison, DO  elacestrant hydrochloride (ORSERDU) 345 MG tablet Take 1 tablet (345 mg total) by mouth daily. Take with food. 06/08/23   [name], MD  ergocalciferol (VITAMIN D2) 1.25 MG (50000 UT) capsule Take 1 capsule (50,000 Units total) by mouth once a week. 10/07/22   [name], MD  lidocaine (LIDODERM) 5 % Place 1 patch onto the skin daily. Remove & Discard patch within 12 hours or as directed by MD 05/07/23   Masters, Florentina Addison, DO  oxyCODONE  (OXY IR/ROXICODONE) 5 MG immediate release tablet Take 1 tablet (5 mg total) by mouth every 6 (six) hours as needed for severe pain, breakthrough pain or moderate pain. 05/24/23   [name], MD  oxyCODONE (OXYCONTIN) 20 mg 12 hr tablet Take 1 tablet (20 mg total) by mouth every 12 (twelve) hours. 05/28/23   [name], MD  oxyCODONE-acetaminophen (PERCOCET/ROXICET) 5-325 MG tablet Take 1 tablet by mouth every 6 (six) hours as needed.    [provider]  polyethylene glycol (MIRALAX / GLYCOLAX) 17 g packet Take 17 g by mouth 2 (two) times daily. 05/07/23   Masters, Florentina Addison, DO  prochlorperazine (COMPAZINE) 10 MG tablet Take 1 tablet (10 mg total) by mouth every 6 (six) hours as needed for nausea or vomiting. 02/15/23   [name], MD  senna (SENOKOT) 8.6 MG TABS tablet Take 1 tablet (8.6 mg total) by mouth 2 (two) times daily. 05/07/23   Masters, Florentina Addison, DO   CT FEMUR LEFT WO CONTRAST  Result Date: 07/04/2023 CLINICAL DATA:  Metastatic disease evaluation surgical planning EXAM: CT OF THE LOWER LEFT EXTREMITY WITHOUT CONTRAST TECHNIQUE: Multidetector CT imaging of the lower left extremity was performed according to the standard protocol. RADIATION DOSE REDUCTION: This exam was performed according to the departmental dose-optimization program which includes automated exposure control, adjustment of the mA and/or kV according to patient size and/or use of iterative reconstruction technique. COMPARISON:  X-ray left hip 07/03/2023, CT chest 05/04/2023 FINDINGS: Bones/Joint/Cartilage Acute nondisplaced pathologic fracture through the lesser trochanter (6:72). No dislocation or joint effusion. No evidence of severe arthropathy. Diffuse axial and appendicular skeleton lytic and blastic osseous metastases. Ligaments Suboptimally assessed by CT. Muscles and Tendons Grossly unremarkable. Soft tissues Unremarkable. Other: None IMPRESSION: 1. Diffuse axial and appendicular skeleton lytic  and blastic osseous metastases. 2. Acute nondisplaced pathologic fracture through the lesser trochanter. Electronically Signed   By: Tish Frederickson M.D.   On: 07/04/2023 03:07   DG Femur Min 2 Views Left  Result Date: 07/03/2023 CLINICAL DATA:  Left femur pain. History of stage IV breast cancer with bone metastasis. EXAM: LEFT FEMUR 2 VIEWS COMPARISON:  Whole-body bone scan 09/18/2022 FINDINGS: There is a lucent lesion in the proximal femur extending from the subtrochanteric region into the proximal femoral shaft. Lesion spans approximately 10 mm. There is mild cortical scalloping with a pathologic fracture through the lateral cortex. The distal femur is intact. Minimal degenerative change about the knee and hip. IMPRESSION: Lucent lesion in the proximal femur extending from the subtrochanteric region into the proximal femoral shaft with  pathologic fracture through the lateral cortex. Findings are highly suspicious for metastatic disease. Electronically Signed   By: Narda Rutherford M.D.   On: 07/03/2023 20:51    Positive ROS: All other systems have been reviewed and were otherwise negative with the exception of those mentioned in the HPI and as above.  Physical Exam: General: Alert, no acute distress Cardiovascular: No pedal edema Respiratory: No cyanosis, no use of accessory musculature GI: No organomegaly, abdomen is soft and non-tender Skin: No lesions in the area of chief complaint Neurologic: Sensation intact distally Psychiatric: Patient is competent for consent with normal mood and affect Lymphatic: No axillary or cervical lymphadenopathy  MUSCULOSKELETAL:  Pannicular fold with erythema on the left side.   Examination of the left hip reveals no skin wounds or lesions. TTP over trochanteric region and anterior thigh.   Sensory and motor function intact in LE bilaterally including plantar flexion, dorsiflexion, and EHL. Distal pedal pulses 2+ bilaterally. Calves soft and non-tender.    Assessment: Acute nondisplaced pathologic left proximal femur fracture.  Metastasized breast cancer to bones.    Plan: I discussed the findings with the patient. She has an acute nondisplaced left proximal femur fracture. We discussed operative vs non-operative management. Patient elects to move forward with operative management to allow for mobilization out of bed and pain control. [facility] has already been consulted for perioperative medical optimization. CT and MRI ordered for surgical planning, MRI still pending. Patient currently NPO, continue to hold chemical DVT ppx. Surgical scheduling pending.    Clois Dupes, PA-C    07/04/2023 8:30 AM

## 2023-07-05 DIAGNOSIS — M84552A Pathological fracture in neoplastic disease, left femur, initial encounter for fracture: Secondary | ICD-10-CM | POA: Diagnosis not present

## 2023-07-05 LAB — CBC WITH DIFFERENTIAL/PLATELET
Abs Immature Granulocytes: 0.19 10*3/uL — ABNORMAL HIGH (ref 0.00–0.07)
Basophils Absolute: 0 10*3/uL (ref 0.0–0.1)
Basophils Relative: 0 %
Eosinophils Absolute: 0 10*3/uL (ref 0.0–0.5)
Eosinophils Relative: 0 %
HCT: 42.6 % (ref 36.0–46.0)
Hemoglobin: 13.6 g/dL (ref 12.0–15.0)
Immature Granulocytes: 2 %
Lymphocytes Relative: 13 %
Lymphs Abs: 1.2 10*3/uL (ref 0.7–4.0)
MCH: 27.9 pg (ref 26.0–34.0)
MCHC: 31.9 g/dL (ref 30.0–36.0)
MCV: 87.5 fL (ref 80.0–100.0)
Monocytes Absolute: 0.4 10*3/uL (ref 0.1–1.0)
Monocytes Relative: 5 %
Neutro Abs: 7.1 10*3/uL (ref 1.7–7.7)
Neutrophils Relative %: 80 %
Platelets: 264 10*3/uL (ref 150–400)
RBC: 4.87 MIL/uL (ref 3.87–5.11)
RDW: 16.3 % — ABNORMAL HIGH (ref 11.5–15.5)
WBC: 9 10*3/uL (ref 4.0–10.5)
nRBC: 0 % (ref 0.0–0.2)

## 2023-07-05 LAB — RENAL FUNCTION PANEL
Albumin: 2.9 g/dL — ABNORMAL LOW (ref 3.5–5.0)
Anion gap: 9 (ref 5–15)
BUN: 20 mg/dL (ref 8–23)
CO2: 28 mmol/L (ref 22–32)
Calcium: 8.2 mg/dL — ABNORMAL LOW (ref 8.9–10.3)
Chloride: 102 mmol/L (ref 98–111)
Creatinine, Ser: 0.74 mg/dL (ref 0.44–1.00)
GFR, Estimated: >60 mL/min (ref >60)
Glucose, Bld: 113 mg/dL — ABNORMAL HIGH (ref 70–99)
Phosphorus: 4.3 mg/dL (ref 2.5–4.6)
Potassium: 3.9 mmol/L (ref 3.5–5.1)
Sodium: 139 mmol/L (ref 135–145)

## 2023-07-05 MED ORDER — PROCHLORPERAZINE MALEATE 10 MG PO TABS: 10.0000 mg | ORAL_TABLET | Freq: Four times a day (QID) | ORAL | Status: DC | PRN

## 2023-07-05 MED ORDER — CYCLOBENZAPRINE HCL 10 MG PO TABS
10.0000 mg | ORAL_TABLET | Freq: Three times a day (TID) | ORAL | Status: DC | PRN
  Administered 2023-07-06: 10 mg via ORAL
  Filled 2023-07-05: qty 1

## 2023-07-05 MED ORDER — MORPHINE SULFATE (PF) 2 MG/ML IV SOLN
2.0000 mg | Freq: Once | INTRAVENOUS | Status: AC
  Administered 2023-07-05: 2 mg via INTRAVENOUS
  Filled 2023-07-05: qty 1

## 2023-07-05 MED ORDER — POLYETHYLENE GLYCOL 3350 17 G PO PACK
17.0000 g | PACK | Freq: Every day | ORAL | Status: DC
  Administered 2023-07-05 – 2023-07-06 (×2): 17 g via ORAL
  Filled 2023-07-05 (×2): qty 1

## 2023-07-05 MED ORDER — ENOXAPARIN SODIUM 40 MG/0.4ML IJ SOSY
40.0000 mg | PREFILLED_SYRINGE | INTRAMUSCULAR | Status: DC
  Administered 2023-07-05 – 2023-07-06 (×2): 40 mg via SUBCUTANEOUS
  Filled 2023-07-05 (×2): qty 0.4

## 2023-07-05 NOTE — Discharge Summary (Signed)
[name] KDX:[id number] DOB: January 06, 1956 DOA: 07/03/2023  PCP: Patient, No Pcp Per  Admit date: 07/03/2023 Discharge date: 07/05/2023  Time spent: 35 minutes    Discharge Diagnoses:  Principal Problem:   Pathological fracture Active Problems:   Primary breast cancer with metastasis to other site [facility])   Pathological fracture in neoplastic disease, left femur, initial encounter for fracture [facility])   Discharge Condition: stable  Diet recommendation: heart healthy  There were no vitals filed for this visit.  History of present illness:  From admission h and p REMM[place] Baker is a 67 y.o. 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  She felt a snap in her left leg while walking up the steps.  She did not fall and was able to lower herself down.  The pain has been constant since.  Worse with palpation and movement.   In the ED, vital signs are stable.  Left leg x-ray showed lucent lesion in the proximal femur extending from the subtrochanteric region into the proximal femoral shaft with pathologic fracture through the lateral cortex.  Findings are highly suspicious for metastatic disease.   The patient received IV opioid-based analgesics for her left femur pathological fracture.  EDP discussed the case with orthopedic surgery, will see in consultation.    Hospital Course:  Patient with a history of metastatic breast cancer presents with pathologic fractures of hips/pelvis secondary to known metastatic disease. Patient is on hospice but is interested in intervention particularly if it will aid with quality of life. Evaluated by orthopedics at our facility who discussed case with Dr. Ranae Palms of [facility] oncology who advises transfer there for further evaluation and management. Patient accepted to  hospitalist service at [facility].   Procedures: none   Consultations: orthopedics  Discharge Exam: Vitals:   07/05/23 0753 07/05/23 1340  BP: 131/76 137/80  Pulse: 75 86  Resp: 18 16  Temp: 98.5 F (36.9 C) 98.4 F (36.9 C)  SpO2: 100%     General exam: Appears calm and comfortable  Respiratory system: Clear to auscultation. Respiratory effort normal. Cardiovascular system: S1 & S2 heard, RRR.   Gastrointestinal system: Abdomen is nondistended, soft and nontender. No organomegaly or masses felt.  Central nervous system: Alert and oriented. No focal neurological deficits. Distal sensation intact LLE Extremities: warm, no edema Skin: No visible rashes, lesions or ulcers Psychiatry: Judgement and insight appear normal. Mood & affect appropriate.     Discharge Instructions   Discharge Instructions     Diet - low sodium heart healthy   Complete by: As directed    Increase activity slowly   Complete by: As directed       Allergies as of 07/05/2023       Reactions   Codeine Nausea And Vomiting        Medication List     TAKE these medications    acetaminophen 500 MG tablet Commonly known as: TYLENOL Take 2 tablets (1,000 mg total) by mouth 3 (three) times daily.   cyclobenzaprine 10 MG tablet Commonly known as: FLEXERIL Take 10 mg by mouth every 8 (eight) hours as needed for muscle spasms.   dexamethasone 4 MG tablet Commonly known as: DECADRON Take 4.5 mg by mouth daily.   lidocaine 5 % Commonly known as: LIDODERM Place 1 patch onto the skin daily. Remove &  Discard patch within 12 hours or as directed by MD   morphine 60 MG 12 hr tablet Commonly known as: MS CONTIN Take 60 mg by mouth every 12 (twelve) hours.   Oxycodone HCl 10 MG Tabs Take 10 mg by mouth every 6 (six) hours as needed.   prochlorperazine 10 MG tablet Commonly known as: COMPAZINE Take 1 tablet (10 mg total) by mouth every 6 (six) hours as needed for nausea or vomiting.   senna 8.6  MG Tabs tablet Commonly known as: SENOKOT Take 1 tablet (8.6 mg total) by mouth 2 (two) times daily.       Allergies  Allergen Reactions   Codeine Nausea And Vomiting      The results of significant diagnostics from this hospitalization (including imaging, microbiology, ancillary and laboratory) are listed below for reference.    Significant Diagnostic Studies: MR FEMUR LEFT W WO CONTRAST  Result Date: 07/04/2023 CLINICAL DATA:  Metastatic breast cancer. Assess left femoral fracture EXAM: MR OF THE LEFT LOWER EXTREMITY WITHOUT AND WITH CONTRAST TECHNIQUE: Multiplanar, multisequence MR imaging of the left femur was performed both before and after administration of intravenous contrast. CONTRAST:  9mL GADAVIST GADOBUTROL 1 MMOL/ML IV SOLN COMPARISON:  CT 07/04/2023 FINDINGS: Bones/Joint/Cartilage Widespread marrow replacing bone lesions throughout the included pelvis, bilateral femurs, as well as at the left lateral tibial plateau. Dominant left femoral lesion which is centered in the left intertrochanteric region in measuring approximately 7.4 x 3.6 x 3.9 cm. There is endosteal scalloping and cortical breakthrough along the posterior aspect of the intertrochanteric and subtrochanteric femur as well as within the lesser trochanter. Nondisplaced pathologic fracture of the lesser trochanter. There is extraosseous soft tissue extension of disease along the posterior margin of the intertrochanteric left femur. Additional dominant lesion centered within the intertrochanteric aspect of the contralateral right femur measures 5.1 x 3.5 cm and occupies the majority of the medullary space at this location. Large bilateral acetabular metastatic lesions. Superior left acetabular lesion demonstrates a small amount of extraosseous soft tissue extension of tumor. Hip joint line is maintained without dislocation. No displaced pathologic fractures. Ligaments Intact. Muscles and Tendons Intramuscular edema within  the vastus intermedius muscle overlying the proximal left femoral lesion, likely reactive. Tumor infiltration this muscle is not excluded. No acute tendon injury. Soft tissues 9 mm enhancing nodule within the left knee joint along the superior margin of the medial trochlea may represent a site of focal synovitis or potentially an intra-articular metastatic deposit. IMPRESSION: 1. Widespread marrow replacing bone lesions throughout the included pelvis, bilateral femurs, as well as at the left lateral tibial plateau, consistent with metastatic disease. 2. Dominant left femoral lesion centered in the intertrochanteric region with nondisplaced pathologic fracture of the lesser trochanter. There is extraosseous soft tissue extension of disease along the posterior margin of the intertrochanteric region. 3. Additional dominant lesion centered within the intertrochanteric aspect of the contralateral right femur measures 5.1 x 3.5 cm and occupies the majority of the medullary space at this location. 4. Both proximal femoral lesions are high risk for significant pathologic fracture. 5. Large bilateral acetabular metastatic lesions. Superior left acetabular lesion demonstrates a small amount of extraosseous soft tissue extension of tumor. 6. 9 mm enhancing nodule within the left knee joint along the superior margin of the medial trochlea may represent a site of focal synovitis or potentially an intra-articular metastatic deposit. Electronically Signed   By: Duanne Guess D.O.   On: 07/04/2023 13:20   CT FEMUR LEFT WO  CONTRAST  Result Date: 07/04/2023 CLINICAL DATA:  Metastatic disease evaluation surgical planning EXAM: CT OF THE LOWER LEFT EXTREMITY WITHOUT CONTRAST TECHNIQUE: Multidetector CT imaging of the lower left extremity was performed according to the standard protocol. RADIATION DOSE REDUCTION: This exam was performed according to the departmental dose-optimization program which includes automated exposure  control, adjustment of the mA and/or kV according to patient size and/or use of iterative reconstruction technique. COMPARISON:  X-ray left hip 07/03/2023, CT chest 05/04/2023 FINDINGS: Bones/Joint/Cartilage Acute nondisplaced pathologic fracture through the lesser trochanter (6:72). No dislocation or joint effusion. No evidence of severe arthropathy. Diffuse axial and appendicular skeleton lytic and blastic osseous metastases. Ligaments Suboptimally assessed by CT. Muscles and Tendons Grossly unremarkable. Soft tissues Unremarkable. Other: None IMPRESSION: 1. Diffuse axial and appendicular skeleton lytic and blastic osseous metastases. 2. Acute nondisplaced pathologic fracture through the lesser trochanter. Electronically Signed   By: Tish Frederickson M.D.   On: 07/04/2023 03:07   DG Femur Min 2 Views Left  Result Date: 07/03/2023 CLINICAL DATA:  Left femur pain. History of stage IV breast cancer with bone metastasis. EXAM: LEFT FEMUR 2 VIEWS COMPARISON:  Whole-body bone scan 09/18/2022 FINDINGS: There is a lucent lesion in the proximal femur extending from the subtrochanteric region into the proximal femoral shaft. Lesion spans approximately 10 mm. There is mild cortical scalloping with a pathologic fracture through the lateral cortex. The distal femur is intact. Minimal degenerative change about the knee and hip. IMPRESSION: Lucent lesion in the proximal femur extending from the subtrochanteric region into the proximal femoral shaft with pathologic fracture through the lateral cortex. Findings are highly suspicious for metastatic disease. Electronically Signed   By: Narda Rutherford M.D.   On: 07/03/2023 20:51    Microbiology: No results found for this or any previous visit (from the past 240 hour(s)).   Labs: Basic Metabolic Panel: Recent Labs  Lab 07/04/23 0105 07/04/23 0415 07/05/23 0816  NA 141 139 139  K 3.9 4.7 3.9  CL 103 101 102  CO2 28 29 28   GLUCOSE 96 92 113*  BUN 22 22 20    CREATININE 0.79 0.72 0.74  CALCIUM 8.6* 9.0 8.2*  MG  --  2.2  --   PHOS  --  3.3 4.3   Liver Function Tests: Recent Labs  Lab 07/04/23 0105 07/05/23 0816  AST 18  --   ALT 19  --   ALKPHOS 334*  --   BILITOT 0.4  --   PROT 6.1*  --   ALBUMIN 2.9* 2.9*   No results for input(s): "LIPASE", "AMYLASE" in the last 168 hours. No results for input(s): "AMMONIA" in the last 168 hours. CBC: Recent Labs  Lab 07/04/23 0105 07/04/23 0415 07/05/23 0816  WBC 11.3* 11.3* 9.0  NEUTROABS 8.4*  --  7.1  HGB 12.3 12.2 13.6  HCT 38.6 38.5 42.6  MCV 87.5 87.7 87.5  PLT 269 262 264   Cardiac Enzymes: No results for input(s): "CKTOTAL", "CKMB", "CKMBINDEX", "TROPONINI" in the last 168 hours. BNP: BNP (last 3 results) No results for input(s): "BNP" in the last 8760 hours.  ProBNP (last 3 results) No results for input(s): "PROBNP" in the last 8760 hours.  CBG: No results for input(s): "GLUCAP" in the last 168 hours.     Signed:  Silvano Bilis MD.  [facility] 07/05/2023, 2:51 PM

## 2023-07-05 NOTE — Progress Notes (Addendum)
PROGRESS NOTE    Kaitlin Baker  NUU:[id number] DOB: [birth year] DOA: 07/03/2023 PCP: Patient, No Pcp Per  Outpatient Specialists: oncology    Brief Narrative:   Kaitlin Baker is a 67 y.o. 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  She felt a snap in her left leg while walking up the steps.  She did not fall and was able to lower herself down.  The pain has been constant since.  Worse with palpation and movement.   In the ED, vital signs are stable.  Left leg x-ray showed lucent lesion in the proximal femur extending from the subtrochanteric region into the proximal femoral shaft with pathologic fracture through the lateral cortex.  Findings are highly suspicious for metastatic disease.     Assessment & Plan:   Principal Problem:   Pathological fracture Active Problems:   Primary breast cancer with metastasis to other site [facility])   Pathological fracture in neoplastic disease, left femur, initial encounter for fracture [facility])   Pathologic fractures, POA CT and MRI shows extensive acetabular and pelvic involvement on the left as well as right femur. Orthopedics is following, today they will be discussing the case w/ [facility] oncology (Dr. Ramon Dredge) at [facility], plan may involve transfer there. OK per ortho to eat today - pain control, bowel regimen, f/u ortho dispo   Metastasized breast cancer to the bones Breast cancer Follows with medical oncology outpatient at [place] - cont home decadron   Cancer related pain, on chronic opioid use Resume home OxyContin 20 mg twice daily As needed oxycodone and IV Dilaudid for breakthrough pain As needed and scheduled bowel regimen    DVT prophylaxis: lovenox Code Status: dnr Family Communication: daughter updated telephonically 10/21  Level of care:  Telemetry Surgical Status is: Inpatient Remains inpatient appropriate because: ortho dispo pending, may need surgery    Consultants:  orthopedics  Procedures: pending  Antimicrobials:  none   Subjective: No pain when not moving, tolerating diet, able to feel left foot and move it  Objective: Vitals:   07/04/23 1207 07/04/23 1413 07/04/23 2041 07/05/23 0753  BP: 117/88 119/70 110/73 131/76  Pulse: 75 66 (!) 102 75  Resp: 18 16 14 18   Temp: 97.9 F (36.6 C) 98 F (36.7 C) 98.1 F (36.7 C) 98.5 F (36.9 C)  TempSrc:  Oral Oral   SpO2: 100%  98% 100%    Intake/Output Summary (Last 24 hours) at 07/05/2023 1019 Last data filed at 07/05/2023 0755 Gross per 24 hour  Intake 240 ml  Output 2050 ml  Net -1810 ml   There were no vitals filed for this visit.  Examination:  General exam: Appears calm and comfortable  Respiratory system: Clear to auscultation. Respiratory effort normal. Cardiovascular system: S1 & S2 heard, RRR.   Gastrointestinal system: Abdomen is nondistended, soft and nontender. No organomegaly or masses felt.  Central nervous system: Alert and oriented. No focal neurological deficits. Distal sensation intact LLE Extremities: warm, no edema Skin: No visible rashes, lesions or ulcers Psychiatry: Judgement and insight appear normal. Mood & affect appropriate.     Data Reviewed: I have personally reviewed following labs and imaging studies  CBC: Recent Labs  Lab 07/04/23 0105 07/04/23 0415  WBC 11.3* 11.3*  NEUTROABS 8.4*  --   HGB 12.3 12.2  HCT 38.6 38.5  MCV 87.5 87.7  PLT 269 262   Basic Metabolic Panel: Recent Labs  Lab 07/04/23 0105 07/04/23 0415  NA 141 139  K 3.9 4.7  CL 103 101  CO2 28 29  GLUCOSE 96 92  BUN 22 22  CREATININE 0.79 0.72  CALCIUM 8.6* 9.0  MG  --  2.2  PHOS  --  3.3   GFR: CrCl cannot be calculated (Unknown ideal weight.). Liver Function Tests: Recent Labs  Lab 07/04/23 0105  AST 18  ALT 19   ALKPHOS 334*  BILITOT 0.4  PROT 6.1*  ALBUMIN 2.9*   No results for input(s): "LIPASE", "AMYLASE" in the last 168 hours. No results for input(s): "AMMONIA" in the last 168 hours. Coagulation Profile: No results for input(s): "INR", "PROTIME" in the last 168 hours. Cardiac Enzymes: No results for input(s): "CKTOTAL", "CKMB", "CKMBINDEX", "TROPONINI" in the last 168 hours. BNP (last 3 results) No results for input(s): "PROBNP" in the last 8760 hours. HbA1C: No results for input(s): "HGBA1C" in the last 72 hours. CBG: No results for input(s): "GLUCAP" in the last 168 hours. Lipid Profile: No results for input(s): "CHOL", "HDL", "LDLCALC", "TRIG", "CHOLHDL", "LDLDIRECT" in the last 72 hours. Thyroid Function Tests: No results for input(s): "TSH", "T4TOTAL", "FREET4", "T3FREE", "THYROIDAB" in the last 72 hours. Anemia Panel: No results for input(s): "VITAMINB12", "FOLATE", "FERRITIN", "TIBC", "IRON", "RETICCTPCT" in the last 72 hours. Urine analysis:    Component Value Date/Time   COLORURINE YELLOW 06/03/2023 0330   APPEARANCEUR HAZY (A) 06/03/2023 0330   LABSPEC 1.015 06/03/2023 0330   PHURINE 6.0 06/03/2023 0330   GLUCOSEU NEGATIVE 06/03/2023 0330   HGBUR NEGATIVE 06/03/2023 0330   BILIRUBINUR NEGATIVE 06/03/2023 0330   KETONESUR NEGATIVE 06/03/2023 0330   PROTEINUR NEGATIVE 06/03/2023 0330   UROBILINOGEN 0.2 12/03/2010 0737   NITRITE POSITIVE (A) 06/03/2023 0330   LEUKOCYTESUR LARGE (A) 06/03/2023 0330   Sepsis Labs: @LABRCNTIP (procalcitonin:4,lacticidven:4)  )No results found for this or any previous visit (from the past 240 hour(s)).       Radiology Studies: MR FEMUR LEFT W WO CONTRAST  Result Date: 07/04/2023 CLINICAL DATA:  Metastatic breast cancer. Assess left femoral fracture EXAM: MR OF THE LEFT LOWER EXTREMITY WITHOUT AND WITH CONTRAST TECHNIQUE: Multiplanar, multisequence MR imaging of the left femur was performed both before and after administration of  intravenous contrast. CONTRAST:  9mL GADAVIST GADOBUTROL 1 MMOL/ML IV SOLN COMPARISON:  CT 07/04/2023 FINDINGS: Bones/Joint/Cartilage Widespread marrow replacing bone lesions throughout the included pelvis, bilateral femurs, as well as at the left lateral tibial plateau. Dominant left femoral lesion which is centered in the left intertrochanteric region in measuring approximately 7.4 x 3.6 x 3.9 cm. There is endosteal scalloping and cortical breakthrough along the posterior aspect of the intertrochanteric and subtrochanteric femur as well as within the lesser trochanter. Nondisplaced pathologic fracture of the lesser trochanter. There is extraosseous soft tissue extension of disease along the posterior margin of the intertrochanteric left femur. Additional dominant lesion centered within the intertrochanteric aspect of the contralateral right femur measures 5.1 x 3.5 cm and occupies the majority of the medullary space at this location. Large bilateral acetabular metastatic lesions. Superior left acetabular lesion demonstrates a small amount of extraosseous soft tissue extension of tumor. Hip joint line is maintained without dislocation. No displaced pathologic fractures. Ligaments Intact. Muscles and Tendons Intramuscular edema within the vastus intermedius muscle overlying the proximal left femoral lesion, likely reactive. Tumor infiltration this muscle is not excluded. No acute tendon injury. Soft tissues 9  mm enhancing nodule within the left knee joint along the superior margin of the medial trochlea may represent a site of focal synovitis or potentially an intra-articular metastatic deposit. IMPRESSION: 1. Widespread marrow replacing bone lesions throughout the included pelvis, bilateral femurs, as well as at the left lateral tibial plateau, consistent with metastatic disease. 2. Dominant left femoral lesion centered in the intertrochanteric region with nondisplaced pathologic fracture of the lesser trochanter.  There is extraosseous soft tissue extension of disease along the posterior margin of the intertrochanteric region. 3. Additional dominant lesion centered within the intertrochanteric aspect of the contralateral right femur measures 5.1 x 3.5 cm and occupies the majority of the medullary space at this location. 4. Both proximal femoral lesions are high risk for significant pathologic fracture. 5. Large bilateral acetabular metastatic lesions. Superior left acetabular lesion demonstrates a small amount of extraosseous soft tissue extension of tumor. 6. 9 mm enhancing nodule within the left knee joint along the superior margin of the medial trochlea may represent a site of focal synovitis or potentially an intra-articular metastatic deposit. Electronically Signed   By: Duanne Guess D.O.   On: 07/04/2023 13:20   CT FEMUR LEFT WO CONTRAST  Result Date: 07/04/2023 CLINICAL DATA:  Metastatic disease evaluation surgical planning EXAM: CT OF THE LOWER LEFT EXTREMITY WITHOUT CONTRAST TECHNIQUE: Multidetector CT imaging of the lower left extremity was performed according to the standard protocol. RADIATION DOSE REDUCTION: This exam was performed according to the departmental dose-optimization program which includes automated exposure control, adjustment of the mA and/or kV according to patient size and/or use of iterative reconstruction technique. COMPARISON:  X-ray left hip 07/03/2023, CT chest 05/04/2023 FINDINGS: Bones/Joint/Cartilage Acute nondisplaced pathologic fracture through the lesser trochanter (6:72). No dislocation or joint effusion. No evidence of severe arthropathy. Diffuse axial and appendicular skeleton lytic and blastic osseous metastases. Ligaments Suboptimally assessed by CT. Muscles and Tendons Grossly unremarkable. Soft tissues Unremarkable. Other: None IMPRESSION: 1. Diffuse axial and appendicular skeleton lytic and blastic osseous metastases. 2. Acute nondisplaced pathologic fracture through  the lesser trochanter. Electronically Signed   By: Tish Frederickson M.D.   On: 07/04/2023 03:07   DG Femur Min 2 Views Left  Result Date: 07/03/2023 CLINICAL DATA:  Left femur pain. History of stage IV breast cancer with bone metastasis. EXAM: LEFT FEMUR 2 VIEWS COMPARISON:  Whole-body bone scan 09/18/2022 FINDINGS: There is a lucent lesion in the proximal femur extending from the subtrochanteric region into the proximal femoral shaft. Lesion spans approximately 10 mm. There is mild cortical scalloping with a pathologic fracture through the lateral cortex. The distal femur is intact. Minimal degenerative change about the knee and hip. IMPRESSION: Lucent lesion in the proximal femur extending from the subtrochanteric region into the proximal femoral shaft with pathologic fracture through the lateral cortex. Findings are highly suspicious for metastatic disease. Electronically Signed   By: Narda Rutherford M.D.   On: 07/03/2023 20:51        Scheduled Meds:  dexamethasone  4.5 mg Oral Daily   lidocaine  1 patch Transdermal Q24H   morphine  60 mg Oral Q12H   senna  1 tablet Oral BID   [START ON 07/08/2023] Vitamin D (Ergocalciferol)  50,000 Units Oral Weekly   Continuous Infusions:   LOS: 1 day     Silvano Bilis, MD [facility]   If 7PM-7AM, please contact night-coverage [email or web address] Password [email or web address] 07/05/2023, 10:19 AM

## 2023-07-05 NOTE — Plan of Care (Signed)

## 2023-07-05 NOTE — Consult Note (Addendum)
Orthopaedic Trauma Service (OTS) Consult   Patient ID: Kaitlin Baker MRN: [id number] DOB/AGE: May 31, 1956 67 y.o.   Reason for Consult: Pathologic fracture L proximal femur, pending pathologic fractures R proximal femur, Right and left acetabulum, metastatic lesion left lateral tibial plateau   Referring Physician: Samson Frederic, MD (ortho)   HPI: Kaitlin Baker is an 67 y.o. female with history of metastatic breast cancer diagnosed about 3 years ago who presented to the hospital yesterday with complaint of left leg pain.  Patient stated that she was walking on Saturday when she felt a pop in her left leg.  She immediately did not put any more weight on her leg.  She presented to the emergency department yesterday and workup was notable for pathologic fracture of her left proximal femur.  Subsequent imaging was obtained including MRI of the left femur and CT scan of left femur which does show involvement of both acetabulum's, right proximal femur and left lateral tibial plateau.  She also is status post prophylactic nailing of her right humerus by Dr. Aundria Rud on May 10 of this year for impending fracture of her right humerus.  Patient states that she sees Dr. Ellin Saba in [place] for oncology.  They have known about this lesion in the proximal femur for some time and had been monitoring it.  She reports that she has been having intermittent pain and soreness in her left proximal femur for about 4 months now.  She recently just ordered a walker which arrived on Saturday.  She denies any pain in her right groin or right hip.  She denies any pain in her left knee  Patient is under palliative care  Patient is on orserdu which was started 02/26/2023. Shew was previously on anastrozole and palbociclib from 07/2020 until 02/15/2023  Of note lesions have been documented as being present in her pelvis and proximal femurs as far back as April 2022. Lesions not nearly as expansive on those CTs  as they are from the CT from yesterday, with respect to the Left side   I did communicate with MRI to see if they could provide formatted images on the right hip as only the left side was ordered but there were some coronal images that did capture the right proximal femur and pelvis.  They were unable to do so due to the scanning technique and data saved on the computer.  She denies any numbness or tingling in lower extremities  Past Medical History:  Diagnosis Date   Arthritis    Breast cancer (HCC)    Family history of breast cancer    High cholesterol    PONV (postoperative nausea and vomiting)     Past Surgical History:  Procedure Laterality Date   CHOLECYSTECTOMY     HUMERUS IM NAIL Right 01/22/2023   Procedure: INTRAMEDULLARY (IM) NAIL HUMERUS;  Surgeon: Yolonda Kida, MD;  Location: [facility]S;  Service: Orthopedics;  Laterality: Right;  90   neck surgery     plate placed    Family History  Adopted: Yes  Problem Relation Age of Onset   Breast cancer Mother        dx 88s   Brain cancer Father        vs brain tumor, d. 36   Breast cancer Sister        dx 73   Breast cancer Sister        dx 10s   Liver cancer Brother  Alcohol abuse Brother    Asthma Son     Social History:  reports that she has never smoked. She has never used smokeless tobacco. She reports that she does not drink alcohol and does not use drugs.  Allergies:  Allergies  Allergen Reactions   Codeine Nausea And Vomiting    Medications: I have reviewed the patient's current medications. Current Meds  Medication Sig   acetaminophen (TYLENOL) 500 MG tablet Take 2 tablets (1,000 mg total) by mouth 3 (three) times daily.   cyclobenzaprine (FLEXERIL) 10 MG tablet Take 10 mg by mouth every 8 (eight) hours as needed for muscle spasms.   dexamethasone (DECADRON) 4 MG tablet Take 4.5 mg by mouth daily.   lidocaine (LIDODERM) 5 % Place 1 patch onto the skin daily. Remove & Discard patch within 12  hours or as directed by MD   morphine (MS CONTIN) 60 MG 12 hr tablet Take 60 mg by mouth every 12 (twelve) hours.   Oxycodone HCl 10 MG TABS Take 10 mg by mouth every 6 (six) hours as needed.   prochlorperazine (COMPAZINE) 10 MG tablet Take 1 tablet (10 mg total) by mouth every 6 (six) hours as needed for nausea or vomiting.   senna (SENOKOT) 8.6 MG TABS tablet Take 1 tablet (8.6 mg total) by mouth 2 (two) times daily.   [DISCONTINUED] capsaicin (ZOSTRIX) 0.025 % cream Apply topically 2 (two) times daily as needed (pain).     Results for orders placed or performed during the hospital encounter of 07/03/23 (from the past 48 hour(s))  CBC with Differential     Status: Abnormal   Collection Time: 07/04/23  1:05 AM  Result Value Ref Range   WBC 11.3 (H) 4.0 - 10.5 K/uL   RBC 4.41 3.87 - 5.11 MIL/uL   Hemoglobin 12.3 12.0 - 15.0 g/dL   HCT 32.4 40.1 - 02.7 %   MCV 87.5 80.0 - 100.0 fL   MCH 27.9 26.0 - 34.0 pg   MCHC 31.9 30.0 - 36.0 g/dL   RDW 25.3 (H) 66.4 - 40.3 %   Platelets 269 150 - 400 K/uL   nRBC 0.0 0.0 - 0.2 %   Neutrophils Relative % 74 %   Neutro Abs 8.4 (H) 1.7 - 7.7 K/uL   Lymphocytes Relative 19 %   Lymphs Abs 2.1 0.7 - 4.0 K/uL   Monocytes Relative 5 %   Monocytes Absolute 0.6 0.1 - 1.0 K/uL   Eosinophils Relative 0 %   Eosinophils Absolute 0.0 0.0 - 0.5 K/uL   Basophils Relative 0 %   Basophils Absolute 0.0 0.0 - 0.1 K/uL   Immature Granulocytes 2 %   Abs Immature Granulocytes 0.25 (H) 0.00 - 0.07 K/uL    Comment: Performed at [facility], [address] [place], [place] [address]  Comprehensive metabolic panel     Status: Abnormal   Collection Time: 07/04/23  1:05 AM  Result Value Ref Range   Sodium 141 135 - 145 mmol/L   Potassium 3.9 3.5 - 5.1 mmol/L   Chloride 103 98 - 111 mmol/L   CO2 28 22 - 32 mmol/L   Glucose, Bld 96 70 - 99 mg/dL    Comment: Glucose reference range applies only to samples taken after fasting for at least 8 hours.   BUN 22 8 -  23 mg/dL   Creatinine, Ser 9.56 0.44 - 1.00 mg/dL   Calcium 8.6 (L) 8.9 - 10.3 mg/dL   Total Protein 6.1 (L)  6.5 - 8.1 g/dL   Albumin 2.9 (L) 3.5 - 5.0 g/dL   AST 18 15 - 41 U/L   ALT 19 0 - 44 U/L   Alkaline Phosphatase 334 (H) 38 - 126 U/L   Total Bilirubin 0.4 0.3 - 1.2 mg/dL   GFR, Estimated >11 >91 mL/min    Comment: (NOTE) Calculated using the CKD-EPI Creatinine Equation (2021)    Anion gap 10 5 - 15    Comment: Performed at [facility], [address] [place], [place] [address]  CBC     Status: Abnormal   Collection Time: 07/04/23  4:15 AM  Result Value Ref Range   WBC 11.3 (H) 4.0 - 10.5 K/uL   RBC 4.39 3.87 - 5.11 MIL/uL   Hemoglobin 12.2 12.0 - 15.0 g/dL   HCT 56.2 13.0 - 86.5 %   MCV 87.7 80.0 - 100.0 fL   MCH 27.8 26.0 - 34.0 pg   MCHC 31.7 30.0 - 36.0 g/dL   RDW 78.4 (H) 69.6 - 29.5 %   Platelets 262 150 - 400 K/uL   nRBC 0.0 0.0 - 0.2 %    Comment: Performed at [facility], [address], [place], [place] [address]  Basic metabolic panel     Status: None   Collection Time: 07/04/23  4:15 AM  Result Value Ref Range   Sodium 139 135 - 145 mmol/L   Potassium 4.7 3.5 - 5.1 mmol/L   Chloride 101 98 - 111 mmol/L   CO2 29 22 - 32 mmol/L   Glucose, Bld 92 70 - 99 mg/dL    Comment: Glucose reference range applies only to samples taken after fasting for at least 8 hours.   BUN 22 8 - 23 mg/dL   Creatinine, Ser 2.44 0.44 - 1.00 mg/dL   Calcium 9.0 8.9 - 01.0 mg/dL   GFR, Estimated >27 >25 mL/min    Comment: (NOTE) Calculated using the CKD-EPI Creatinine Equation (2021)    Anion gap 9 5 - 15    Comment: Performed at [facility], [address] [place], [place] [address]  Magnesium     Status: None   Collection Time: 07/04/23  4:15 AM  Result Value Ref Range   Magnesium 2.2 1.7 - 2.4 mg/dL    Comment: Performed at [facility], [address] [place], [place] [address]  Phosphorus     Status: None   Collection Time: 07/04/23  4:15  AM  Result Value Ref Range   Phosphorus 3.3 2.5 - 4.6 mg/dL    Comment: Performed at [facility], [address], [place], [place] [address]    MR FEMUR LEFT W WO CONTRAST  Result Date: 07/04/2023 CLINICAL DATA:  Metastatic breast cancer. Assess left femoral fracture EXAM: MR OF THE LEFT LOWER EXTREMITY WITHOUT AND WITH CONTRAST TECHNIQUE: Multiplanar, multisequence MR imaging of the left femur was performed both before and after administration of intravenous contrast. CONTRAST:  9mL GADAVIST GADOBUTROL 1 MMOL/ML IV SOLN COMPARISON:  CT 07/04/2023 FINDINGS: Bones/Joint/Cartilage Widespread marrow replacing bone lesions throughout the included pelvis, bilateral femurs, as well as at the left lateral tibial plateau. Dominant left femoral lesion which is centered in the left intertrochanteric region in measuring approximately 7.4 x 3.6 x 3.9 cm. There is endosteal scalloping and cortical breakthrough along the posterior aspect of the intertrochanteric and subtrochanteric femur as well as within the lesser trochanter. Nondisplaced pathologic fracture of the lesser trochanter. There  is extraosseous soft tissue extension of disease along the posterior margin of the intertrochanteric left femur. Additional dominant lesion centered within the intertrochanteric aspect of the contralateral right femur measures 5.1 x 3.5 cm and occupies the majority of the medullary space at this location. Large bilateral acetabular metastatic lesions. Superior left acetabular lesion demonstrates a small amount of extraosseous soft tissue extension of tumor. Hip joint line is maintained without dislocation. No displaced pathologic fractures. Ligaments Intact. Muscles and Tendons Intramuscular edema within the vastus intermedius muscle overlying the proximal left femoral lesion, likely reactive. Tumor infiltration this muscle is not excluded. No acute tendon injury. Soft tissues 9 mm enhancing nodule within the left knee joint  along the superior margin of the medial trochlea may represent a site of focal synovitis or potentially an intra-articular metastatic deposit. IMPRESSION: 1. Widespread marrow replacing bone lesions throughout the included pelvis, bilateral femurs, as well as at the left lateral tibial plateau, consistent with metastatic disease. 2. Dominant left femoral lesion centered in the intertrochanteric region with nondisplaced pathologic fracture of the lesser trochanter. There is extraosseous soft tissue extension of disease along the posterior margin of the intertrochanteric region. 3. Additional dominant lesion centered within the intertrochanteric aspect of the contralateral right femur measures 5.1 x 3.5 cm and occupies the majority of the medullary space at this location. 4. Both proximal femoral lesions are high risk for significant pathologic fracture. 5. Large bilateral acetabular metastatic lesions. Superior left acetabular lesion demonstrates a small amount of extraosseous soft tissue extension of tumor. 6. 9 mm enhancing nodule within the left knee joint along the superior margin of the medial trochlea may represent a site of focal synovitis or potentially an intra-articular metastatic deposit. Electronically Signed   By: Duanne Guess D.O.   On: 07/04/2023 13:20   CT FEMUR LEFT WO CONTRAST  Result Date: 07/04/2023 CLINICAL DATA:  Metastatic disease evaluation surgical planning EXAM: CT OF THE LOWER LEFT EXTREMITY WITHOUT CONTRAST TECHNIQUE: Multidetector CT imaging of the lower left extremity was performed according to the standard protocol. RADIATION DOSE REDUCTION: This exam was performed according to the departmental dose-optimization program which includes automated exposure control, adjustment of the mA and/or kV according to patient size and/or use of iterative reconstruction technique. COMPARISON:  X-ray left hip 07/03/2023, CT chest 05/04/2023 FINDINGS: Bones/Joint/Cartilage Acute nondisplaced  pathologic fracture through the lesser trochanter (6:72). No dislocation or joint effusion. No evidence of severe arthropathy. Diffuse axial and appendicular skeleton lytic and blastic osseous metastases. Ligaments Suboptimally assessed by CT. Muscles and Tendons Grossly unremarkable. Soft tissues Unremarkable. Other: None IMPRESSION: 1. Diffuse axial and appendicular skeleton lytic and blastic osseous metastases. 2. Acute nondisplaced pathologic fracture through the lesser trochanter. Electronically Signed   By: Tish Frederickson M.D.   On: 07/04/2023 03:07   DG Femur Min 2 Views Left  Result Date: 07/03/2023 CLINICAL DATA:  Left femur pain. History of stage IV breast cancer with bone metastasis. EXAM: LEFT FEMUR 2 VIEWS COMPARISON:  Whole-body bone scan 09/18/2022 FINDINGS: There is a lucent lesion in the proximal femur extending from the subtrochanteric region into the proximal femoral shaft. Lesion spans approximately 10 mm. There is mild cortical scalloping with a pathologic fracture through the lateral cortex. The distal femur is intact. Minimal degenerative change about the knee and hip. IMPRESSION: Lucent lesion in the proximal femur extending from the subtrochanteric region into the proximal femoral shaft with pathologic fracture through the lateral cortex. Findings are highly suspicious for metastatic disease. Electronically Signed  By: Narda Rutherford M.D.   On: 07/03/2023 20:51    Intake/Output      10/20 0701 10/21 0700 10/21 0701 10/22 0700   P.O. 240    Total Intake 240    Urine 750 1300   Total Output 750 1300   Net -510 -1300        Urine Occurrence  1 x      Review of Systems  Constitutional:  Negative for chills and fever.  Cardiovascular:  Negative for chest pain and palpitations.  Musculoskeletal:        Left hip pain, thigh pain  Neurological:  Negative for tingling and sensory change.   Blood pressure 131/76, pulse 75, temperature 98.5 F (36.9 C), resp. rate 18,  SpO2 100%. Physical Exam Vitals reviewed.  Constitutional:      Comments: Pleasant 67 y/o female  Cardiovascular:     Heart sounds: S1 normal and S2 normal.  Pulmonary:     Effort: Pulmonary effort is normal.  Musculoskeletal:     Comments:  Left Lower Extremity  Rotation and length appear appropriate Mild swelling B LEx + pain with manipulation of L hip including gentle log roll Left knee is nontender + DP pulse No traumatic wounds DPN, SPN, TN sensation intact EHL, FHL, lesser toe motor intact Ankle flexion, extension, inversion eversion intact + Quad set No DCT  Right Lower extremity  No traumatic wounds Mild swelling distally Extremity is warm + DP pulse No DCT Compartments are soft Motor and sensory functions grossly intact No pain with axial loading or logrolling of her hip   Skin:    General: Skin is warm.     Capillary Refill: Capillary refill takes less than 2 seconds.  Neurological:     Mental Status: She is alert and oriented to person, place, and time.  Psychiatric:        Attention and Perception: Attention normal.        Mood and Affect: Mood normal.        Speech: Speech normal.        Behavior: Behavior is cooperative.        Assessment/Plan:  67 year old female with pathologic left proximal femur fracture due to metastatic breast cancer.  Extensive metastatic disease to pelvis including bilateral acetabulums, metastatic disease left proximal femur, left lateral tibial plateau. L knee intra-articular nondule (intra-articular lesion vs focal synovitis)  -67 y/o female with extensive metastatic bone disease due to breast cancer including pathologic fracture of L proximal femur, pending R proximal femur fracture and extensive B acetabulum/pelvis involvement. L lateral tibial plateau lesion, possible intra-articular metastatic deposit L knee    Very complex problem   In discussions with [facility]ology team at [facility]   No surgery today    Dispo  TBD   Will need L femur addressed as it is an acute fracture   Continue with current inpatient care    NWB L leg    Ok to mobilize out of bed as tolerated    - Pain management:  Multimodal   - Dispo:  Pending discussions with [facility] onc team at [facility]   No OR today   Ok to eat     Mearl Latin, PA-C [phone] (C) 07/05/2023, 9:59 AM  [facility] [address] [place] [address] [phone] [phone] (F)    After 5pm and on the weekends please log on to Amion, go to orthopaedics and the look under the [facility]  Call for the provider(s) on call. You can also call our office at [phone] and then follow the prompts to be connected to the call team.

## 2023-07-06 DIAGNOSIS — M84552A Pathological fracture in neoplastic disease, left femur, initial encounter for fracture: Secondary | ICD-10-CM | POA: Diagnosis not present

## 2023-07-06 NOTE — Discharge Summary (Signed)
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  She felt a snap in her left leg while walking up the steps.  She did not fall and was able to lower herself down.  The pain has been constant since.  Worse with palpation and movement.  Patient s/p prophylactic nailing right humerus by Dr. Aundria Rud Jan 22, 2023 for impending fracture of the right humerus.   In the ED, vital signs are stable.  Left leg x-ray showed lucent lesion in the proximal femur extending from the subtrochanteric region into the proximal femoral shaft with pathologic fracture through the lateral cortex.  Findings are highly suspicious for metastatic disease. The patient received IV opioid-based analgesics for her left femur pathological fracture.  Orthopedics was consulted.  TRH consulted for admission for further evaluation management of pathologic femur fracture.  Hospital course:  Left proximal femur pathologic fracture Patient presenting to the ED with left leg pain.  MRI/CT findings notable for pathologic fracture left proximal femur with involvement of both acetabulum, right proximal femur, left lateral tibial plateau.  Orthopedics was consulted and followed during hospital course.  Given the complexity of her situation, orthopedics discussed with [facility] oncology team at [facility].  Discussed with orthopedic oncologist, Dr. Vilinda Blanks; and patient has been accepted for transfer by [facility] hospitalist service, Dr. Earney Hamburg.  Continue nonweightbearing left lower extremity.  Transferring to [facility] for further evaluation and management.  Discharge Diagnoses:  Principal Problem:   Pathological fracture Active Problems:   Primary breast cancer with metastasis to other site [facility])   Pathological fracture in neoplastic disease, left femur, initial encounter for fracture [facility])    Discharge Instructions  Discharge Instructions     Diet - low sodium heart healthy   Complete by: As directed    Increase activity slowly   Complete by: As directed       Allergies as of 07/06/2023       Reactions   Codeine Nausea And Vomiting        Medication List     TAKE these medications    acetaminophen 500 MG tablet Commonly known as: TYLENOL Take 2 tablets (1,000 mg total) by mouth 3 (three) times daily.   cyclobenzaprine 10 MG tablet Commonly known as: FLEXERIL Take 10 mg by mouth every 8 (eight) hours as needed for muscle spasms.   dexamethasone 4 MG tablet Commonly known as: DECADRON Take 4.5 mg by mouth daily.   lidocaine 5 % Commonly known as: LIDODERM Place 1 patch onto the skin daily. Remove & Discard patch within 12 hours or as directed by MD   morphine 60 MG 12 hr tablet Commonly known as: MS CONTIN Take 60 mg by mouth every 12 (twelve) hours.   Oxycodone HCl 10 MG Tabs Take 10 mg by mouth every 6 (six) hours as needed.   prochlorperazine 10 MG tablet Commonly known as: COMPAZINE Take 1 tablet (10 mg total) by mouth every  6 (six) hours as needed for nausea or vomiting.   senna 8.6 MG Tabs tablet Commonly known as: SENOKOT Take 1 tablet (8.6 mg total) by mouth 2 (two) times daily.        Allergies  Allergen Reactions   Codeine Nausea And Vomiting    Consultations: Orthopedics   Procedures/Studies: MR FEMUR LEFT W WO CONTRAST  Result Date:  07/04/2023 CLINICAL DATA:  Metastatic breast cancer. Assess left femoral fracture EXAM: MR OF THE LEFT LOWER EXTREMITY WITHOUT AND WITH CONTRAST TECHNIQUE: Multiplanar, multisequence MR imaging of the left femur was performed both before and after administration of intravenous contrast. CONTRAST:  9mL GADAVIST GADOBUTROL 1 MMOL/ML IV SOLN COMPARISON:  CT 07/04/2023 FINDINGS: Bones/Joint/Cartilage Widespread marrow replacing bone lesions throughout the included pelvis, bilateral femurs, as well as at the left lateral tibial plateau. Dominant left femoral lesion which is centered in the left intertrochanteric region in measuring approximately 7.4 x 3.6 x 3.9 cm. There is endosteal scalloping and cortical breakthrough along the posterior aspect of the intertrochanteric and subtrochanteric femur as well as within the lesser trochanter. Nondisplaced pathologic fracture of the lesser trochanter. There is extraosseous soft tissue extension of disease along the posterior margin of the intertrochanteric left femur. Additional dominant lesion centered within the intertrochanteric aspect of the contralateral right femur measures 5.1 x 3.5 cm and occupies the majority of the medullary space at this location. Large bilateral acetabular metastatic lesions. Superior left acetabular lesion demonstrates a small amount of extraosseous soft tissue extension of tumor. Hip joint line is maintained without dislocation. No displaced pathologic fractures. Ligaments Intact. Muscles and Tendons Intramuscular edema within the vastus intermedius muscle overlying the proximal left femoral lesion, likely reactive. Tumor infiltration this muscle is not excluded. No acute tendon injury. Soft tissues 9 mm enhancing nodule within the left knee joint along the superior margin of the medial trochlea may represent a site of focal synovitis or potentially an intra-articular metastatic deposit. IMPRESSION: 1. Widespread marrow replacing bone lesions  throughout the included pelvis, bilateral femurs, as well as at the left lateral tibial plateau, consistent with metastatic disease. 2. Dominant left femoral lesion centered in the intertrochanteric region with nondisplaced pathologic fracture of the lesser trochanter. There is extraosseous soft tissue extension of disease along the posterior margin of the intertrochanteric region. 3. Additional dominant lesion centered within the intertrochanteric aspect of the contralateral right femur measures 5.1 x 3.5 cm and occupies the majority of the medullary space at this location. 4. Both proximal femoral lesions are high risk for significant pathologic fracture. 5. Large bilateral acetabular metastatic lesions. Superior left acetabular lesion demonstrates a small amount of extraosseous soft tissue extension of tumor. 6. 9 mm enhancing nodule within the left knee joint along the superior margin of the medial trochlea may represent a site of focal synovitis or potentially an intra-articular metastatic deposit. Electronically Signed   By: Duanne Guess D.O.   On: 07/04/2023 13:20   CT FEMUR LEFT WO CONTRAST  Result Date: 07/04/2023 CLINICAL DATA:  Metastatic disease evaluation surgical planning EXAM: CT OF THE LOWER LEFT EXTREMITY WITHOUT CONTRAST TECHNIQUE: Multidetector CT imaging of the lower left extremity was performed according to the standard protocol. RADIATION DOSE REDUCTION: This exam was performed according to the departmental dose-optimization program which includes automated exposure control, adjustment of the mA and/or kV according to patient size and/or use of iterative reconstruction technique. COMPARISON:  X-ray left hip 07/03/2023, CT chest 05/04/2023 FINDINGS: Bones/Joint/Cartilage Acute nondisplaced pathologic fracture through the lesser  trochanter (6:72). No dislocation or joint effusion. No evidence of severe arthropathy. Diffuse axial and appendicular skeleton lytic and blastic osseous  metastases. Ligaments Suboptimally assessed by CT. Muscles and Tendons Grossly unremarkable. Soft tissues Unremarkable. Other: None IMPRESSION: 1. Diffuse axial and appendicular skeleton lytic and blastic osseous metastases. 2. Acute nondisplaced pathologic fracture through the lesser trochanter. Electronically Signed   By: Tish Frederickson M.D.   On: 07/04/2023 03:07   DG Femur Min 2 Views Left  Result Date: 07/03/2023 CLINICAL DATA:  Left femur pain. History of stage IV breast cancer with bone metastasis. EXAM: LEFT FEMUR 2 VIEWS COMPARISON:  Whole-body bone scan 09/18/2022 FINDINGS: There is a lucent lesion in the proximal femur extending from the subtrochanteric region into the proximal femoral shaft. Lesion spans approximately 10 mm. There is mild cortical scalloping with a pathologic fracture through the lateral cortex. The distal femur is intact. Minimal degenerative change about the knee and hip. IMPRESSION: Lucent lesion in the proximal femur extending from the subtrochanteric region into the proximal femoral shaft with pathologic fracture through the lateral cortex. Findings are highly suspicious for metastatic disease. Electronically Signed   By: Narda Rutherford M.D.   On: 07/03/2023 20:51     Subjective: Patient seen examined bedside, resting comfortably.  Pain controlled.  Discharging to [facility] for further care regarding pathologic femur fracture.  No other specific questions or concerns at this time.  Denies headache, no visual changes, no chest pain, no palpitations, no shortness of breath, no abdominal pain, no fever/chills/night sweats, no nausea/vomiting/diarrhea, no focal weakness, no fatigue, no paresthesias.  No acute events overnight per nursing.  Discharge Exam: Vitals:   07/06/23 0500 07/06/23 0759  BP: 115/79 (!) 127/94  Pulse: 83 98  Resp: 15 16  Temp: 98.2 F (36.8 C) 98.5 F (36.9 C)  SpO2:  99%   Vitals:   07/05/23 2000 07/06/23 0500  07/06/23 0759 07/06/23 1300  BP: 121/73 115/79 (!) 127/94   Pulse: 95 83 98   Resp: 13 15 16    Temp: 98.4 F (36.9 C) 98.2 F (36.8 C) 98.5 F (36.9 C)   TempSrc: Oral Oral Oral   SpO2: 96%  99%   Weight:    77.5 kg    Physical Exam: GEN: NAD, alert and oriented x 3, wd/wn HEENT: NCAT, PERRL, EOMI, sclera clear, MMM PULM: CTAB w/o wheezes/crackles, normal respiratory effort, on room air CV: RRR w/o M/G/R GI: abd soft, NTND, NABS, no R/G/M MSK: no peripheral edema, neurovascular intact NEURO: No focal neurological deficits PSYCH: normal mood/affect Integumentary: No concerning rashes/lesions/wounds nonexposed skin surfaces.    The results of significant diagnostics from this hospitalization (including imaging, microbiology, ancillary and laboratory) are listed below for reference.     Microbiology: No results found for this or any previous visit (from the past 240 hour(s)).   Labs: BNP (last 3 results) No results for input(s): "BNP" in the last 8760 hours. Basic Metabolic Panel: Recent Labs  Lab 07/04/23 0105 07/04/23 0415 07/05/23 0816  NA 141 139 139  K 3.9 4.7 3.9  CL 103 101 102  CO2 28 29 28   GLUCOSE 96 92 113*  BUN 22 22 20   CREATININE 0.79 0.72 0.74  CALCIUM 8.6* 9.0 8.2*  MG  --  2.2  --   PHOS  --  3.3 4.3   Liver Function Tests: Recent Labs  Lab 07/04/23 0105 07/05/23 0816  AST 18  --   ALT 19  --  ALKPHOS 334*  --   BILITOT 0.4  --   PROT 6.1*  --   ALBUMIN 2.9* 2.9*   No results for input(s): "LIPASE", "AMYLASE" in the last 168 hours. No results for input(s): "AMMONIA" in the last 168 hours. CBC: Recent Labs  Lab 07/04/23 0105 07/04/23 0415 07/05/23 0816  WBC 11.3* 11.3* 9.0  NEUTROABS 8.4*  --  7.1  HGB 12.3 12.2 13.6  HCT 38.6 38.5 42.6  MCV 87.5 87.7 87.5  PLT 269 262 264   Cardiac Enzymes: No results for input(s): "CKTOTAL", "CKMB", "CKMBINDEX", "TROPONINI" in the last 168 hours. BNP: Invalid input(s):  "POCBNP" CBG: No results for input(s): "GLUCAP" in the last 168 hours. D-Dimer No results for input(s): "DDIMER" in the last 72 hours. Hgb A1c No results for input(s): "HGBA1C" in the last 72 hours. Lipid Profile No results for input(s): "CHOL", "HDL", "LDLCALC", "TRIG", "CHOLHDL", "LDLDIRECT" in the last 72 hours. Thyroid function studies No results for input(s): "TSH", "T4TOTAL", "T3FREE", "THYROIDAB" in the last 72 hours.  Invalid input(s): "FREET3" Anemia work up No results for input(s): "VITAMINB12", "FOLATE", "FERRITIN", "TIBC", "IRON", "RETICCTPCT" in the last 72 hours. Urinalysis    Component Value Date/Time   COLORURINE YELLOW 06/03/2023 0330   APPEARANCEUR HAZY (A) 06/03/2023 0330   LABSPEC 1.015 06/03/2023 0330   PHURINE 6.0 06/03/2023 0330   GLUCOSEU NEGATIVE 06/03/2023 0330   HGBUR NEGATIVE 06/03/2023 0330   BILIRUBINUR NEGATIVE 06/03/2023 0330   KETONESUR NEGATIVE 06/03/2023 0330   PROTEINUR NEGATIVE 06/03/2023 0330   UROBILINOGEN 0.2 12/03/2010 0737   NITRITE POSITIVE (A) 06/03/2023 0330   LEUKOCYTESUR LARGE (A) 06/03/2023 0330   Sepsis Labs Recent Labs  Lab 07/04/23 0105 07/04/23 0415 07/05/23 0816  WBC 11.3* 11.3* 9.0   Microbiology No results found for this or any previous visit (from the past 240 hour(s)).   Time coordinating discharge: Over 30 minutes  SIGNED:   Alvira Philips [place], DO  [facility] 07/06/2023, 2:23 PM

## 2023-07-06 NOTE — TOC Transition Note (Addendum)
Transition of Care [facility]) - CM/SW Discharge Note   Patient Details  Name: Kaitlin Baker MRN: [id number] Date of Birth: June 24, 1956  Transition of Care [facility]) CM/SW Contact:  Epifanio Lesches, RN Phone Number: 07/06/2023, 12:56 PM   Clinical Narrative:    Patient will DC to: [facility] Anticipated DC date: 07/06/2023 Family notified: yes, husband Building services engineer by: [facility]      - Pathological fracture in neoplastic disease, left femur  Per MD patient ready for acute to acute transfer to [facility]. RN, patient, patient's family, and facility aware of plan. PTA pt active with [name] for home hospice care. Hospice care revoked with this admission, [place]/  [name] RN Coordinator following, [phone]. Receiving MD at [facility]. RN to call report prior to [facility] receiving RN, [phone]. Rm #  [id number].  [facility] flight to provide transportation to [facility].  [facility] will sign off for now as intervention is no longer needed. Please consult [place] again if new needs arise.    Final next level of care: Acute to Acute Transfer Barriers to Discharge: No Barriers Identified   Patient Goals and CMS Choice      Discharge Placement                         Discharge Plan and Services Additional resources added to the After Visit Summary for                                       Social Determinants of Health (SDOH) Interventions SDOH Screenings   Food Insecurity: No Food Insecurity (07/04/2023)  Housing: Low Risk  (07/04/2023)  Transportation Needs: No Transportation Needs (07/04/2023)  Utilities: Not At Risk (07/04/2023)  Alcohol Screen: Low Risk  (07/17/2020)  Depression (PHQ2-9): Low Risk  (07/17/2020)  Financial Resource Strain: Low Risk  (07/17/2020)  Physical Activity: Inactive (07/17/2020)  Social Connections: Unknown (05/20/2023)   Received from [facility]  Stress: No Stress Concern Present  (07/17/2020)  Tobacco Use: Low Risk  (07/03/2023)     Readmission Risk Interventions    05/05/2023    3:00 PM  Readmission Risk Prevention Plan  Post Dischage Appt Complete  Medication Screening Complete  Transportation Screening Complete

## 2023-07-06 NOTE — Progress Notes (Signed)
Patient transferred to [facility] by [facility] transport team, paperwork given to staff. Patient has personal belongings with her. Family are of transfer plan.  Wilena Tyndall, Kae Heller, RN'

## 2023-07-06 NOTE — Plan of Care (Signed)
  Problem: Education: Goal: Knowledge of General Education information will improve Description: Including pain rating scale, medication(s)/side effects and non-pharmacologic comfort measures Outcome: Progressing   Problem: Health Behavior/Discharge Planning: Goal: Ability to manage health-related needs will improve Outcome: Progressing   Problem: Clinical Measurements: Goal: Ability to maintain clinical measurements within normal limits will improve Outcome: Progressing   Problem: Clinical Measurements: Goal: Will remain free from infection Outcome: Progressing   Problem: Clinical Measurements: Goal: Respiratory complications will improve Outcome: Progressing   Problem: Clinical Measurements: Goal: Cardiovascular complication will be avoided Outcome: Progressing   Problem: Nutrition: Goal: Adequate nutrition will be maintained Outcome: Progressing   Problem: Activity: Goal: Risk for activity intolerance will decrease Outcome: Progressing   Problem: Coping: Goal: Level of anxiety will decrease Outcome: Progressing

## 2023-07-06 NOTE — Progress Notes (Signed)
OT Cancellation Note  Patient Details Name: Kaitlin Baker MRN: [id number] DOB: [birth year]   Cancelled Treatment:    Reason Eval/Treat Not Completed: Other (comment)- per notes and speaking to RN, plans to transfer to [facility] in order to address femur fx. Will follow acutely to make sure pt is transferred, if pt stays at [facility] will likely need PT order as well.   Barry Brunner, OT Acute Rehabilitation Services Office [phone]   Chancy Milroy 07/06/2023, 8:32 AM

## 2023-07-06 NOTE — Plan of Care (Signed)
  Problem: Education: Goal: Knowledge of General Education information will improve Description: Including pain rating scale, medication(s)/side effects and non-pharmacologic comfort measures 07/06/2023 1436 by Genevie Ann, RN Outcome: Completed/Met 07/06/2023 1436 by Genevie Ann, RN Outcome: Adequate for Discharge   Problem: Health Behavior/Discharge Planning: Goal: Ability to manage health-related needs will improve 07/06/2023 1436 by Genevie Ann, RN Outcome: Completed/Met 07/06/2023 1436 by Genevie Ann, RN Outcome: Adequate for Discharge   Problem: Clinical Measurements: Goal: Ability to maintain clinical measurements within normal limits will improve 07/06/2023 1436 by Genevie Ann, RN Outcome: Completed/Met 07/06/2023 1436 by Genevie Ann, RN Outcome: Adequate for Discharge Goal: Will remain free from infection 07/06/2023 1436 by Genevie Ann, RN Outcome: Completed/Met 07/06/2023 1436 by Genevie Ann, RN Outcome: Adequate for Discharge Goal: Diagnostic test results will improve 07/06/2023 1436 by Genevie Ann, RN Outcome: Completed/Met 07/06/2023 1436 by Genevie Ann, RN Outcome: Adequate for Discharge Goal: Respiratory complications will improve 07/06/2023 1436 by Genevie Ann, RN Outcome: Completed/Met 07/06/2023 1436 by Genevie Ann, RN Outcome: Adequate for Discharge Goal: Cardiovascular complication will be avoided 07/06/2023 1436 by Genevie Ann, RN Outcome: Completed/Met 07/06/2023 1436 by Genevie Ann, RN Outcome: Adequate for Discharge   Problem: Activity: Goal: Risk for activity intolerance will decrease 07/06/2023 1436 by Genevie Ann, RN Outcome: Completed/Met 07/06/2023 1436 by Genevie Ann, RN Outcome: Adequate for Discharge   Problem: Nutrition: Goal: Adequate nutrition will be maintained 07/06/2023 1436 by Genevie Ann, RN Outcome: Completed/Met 07/06/2023  1436 by Genevie Ann, RN Outcome: Adequate for Discharge   Problem: Coping: Goal: Level of anxiety will decrease 07/06/2023 1436 by Genevie Ann, RN Outcome: Completed/Met 07/06/2023 1436 by Genevie Ann, RN Outcome: Adequate for Discharge   Problem: Elimination: Goal: Will not experience complications related to bowel motility 07/06/2023 1436 by Genevie Ann, RN Outcome: Completed/Met 07/06/2023 1436 by Genevie Ann, RN Outcome: Adequate for Discharge Goal: Will not experience complications related to urinary retention 07/06/2023 1436 by Genevie Ann, RN Outcome: Completed/Met 07/06/2023 1436 by Genevie Ann, RN Outcome: Adequate for Discharge   Problem: Pain Managment: Goal: General experience of comfort will improve 07/06/2023 1436 by Genevie Ann, RN Outcome: Completed/Met 07/06/2023 1436 by Genevie Ann, RN Outcome: Adequate for Discharge   Problem: Safety: Goal: Ability to remain free from injury will improve 07/06/2023 1436 by Genevie Ann, RN Outcome: Completed/Met 07/06/2023 1436 by Genevie Ann, RN Outcome: Adequate for Discharge   Problem: Skin Integrity: Goal: Risk for impaired skin integrity will decrease 07/06/2023 1436 by Genevie Ann, RN Outcome: Completed/Met 07/06/2023 1436 by Genevie Ann, RN Outcome: Adequate for Discharge    Patient transferred to [facility].

## 2023-07-16 ENCOUNTER — Encounter (HOSPITAL_COMMUNITY): Admission: RE | Payer: Self-pay | Source: Home / Self Care

## 2023-07-16 ENCOUNTER — Inpatient Hospital Stay (HOSPITAL_COMMUNITY): Admission: RE | Admit: 2023-07-16 | Payer: Medicare Other | Source: Home / Self Care | Admitting: Orthopedic Surgery

## 2023-07-16 SURGERY — INSERTION, INTRAMEDULLARY ROD, FEMUR
Anesthesia: General | Laterality: Right

## 2023-07-22 ENCOUNTER — Other Ambulatory Visit (HOSPITAL_COMMUNITY): Payer: Medicare Other

## 2023-07-22 ENCOUNTER — Inpatient Hospital Stay: Payer: Medicare Other

## 2023-07-23 NOTE — Progress Notes (Signed)
[facility] called to notify that patient will be receiving home health services from them 1x/week x one month.

## 2023-07-26 ENCOUNTER — Inpatient Hospital Stay: Payer: Medicare Other | Admitting: Hematology

## 2023-07-29 ENCOUNTER — Ambulatory Visit: Payer: Medicare Other | Admitting: Hematology

## 2023-08-16 ENCOUNTER — Emergency Department (HOSPITAL_COMMUNITY): Payer: Medicare Other

## 2023-08-16 ENCOUNTER — Other Ambulatory Visit: Payer: Self-pay

## 2023-08-16 ENCOUNTER — Inpatient Hospital Stay (HOSPITAL_COMMUNITY)
Admission: EM | Admit: 2023-08-16 | Discharge: 2023-08-19 | DRG: 603 | Disposition: A | Discharge status: Home Health | Payer: Medicare Other | Attending: Internal Medicine | Admitting: Internal Medicine

## 2023-08-16 ENCOUNTER — Encounter (HOSPITAL_COMMUNITY): Payer: Self-pay | Admitting: Emergency Medicine

## 2023-08-16 DIAGNOSIS — M79606 Pain in leg, unspecified: Secondary | ICD-10-CM | POA: Diagnosis present

## 2023-08-16 DIAGNOSIS — Z7952 Long term (current) use of systemic steroids: Secondary | ICD-10-CM | POA: Diagnosis not present

## 2023-08-16 DIAGNOSIS — R609 Edema, unspecified: Principal | ICD-10-CM

## 2023-08-16 DIAGNOSIS — C7951 Secondary malignant neoplasm of bone: Secondary | ICD-10-CM | POA: Diagnosis present

## 2023-08-16 DIAGNOSIS — Z86718 Personal history of other venous thrombosis and embolism: Secondary | ICD-10-CM | POA: Diagnosis not present

## 2023-08-16 DIAGNOSIS — R000 Tachycardia, unspecified: Secondary | ICD-10-CM | POA: Diagnosis present

## 2023-08-16 DIAGNOSIS — E876 Hypokalemia: Secondary | ICD-10-CM | POA: Diagnosis present

## 2023-08-16 DIAGNOSIS — Z635 Disruption of family by separation and divorce: Secondary | ICD-10-CM

## 2023-08-16 DIAGNOSIS — E66811 Obesity, class 1: Secondary | ICD-10-CM | POA: Diagnosis present

## 2023-08-16 DIAGNOSIS — L03116 Cellulitis of left lower limb: Secondary | ICD-10-CM | POA: Diagnosis present

## 2023-08-16 DIAGNOSIS — E8809 Other disorders of plasma-protein metabolism, not elsewhere classified: Secondary | ICD-10-CM | POA: Diagnosis present

## 2023-08-16 DIAGNOSIS — Z9049 Acquired absence of other specified parts of digestive tract: Secondary | ICD-10-CM | POA: Diagnosis not present

## 2023-08-16 DIAGNOSIS — D630 Anemia in neoplastic disease: Secondary | ICD-10-CM | POA: Diagnosis present

## 2023-08-16 DIAGNOSIS — R748 Abnormal levels of other serum enzymes: Secondary | ICD-10-CM | POA: Diagnosis present

## 2023-08-16 DIAGNOSIS — R52 Pain, unspecified: Secondary | ICD-10-CM | POA: Diagnosis not present

## 2023-08-16 DIAGNOSIS — L03115 Cellulitis of right lower limb: Secondary | ICD-10-CM | POA: Diagnosis present

## 2023-08-16 DIAGNOSIS — E669 Obesity, unspecified: Secondary | ICD-10-CM

## 2023-08-16 DIAGNOSIS — R0609 Other forms of dyspnea: Secondary | ICD-10-CM | POA: Diagnosis not present

## 2023-08-16 DIAGNOSIS — Z803 Family history of malignant neoplasm of breast: Secondary | ICD-10-CM | POA: Diagnosis not present

## 2023-08-16 DIAGNOSIS — Z66 Do not resuscitate: Secondary | ICD-10-CM | POA: Diagnosis present

## 2023-08-16 DIAGNOSIS — Z7189 Other specified counseling: Secondary | ICD-10-CM | POA: Diagnosis not present

## 2023-08-16 DIAGNOSIS — X58XXXA Exposure to other specified factors, initial encounter: Secondary | ICD-10-CM | POA: Diagnosis present

## 2023-08-16 DIAGNOSIS — Z79891 Long term (current) use of opiate analgesic: Secondary | ICD-10-CM

## 2023-08-16 DIAGNOSIS — M1990 Unspecified osteoarthritis, unspecified site: Secondary | ICD-10-CM | POA: Diagnosis present

## 2023-08-16 DIAGNOSIS — Z79899 Other long term (current) drug therapy: Secondary | ICD-10-CM

## 2023-08-16 DIAGNOSIS — C50919 Malignant neoplasm of unspecified site of unspecified female breast: Secondary | ICD-10-CM | POA: Diagnosis not present

## 2023-08-16 DIAGNOSIS — S90822A Blister (nonthermal), left foot, initial encounter: Secondary | ICD-10-CM | POA: Diagnosis present

## 2023-08-16 DIAGNOSIS — Z6833 Body mass index (BMI) 33.0-33.9, adult: Secondary | ICD-10-CM | POA: Diagnosis not present

## 2023-08-16 DIAGNOSIS — E7800 Pure hypercholesterolemia, unspecified: Secondary | ICD-10-CM | POA: Diagnosis present

## 2023-08-16 DIAGNOSIS — Z515 Encounter for palliative care: Secondary | ICD-10-CM | POA: Diagnosis not present

## 2023-08-16 DIAGNOSIS — G893 Neoplasm related pain (acute) (chronic): Secondary | ICD-10-CM | POA: Diagnosis present

## 2023-08-16 DIAGNOSIS — Z885 Allergy status to narcotic agent status: Secondary | ICD-10-CM | POA: Diagnosis not present

## 2023-08-16 DIAGNOSIS — L03119 Cellulitis of unspecified part of limb: Secondary | ICD-10-CM

## 2023-08-16 DIAGNOSIS — L0390 Cellulitis, unspecified: Secondary | ICD-10-CM | POA: Diagnosis present

## 2023-08-16 DIAGNOSIS — C50912 Malignant neoplasm of unspecified site of left female breast: Secondary | ICD-10-CM | POA: Diagnosis present

## 2023-08-16 DIAGNOSIS — Z7901 Long term (current) use of anticoagulants: Secondary | ICD-10-CM

## 2023-08-16 LAB — CBC WITH DIFFERENTIAL/PLATELET
Abs Immature Granulocytes: 0.61 10*3/uL — ABNORMAL HIGH (ref 0.00–0.07)
Basophils Absolute: 0.1 10*3/uL (ref 0.0–0.1)
Basophils Relative: 1 %
Eosinophils Absolute: 0.1 10*3/uL (ref 0.0–0.5)
Eosinophils Relative: 1 %
HCT: 28.2 % — ABNORMAL LOW (ref 36.0–46.0)
Hemoglobin: 8.7 g/dL — ABNORMAL LOW (ref 12.0–15.0)
Immature Granulocytes: 8 %
Lymphocytes Relative: 18 %
Lymphs Abs: 1.3 10*3/uL (ref 0.7–4.0)
MCH: 29.7 pg (ref 26.0–34.0)
MCHC: 30.9 g/dL (ref 30.0–36.0)
MCV: 96.2 fL (ref 80.0–100.0)
Monocytes Absolute: 0.4 10*3/uL (ref 0.1–1.0)
Monocytes Relative: 6 %
Neutro Abs: 5 10*3/uL (ref 1.7–7.7)
Neutrophils Relative %: 66 %
Platelets: 242 10*3/uL (ref 150–400)
RBC: 2.93 MIL/uL — ABNORMAL LOW (ref 3.87–5.11)
RDW: 16.6 % — ABNORMAL HIGH (ref 11.5–15.5)
WBC: 7.5 10*3/uL (ref 4.0–10.5)
nRBC: 1.2 % — ABNORMAL HIGH (ref 0.0–0.2)

## 2023-08-16 LAB — URINALYSIS, W/ REFLEX TO CULTURE (INFECTION SUSPECTED)
Bilirubin Urine: NEGATIVE
Glucose, UA: NEGATIVE mg/dL
Hgb urine dipstick: NEGATIVE
Ketones, ur: NEGATIVE mg/dL
Leukocytes,Ua: NEGATIVE
Nitrite: NEGATIVE
Protein, ur: NEGATIVE mg/dL
Specific Gravity, Urine: 1.005 (ref 1.005–1.030)
pH: 6 (ref 5.0–8.0)

## 2023-08-16 LAB — COMPREHENSIVE METABOLIC PANEL
ALT: 15 U/L (ref 0–44)
AST: 22 U/L (ref 15–41)
Albumin: 2 g/dL — ABNORMAL LOW (ref 3.5–5.0)
Alkaline Phosphatase: 300 U/L — ABNORMAL HIGH (ref 38–126)
Anion gap: 8 (ref 5–15)
BUN: 16 mg/dL (ref 8–23)
CO2: 27 mmol/L (ref 22–32)
Calcium: 8.2 mg/dL — ABNORMAL LOW (ref 8.9–10.3)
Chloride: 104 mmol/L (ref 98–111)
Creatinine, Ser: 0.82 mg/dL (ref 0.44–1.00)
GFR, Estimated: >60 mL/min (ref >60)
Glucose, Bld: 93 mg/dL (ref 70–99)
Potassium: 3.4 mmol/L — ABNORMAL LOW (ref 3.5–5.1)
Sodium: 139 mmol/L (ref 135–145)
Total Bilirubin: 0.4 mg/dL (ref <1.2)
Total Protein: 4.9 g/dL — ABNORMAL LOW (ref 6.5–8.1)

## 2023-08-16 LAB — PROTIME-INR
INR: 1.5 — ABNORMAL HIGH (ref 0.8–1.2)
Prothrombin Time: 18.7 s — ABNORMAL HIGH (ref 11.4–15.2)

## 2023-08-16 LAB — I-STAT CG4 LACTIC ACID, ED: Lactic Acid, Venous: 1.5 mmol/L (ref 0.5–1.9)

## 2023-08-16 MED ORDER — POTASSIUM CHLORIDE CRYS ER 20 MEQ PO TBCR
40.0000 meq | EXTENDED_RELEASE_TABLET | Freq: Once | ORAL | Status: AC
  Administered 2023-08-16: 40 meq via ORAL
  Filled 2023-08-16: qty 2

## 2023-08-16 MED ORDER — SODIUM CHLORIDE 0.9 % IV SOLN
2.0000 g | INTRAVENOUS | Status: DC
  Administered 2023-08-16 – 2023-08-18 (×3): 2 g via INTRAVENOUS
  Filled 2023-08-16 (×3): qty 20

## 2023-08-16 MED ORDER — POLYETHYLENE GLYCOL 3350 17 G PO PACK: 17.0000 g | PACK | Freq: Every day | ORAL | Status: DC | PRN

## 2023-08-16 MED ORDER — BISACODYL 10 MG RE SUPP: 10.0000 mg | Freq: Every day | RECTAL | Status: DC | PRN

## 2023-08-16 MED ORDER — VANCOMYCIN HCL IN DEXTROSE 1-5 GM/200ML-% IV SOLN
1000.0000 mg | INTRAVENOUS | Status: DC
  Administered 2023-08-17: 1000 mg via INTRAVENOUS
  Filled 2023-08-16: qty 200

## 2023-08-16 MED ORDER — MORPHINE SULFATE ER 15 MG PO TBCR
60.0000 mg | EXTENDED_RELEASE_TABLET | Freq: Two times a day (BID) | ORAL | Status: DC
  Administered 2023-08-16 – 2023-08-19 (×6): 60 mg via ORAL
  Filled 2023-08-16 (×6): qty 4

## 2023-08-16 MED ORDER — VANCOMYCIN HCL 1.5 G IV SOLR
1500.0000 mg | Freq: Once | INTRAVENOUS | Status: AC
  Administered 2023-08-16: 1500 mg via INTRAVENOUS
  Filled 2023-08-16: qty 30

## 2023-08-16 MED ORDER — ACETAMINOPHEN 325 MG PO TABS: 650.0000 mg | ORAL_TABLET | Freq: Four times a day (QID) | ORAL | Status: DC | PRN

## 2023-08-16 MED ORDER — OXYCODONE HCL 5 MG PO TABS
10.0000 mg | ORAL_TABLET | ORAL | Status: DC | PRN
  Administered 2023-08-16 – 2023-08-18 (×5): 10 mg via ORAL
  Filled 2023-08-16 (×5): qty 2

## 2023-08-16 MED ORDER — SENNOSIDES-DOCUSATE SODIUM 8.6-50 MG PO TABS
1.0000 | ORAL_TABLET | Freq: Two times a day (BID) | ORAL | Status: DC
  Administered 2023-08-16 – 2023-08-19 (×6): 1 via ORAL
  Filled 2023-08-16 (×6): qty 1

## 2023-08-16 MED ORDER — ENOXAPARIN SODIUM 40 MG/0.4ML IJ SOSY: 40.0000 mg | PREFILLED_SYRINGE | INTRAMUSCULAR | Status: DC

## 2023-08-16 MED ORDER — ACETAMINOPHEN 650 MG RE SUPP: 650.0000 mg | Freq: Four times a day (QID) | RECTAL | Status: DC | PRN

## 2023-08-16 MED ORDER — APIXABAN 5 MG PO TABS
5.0000 mg | ORAL_TABLET | Freq: Two times a day (BID) | ORAL | Status: DC
  Administered 2023-08-16 – 2023-08-19 (×6): 5 mg via ORAL
  Filled 2023-08-16 (×6): qty 1

## 2023-08-16 MED ORDER — FUROSEMIDE 10 MG/ML IJ SOLN
40.0000 mg | Freq: Once | INTRAMUSCULAR | Status: AC
  Administered 2023-08-16: 40 mg via INTRAVENOUS
  Filled 2023-08-16: qty 4

## 2023-08-16 MED ORDER — MORPHINE SULFATE (PF) 2 MG/ML IV SOLN: 2.0000 mg | INTRAVENOUS | Status: DC | PRN

## 2023-08-16 MED ORDER — LIDOCAINE 5 % EX PTCH
1.0000 | MEDICATED_PATCH | CUTANEOUS | Status: DC
  Filled 2023-08-16: qty 1

## 2023-08-16 MED ORDER — ONDANSETRON HCL 4 MG PO TABS: 4.0000 mg | ORAL_TABLET | Freq: Four times a day (QID) | ORAL | Status: DC | PRN

## 2023-08-16 MED ORDER — CYCLOBENZAPRINE HCL 10 MG PO TABS: 10.0000 mg | ORAL_TABLET | Freq: Three times a day (TID) | ORAL | Status: DC | PRN

## 2023-08-16 MED ORDER — OXYCODONE HCL 5 MG PO TABS: 5.0000 mg | ORAL_TABLET | ORAL | Status: DC | PRN

## 2023-08-16 MED ORDER — ONDANSETRON HCL 4 MG/2ML IJ SOLN: 4.0000 mg | Freq: Four times a day (QID) | INTRAMUSCULAR | Status: DC | PRN

## 2023-08-16 NOTE — Progress Notes (Addendum)
Pharmacy Antibiotic Note  CRESSA DISSINGER is a 67 y.o. female admitted on 08/16/2023 with cellulitis.  Pharmacy has been consulted for vancomycin dosing. Vancomycin 1500mg  x1 given in the ED.  Plan: Vancomycin 1000mg  IV every 24 hours (eAUC 437.4, Scr 0.82, Vd 0.5). Goal AUC 400-550. Continue ceftriaxone 2g IV every 24 hours per MD Obtain vancomycin levels as indicated F/u blood cultures, clinical progression, and renal function  Height: 5\' 4"  (162.6 cm) Weight: 90 kg (198 lb 6.6 oz) IBW/kg (Calculated) : 54.7  Temp (24hrs), Avg:98.4 F (36.9 C), Min:97.8 F (36.6 C), Max:98.8 F (37.1 C)  Recent Labs  Lab 08/16/23 1451 08/16/23 1545  WBC 7.5  --   CREATININE 0.82  --   LATICACIDVEN  --  1.5    Estimated Creatinine Clearance: 70.3 mL/min (by C-G formula based on SCr of 0.82 mg/dL).    Allergies  Allergen Reactions   Codeine Nausea And Vomiting    Antimicrobials this admission: Ceftriaxone 12/2 >> Vancomycin 12/2 >>  Microbiology results: 12/2 BCx: in process  Thank you for allowing pharmacy to be a part of this patient's care.  Nicole Kindred, PharmD PGY1 Pharmacy Resident 08/16/2023 5:57 PM

## 2023-08-16 NOTE — ED Triage Notes (Addendum)
PT BIB [facility] from home fro fluid in feet w 10/10 pain bilaterally.  new redness and swelling to bilateral lower legs with blistering x 2 weeks. Wound r/t ruptured blister x 1.5 weeks. Pt has Bilateral pulses. Sensory decreased  Afebrile.  Actively being tx for breast cancer.  Pt is A&O x 4. No hx of DM.

## 2023-08-16 NOTE — Progress Notes (Signed)
Bilateral lower extremity venous duplex has been completed. Preliminary results can be found in CV Proc through chart review.  Results were given to Dr. Rodena Medin.  08/16/23 2:32 PM Olen Cordial RVT

## 2023-08-16 NOTE — ED Notes (Signed)
ED TO INPATIENT HANDOFF REPORT  ED Nurse Name and Phone #: Forde Dandy  [phone] S Name/Age/Gender Kaitlin Baker 67 y.o. female Room/Bed: 021C/021C  Code Status   Code Status: Limited: Do not attempt resuscitation (DNR) -DNR-LIMITED -Do Not Intubate/DNI   Home/SNF/Other Home Patient oriented to: self, place, time, and situation Is this baseline? Yes   Triage Complete: Triage complete  Chief Complaint Cellulitis [L03.90]  Triage Note PT BIB [facility] from home fro fluid in feet w 10/10 pain bilaterally.  new redness and swelling to bilateral lower legs with blistering x 2 weeks. Wound r/t ruptured blister x 1.5 weeks. Pt has Bilateral pulses. Sensory decreased  Afebrile.  Actively being tx for breast cancer.  Pt is A&O x 4. No hx of DM.   Allergies Allergies  Allergen Reactions   Codeine Nausea And Vomiting    Level of Care/Admitting Diagnosis ED Disposition     ED Disposition  Admit   Condition  --   Comment  Hospital Area: [facility] [[id number]]  Level of Care: Med-Surg [16]  May admit patient to [facility] or [facility] if equivalent level of care is available:: Yes  Covid Evaluation: Asymptomatic - no recent exposure (last 10 days) testing not required  Diagnosis: Cellulitis [829562]  Admitting Physician: Glade Lloyd [[id number]]  Attending Physician: Glade Lloyd [[id number]]  Certification:: I certify this patient will need inpatient services for at least 2 midnights  Expected Medical Readiness: 08/18/2023          B Medical/Surgery History Past Medical History:  Diagnosis Date   Arthritis    Breast cancer (HCC)    Family history of breast cancer    High cholesterol    PONV (postoperative nausea and vomiting)    Past Surgical History:  Procedure Laterality Date   CHOLECYSTECTOMY     HUMERUS IM NAIL Right 01/22/2023   Procedure: INTRAMEDULLARY (IM) NAIL HUMERUS;  Surgeon: Yolonda Kida, MD;  Location: [facility];  Service: Orthopedics;   Laterality: Right;  90   neck surgery     plate placed     A IV Location/Drains/Wounds Patient Lines/Drains/Airways Status     Active Line/Drains/Airways     Name Placement date Placement time Site Days   Peripheral IV 08/16/23 20 G 1.88" Anterior;Right Forearm 08/16/23  1436  Forearm  less than 1            Intake/Output Last 24 hours No intake or output data in the 24 hours ending 08/16/23 1618  Labs/Imaging Results for orders placed or performed during the hospital encounter of 08/16/23 (from the past 48 hour(s))  Comprehensive metabolic panel     Status: Abnormal   Collection Time: 08/16/23  2:51 PM  Result Value Ref Range   Sodium 139 135 - 145 mmol/L   Potassium 3.4 (L) 3.5 - 5.1 mmol/L   Chloride 104 98 - 111 mmol/L   CO2 27 22 - 32 mmol/L   Glucose, Bld 93 70 - 99 mg/dL    Comment: Glucose reference range applies only to samples taken after fasting for at least 8 hours.   BUN 16 8 - 23 mg/dL   Creatinine, Ser 5.28 0.44 - 1.00 mg/dL   Calcium 8.2 (L) 8.9 - 10.3 mg/dL   Total Protein 4.9 (L) 6.5 - 8.1 g/dL   Albumin 2.0 (L) 3.5 - 5.0 g/dL   AST 22 15 - 41 U/L   ALT 15 0 - 44 U/L   Alkaline Phosphatase 300 (H)  38 - 126 U/L   Total Bilirubin 0.4 <1.2 mg/dL   GFR, Estimated >16 >10 mL/min    Comment: (NOTE) Calculated using the CKD-EPI Creatinine Equation (2021)    Anion gap 8 5 - 15    Comment: Performed at [facility], [address], [place], [place] [address]  CBC with Differential     Status: Abnormal   Collection Time: 08/16/23  2:51 PM  Result Value Ref Range   WBC 7.5 4.0 - 10.5 K/uL   RBC 2.93 (L) 3.87 - 5.11 MIL/uL   Hemoglobin 8.7 (L) 12.0 - 15.0 g/dL   HCT 40.9 (L) 81.1 - 91.4 %   MCV 96.2 80.0 - 100.0 fL   MCH 29.7 26.0 - 34.0 pg   MCHC 30.9 30.0 - 36.0 g/dL   RDW 78.2 (H) 95.6 - 21.3 %   Platelets 242 150 - 400 K/uL   nRBC 1.2 (H) 0.0 - 0.2 %   Neutrophils Relative % 66 %   Neutro Abs 5.0 1.7 - 7.7 K/uL   Lymphocytes Relative 18  %   Lymphs Abs 1.3 0.7 - 4.0 K/uL   Monocytes Relative 6 %   Monocytes Absolute 0.4 0.1 - 1.0 K/uL   Eosinophils Relative 1 %   Eosinophils Absolute 0.1 0.0 - 0.5 K/uL   Basophils Relative 1 %   Basophils Absolute 0.1 0.0 - 0.1 K/uL   Immature Granulocytes 8 %   Abs Immature Granulocytes 0.61 (H) 0.00 - 0.07 K/uL    Comment: Performed at [facility], [address] [place], [place] [address]  Protime-INR     Status: Abnormal   Collection Time: 08/16/23  2:51 PM  Result Value Ref Range   Prothrombin Time 18.7 (H) 11.4 - 15.2 seconds   INR 1.5 (H) 0.8 - 1.2    Comment: (NOTE) INR goal varies based on device and disease states. Performed at [facility], [address], [place], [place] [address]   I-Stat Lactic Acid, ED     Status: None   Collection Time: 08/16/23  3:45 PM  Result Value Ref Range   Lactic Acid, Venous 1.5 0.5 - 1.9 mmol/L   VAS [place] LOWER EXTREMITY VENOUS (DVT) (7a-7p)  Result Date: 08/16/2023  Lower Venous DVT Study Patient Name:  Kaitlin Baker [facility]  Date of Exam:   08/16/2023 Medical Rec #: [id number]       Accession #:    [id number] Date of Birth: [birth year]       Patient Gender: F Patient Age:   9 years Exam Location:  [facility] Procedure:      VAS [place] LOWER EXTREMITY VENOUS (DVT) Referring Phys: Theron Arista MESSICK --------------------------------------------------------------------------------  Indications: Edema.  Risk Factors: None identified. Limitations: Poor ultrasound/tissue interface, body habitus and patient positioning. Comparison Study: No prior studies. Performing Technologist: Chanda Busing RVT  Examination Guidelines: A complete evaluation includes B-mode imaging, spectral Doppler, color Doppler, and power Doppler as needed of all accessible portions of each vessel. Bilateral testing is considered an integral part of a complete examination. Limited examinations for reoccurring indications may be performed as noted. The reflux portion of the  exam is performed with the patient in reverse Trendelenburg.  +---------+---------------+---------+-----------+----------+--------------+ RIGHT    CompressibilityPhasicitySpontaneityPropertiesThrombus Aging +---------+---------------+---------+-----------+----------+--------------+ CFV      Full           Yes      Yes                                 +---------+---------------+---------+-----------+----------+--------------+  SFJ      Full                                                        +---------+---------------+---------+-----------+----------+--------------+ FV Prox  Full                                                        +---------+---------------+---------+-----------+----------+--------------+ FV Mid                  Yes      Yes                                 +---------+---------------+---------+-----------+----------+--------------+ FV Distal               Yes      Yes                                 +---------+---------------+---------+-----------+----------+--------------+ PFV      Full                                                        +---------+---------------+---------+-----------+----------+--------------+ POP      Full           Yes      Yes                                 +---------+---------------+---------+-----------+----------+--------------+ PTV      Full                                                        +---------+---------------+---------+-----------+----------+--------------+ PERO     Full                                                        +---------+---------------+---------+-----------+----------+--------------+   +---------+---------------+---------+-----------+----------+-------------------+ LEFT     CompressibilityPhasicitySpontaneityPropertiesThrombus Aging      +---------+---------------+---------+-----------+----------+-------------------+ CFV      Full           Yes      Yes                                       +---------+---------------+---------+-----------+----------+-------------------+ SFJ      Full                                                             +---------+---------------+---------+-----------+----------+-------------------+  FV Prox  Full                                                             +---------+---------------+---------+-----------+----------+-------------------+ FV Mid                  Yes      Yes                                      +---------+---------------+---------+-----------+----------+-------------------+ FV Distal               Yes      Yes                                      +---------+---------------+---------+-----------+----------+-------------------+ PFV      Full                                                             +---------+---------------+---------+-----------+----------+-------------------+ POP      Full           Yes      Yes                                      +---------+---------------+---------+-----------+----------+-------------------+ PTV      Full                                                             +---------+---------------+---------+-----------+----------+-------------------+ PERO                                                  Not well visualized +---------+---------------+---------+-----------+----------+-------------------+     Summary: RIGHT: - There is no evidence of deep vein thrombosis in the lower extremity. However, portions of this examination were limited- see technologist comments above.  - No cystic structure found in the popliteal fossa.  LEFT: - There is no evidence of deep vein thrombosis in the lower extremity. However, portions of this examination were limited- see technologist comments above.  - No cystic structure found in the popliteal fossa.  *See table(s) above for measurements and observations. Electronically signed by Gerarda Fraction on 08/16/2023 at 4:08:00 PM.    Final    DG Foot Complete Left  Result Date: 08/16/2023 CLINICAL DATA:  Pain, swelling, redness, bilaterally, open wound on left foot EXAM: LEFT FOOT - COMPLETE 3+ VIEW; RIGHT FOOT COMPLETE - 3+ VIEW COMPARISON:  None Available. FINDINGS: Left: Marked diffuse swelling about the left foot. No evidence of osteomyelitis. No acute fracture or dislocation. Soft  tissue ulcer about the dorsum of the foot. Right: Diffuse soft tissue swelling about the right foot. No acute fracture or dislocation. No evidence of osteomyelitis. IMPRESSION: Diffuse soft tissue swelling about the bilateral feet, left greater than right. No evidence of osteomyelitis. Electronically Signed   By: Minerva Fester M.D.   On: 08/16/2023 16:06   DG Foot Complete Right  Result Date: 08/16/2023 CLINICAL DATA:  Pain, swelling, redness, bilaterally, open wound on left foot EXAM: LEFT FOOT - COMPLETE 3+ VIEW; RIGHT FOOT COMPLETE - 3+ VIEW COMPARISON:  None Available. FINDINGS: Left: Marked diffuse swelling about the left foot. No evidence of osteomyelitis. No acute fracture or dislocation. Soft tissue ulcer about the dorsum of the foot. Right: Diffuse soft tissue swelling about the right foot. No acute fracture or dislocation. No evidence of osteomyelitis. IMPRESSION: Diffuse soft tissue swelling about the bilateral feet, left greater than right. No evidence of osteomyelitis. Electronically Signed   By: Minerva Fester M.D.   On: 08/16/2023 16:06   DG Chest Port 1 View  Result Date: 08/16/2023 CLINICAL DATA:  Shortness of breath.  Bilateral lower leg swelling. EXAM: PORTABLE CHEST 1 VIEW COMPARISON:  CT chest 05/04/2023 and radiographs 12/07/2010 FINDINGS: Stable cardiomediastinal silhouette. Aortic atherosclerotic calcification. Chronic bronchitic changes. Platelike atelectasis in the left lower lung. The lungs are otherwise clear. No pleural effusion or pneumothorax. Postoperative changes right humerus.  Cervical spine fusion hardware. Bilateral remote rib fractures. 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 IMPRESSION: 1. No acute cardiopulmonary disease. 2. 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 Electronically Signed   By: Minerva Fester M.D.   On: 08/16/2023 16:03    Pending Labs Unresulted Labs (From admission, onward)     Start     Ordered   08/17/23 0500  CBC  Tomorrow morning,   R       Question:  Specimen collection method  Answer:  IV Team=IV Team collect   08/16/23 1606   08/17/23 0500  Comprehensive metabolic panel  Tomorrow morning,   R       Question:  Specimen collection method  Answer:  IV Team=IV Team collect   08/16/23 1606   08/17/23 0500  Magnesium  Tomorrow morning,   R       Question:  Specimen collection method  Answer:  IV Team=IV Team collect   08/16/23 1606   08/16/23 1228  Culture, blood (Routine x 2)  BLOOD CULTURE X 2,   R (with STAT occurrences)      08/16/23 1227   08/16/23 1228  Urinalysis, w/ Reflex to Culture (Infection Suspected) -Urine, Clean Catch  Once,   URGENT       Question:  Specimen Source  Answer:  Urine, Clean Catch   08/16/23 1227            Vitals/Pain Today's Vitals   08/16/23 1300 08/16/23 1500 08/16/23 1514 08/16/23 1515  BP: 111/60   (!) 117/56  Pulse: (!) 107 (!) 109 (!) 111 (!) 110  Resp:    17  Temp:   98.7 F (37.1 C)   TempSrc:   Oral   SpO2: 97% 100% 100% 98%  PainSc:  6       Isolation Precautions No active isolations  Medications Medications  Vancomycin (VANCOCIN) 1,500 mg in sodium chloride 0.9 % 500 mL IVPB (has no administration in time range)  cyclobenzaprine (FLEXERIL) tablet 10 mg (has no administration in time range)  lidocaine (LIDODERM) 5 % 1 patch (  has no administration in time range)  enoxaparin (LOVENOX) injection 40 mg (has no administration in time range)  acetaminophen (TYLENOL) tablet 650 mg (has no administration in time range)    Or  acetaminophen  (TYLENOL) suppository 650 mg (has no administration in time range)  oxyCODONE (Oxy IR/ROXICODONE) immediate release tablet 5-10 mg (has no administration in time range)  morphine (PF) 2 MG/ML injection 2 mg (has no administration in time range)  ondansetron (ZOFRAN) tablet 4 mg (has no administration in time range)    Or  ondansetron (ZOFRAN) injection 4 mg (has no administration in time range)  polyethylene glycol (MIRALAX / GLYCOLAX) packet 17 g (has no administration in time range)  senna-docusate (Senokot-S) tablet 1 tablet (has no administration in time range)  bisacodyl (DULCOLAX) suppository 10 mg (has no administration in time range)  potassium chloride SA (KLOR-CON M) CR tablet 40 mEq (has no administration in time range)  furosemide (LASIX) injection 40 mg (has no administration in time range)  cefTRIAXone (ROCEPHIN) 2 g in sodium chloride 0.9 % 100 mL IVPB (has no administration in time range)    Mobility .     Focused Assessments Cardiac Assessment Handoff:    No results found for: "CKTOTAL", "CKMB", "CKMBINDEX", "TROPONINI" No results found for: "DDIMER" Does the Patient currently have chest pain? No    R Recommendations: See Admitting Provider Note  Report given to:   Additional Notes: .

## 2023-08-16 NOTE — ED Provider Notes (Signed)
[place] EMERGENCY DEPARTMENT AT [facility] Provider Note   CSN: [id number] Arrival date & time: 08/16/23  1148     History {Add pertinent medical, surgical, social history, OB history to HPI:1} No chief complaint on file.   Kaitlin Baker is a 67 y.o. female.  67 year old female with prior medical history as detailed below presents for evaluation.  Patient presents from home with complaint of increasing edema and pain to bilateral feet.  Patient reports that the pain has become significant over the last 24 to 48 hours she has been unable to ambulate secondary to same.  She has noted new redness and swelling to both lower extremities x 2 to 3 days.  She denies fever.  She does have a ruptured blister to the dorsal aspect of the left foot.  The history is provided by the patient and medical records.       Home Medications Prior to Admission medications   Medication Sig Start Date End Date Taking? Authorizing Provider  acetaminophen (TYLENOL) 500 MG tablet Take 2 tablets (1,000 mg total) by mouth 3 (three) times daily. 05/07/23   Masters, Katie, DO  cyclobenzaprine (FLEXERIL) 10 MG tablet Take 10 mg by mouth every 8 (eight) hours as needed for muscle spasms.    [provider]  dexamethasone (DECADRON) 4 MG tablet Take 4.5 mg by mouth daily.    [provider]  lidocaine (LIDODERM) 5 % Place 1 patch onto the skin daily. Remove & Discard patch within 12 hours or as directed by MD 05/07/23   Masters, Florentina Addison, DO  morphine (MS CONTIN) 60 MG 12 hr tablet Take 60 mg by mouth every 12 (twelve) hours.    [provider]  Oxycodone HCl 10 MG TABS Take 10 mg by mouth every 6 (six) hours as needed.    [provider]  prochlorperazine (COMPAZINE) 10 MG tablet Take 1 tablet (10 mg total) by mouth every 6 (six) hours as needed for nausea or vomiting. 02/15/23   [name], MD  senna (SENOKOT) 8.6 MG TABS tablet Take 1 tablet (8.6 mg total) by  mouth 2 (two) times daily. 05/07/23   Masters, Florentina Addison, DO      Allergies    Codeine    Review of Systems   Review of Systems  All other systems reviewed and are negative.   Physical Exam Updated Vital Signs BP 102/88   Pulse (!) 112   Temp 97.8 F (36.6 C) (Oral)   Resp 20   SpO2 95%  Physical Exam Vitals and nursing note reviewed.  Constitutional:      General: She is not in acute distress.    Appearance: Normal appearance. She is well-developed.  HENT:     Head: Normocephalic and atraumatic.  Eyes:     Conjunctiva/sclera: Conjunctivae normal.     Pupils: Pupils are equal, round, and reactive to light.  Cardiovascular:     Rate and Rhythm: Normal rate and regular rhythm.     Heart sounds: Normal heart sounds.  Pulmonary:     Effort: Pulmonary effort is normal. No respiratory distress.     Breath sounds: Normal breath sounds.  Abdominal:     General: There is no distension.     Palpations: Abdomen is soft.     Tenderness: There is no abdominal tenderness.  Musculoskeletal:        General: No deformity. Normal range of motion.     Cervical back: Normal range of motion and neck  supple.     Right lower leg: Edema present.     Left lower leg: Edema present.     Comments: Significant edema and erythema to the distal portions of bilateral extremities.  See images below.  Skin:    General: Skin is warm and dry.  Neurological:     General: No focal deficit present.     Mental Status: She is alert and oriented to person, place, and time.     ED Results / Procedures / Treatments   Labs (all labs ordered are listed, but only abnormal results are displayed) Labs Reviewed  CULTURE, BLOOD (ROUTINE X 2)  CULTURE, BLOOD (ROUTINE X 2)  COMPREHENSIVE METABOLIC PANEL  CBC WITH DIFFERENTIAL/PLATELET  PROTIME-INR  URINALYSIS, W/ REFLEX TO CULTURE (INFECTION SUSPECTED)  I-STAT CG4 LACTIC ACID, ED    EKG None  Radiology No results found.  Procedures Procedures   {Document cardiac monitor, telemetry assessment procedure when appropriate:1}  Medications Ordered in ED Medications  Vancomycin (VANCOCIN) 1,500 mg in sodium chloride 0.9 % 500 mL IVPB (has no administration in time range)    ED Course/ Medical Decision Making/ A&P   {   Click here for ABCD2, HEART and other calculatorsREFRESH Note before signing :1}                              Medical Decision Making Amount and/or Complexity of Data Reviewed Radiology: ordered.    Medical Screen Complete  This patient presented to the ED with complaint of ***.  This complaint involves an extensive number of treatment options. The initial differential diagnosis includes, but is not limited to, ***  This presentation is: {IllnessRisk:19196::"***","Acute","Chronic","Self-Limited","Previously Undiagnosed","Uncertain Prognosis","Complicated","Systemic Symptoms","Threat to Life/Bodily Function"}    Co morbidities that complicated the patient's evaluation  ***   Additional history obtained:  Additional history obtained from {History source:19196::"EMS","Spouse","Family","Friend","Caregiver"} External records from outside sources obtained and reviewed including prior ED visits and prior Inpatient records.    Lab Tests:  I ordered and personally interpreted labs.  The pertinent results include:  ***   Imaging Studies ordered:  I ordered imaging studies including ***  I independently visualized and interpreted obtained imaging which showed *** I agree with the radiologist interpretation.   Cardiac Monitoring:  The patient was maintained on a cardiac monitor.  I personally viewed and interpreted the cardiac monitor which showed an underlying rhythm of: ***   Medicines ordered:  I ordered medication including ***  for ***  Reevaluation of the patient after these medicines showed that the patient: {resolved/improved/worsened:23923::"improved"}    Test  Considered:  ***   Critical Interventions:  ***   Consultations Obtained:  I consulted ***,  and discussed lab and imaging findings as well as pertinent plan of care.    Problem List / ED Course:  ***   Reevaluation:  After the interventions noted above, I reevaluated the patient and found that they have: {resolved/improved/worsened:23923::"improved"}   Social Determinants of Health:  ***   Disposition:  After consideration of the diagnostic results and the patients response to treatment, I feel that the patent would benefit from ***.    {Document critical care time when appropriate:1} {Document review of labs and clinical decision tools ie heart score, Chads2Vasc2 etc:1}  {Document your independent review of radiology images, and any outside records:1} {Document your discussion with family members, caretakers, and with consultants:1} {Document social determinants of health affecting pt's care:1} {Document your decision making why  or why not admission, treatments were needed:1} Final Clinical Impression(s) / ED Diagnoses Final diagnoses:  None    Rx / DC Orders ED Discharge Orders     None

## 2023-08-16 NOTE — Progress Notes (Signed)
ED Pharmacy Antibiotic Sign Off An antibiotic consult was received from an ED provider for vancomycin per pharmacy dosing for cellulitis. A chart review was completed to assess appropriateness.   The following one time order(s) were placed:  Vancomycin 1500 mg IV x 1  Further antibiotic and/or antibiotic pharmacy consults should be ordered by the admitting provider if indicated.   Thank you for allowing pharmacy to be a part of this patient's care.   Daylene Posey, [facility]  Clinical Pharmacist 08/16/23 1:01 PM

## 2023-08-16 NOTE — ED Provider Triage Note (Signed)
Emergency Medicine Provider Triage Evaluation Note  Kaitlin Baker , a 67 y.o. female  was evaluated in triage.  Pt complains of bilateral leg swelling x 4 weeks, wound on L foot since 1.5 weeks. Legs have been weeping fluid.   Review of Systems  Positive: Leg swelling, wound Negative: Fever, chills  Physical Exam  BP 102/88   Pulse (!) 112   Temp 97.8 F (36.6 C) (Oral)   Resp 20   SpO2 95%  Gen:   Awake, no distress   Resp:  Normal effort  MSK:   Moves extremities without difficulty  Other:  Significant redness and swelling to bilateral legs with large open wound on dorsum of L foot, appears cellulitic and draining  Medical Decision Making  Medically screening exam initiated at 12:26 PM.  Appropriate orders placed.  [name] was informed that the remainder of the evaluation will be completed by another provider, this initial triage assessment does not replace that evaluation, and the importance of remaining in the ED until their evaluation is complete.  Made nursing aware patient will require room urgently. Pt tachycardic with obvious source of infection, will initiate suspected sepsis order set   [name], PA-C 08/16/23 1227

## 2023-08-16 NOTE — H&P (Addendum)
History and Physical    Kaitlin Baker BTD:[id number] DOB: [birth year] DOA: 08/16/2023  PCP: Patient, No Pcp Per   Patient coming from: Home  I have personally briefly reviewed patient's old medical records in [facility]  Chief Complaint: Bilateral lower extremity pain, redness and swelling  HPI: Kaitlin Baker is a 67 y.o. 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  Pain has become significant over the last 24 to 48 hours and she has been unable to ambulate due to the same.  Bilateral lower extremity redness have progressively worsened with increasing swelling.  She denies fever, cough, chest pain, shortness of breath, abdominal pain, diarrhea, dysuria, loss of consciousness, seizures, syncope.  She also had a blister on the left foot which has ruptured.  ED Course: She was found to have significant bilateral lower extremity cellulitis.  WBC of 7.5.  Chest x-ray showed no acute cardiopulmonary disease.  She was started on IV antibiotics. Hospitalist service was called to evaluate the patient.  Review of Systems: As per HPI otherwise all other systems were reviewed and are negative.   Past Medical History:  Diagnosis Date   Arthritis    Breast cancer (HCC)    Family history of breast cancer    High cholesterol    PONV (postoperative nausea and vomiting)     Past Surgical History:  Procedure Laterality Date   CHOLECYSTECTOMY     HUMERUS IM NAIL Right 01/22/2023   Procedure: INTRAMEDULLARY (IM) NAIL HUMERUS;  Surgeon: Yolonda Kida, MD;  Location: [facility];  Service:  Orthopedics;  Laterality: Right;  90   neck surgery     plate placed     reports that she has never smoked. She has never used smokeless tobacco. She reports that she does not drink alcohol and does not use drugs.  Allergies  Allergen Reactions   Codeine Nausea And Vomiting    Family History  Adopted: Yes  Problem Relation Age of Onset   Breast cancer Mother        dx 55s   Brain cancer Father        vs brain tumor, d. 48   Breast cancer Sister        dx 84   Breast cancer Sister        dx 15s   Liver cancer Brother    Alcohol abuse Brother    Asthma Son     Prior to Admission medications   Medication Sig Start Date End Date Taking? Authorizing Provider  acetaminophen (TYLENOL) 500 MG tablet Take 2 tablets (1,000 mg total) by mouth 3 (three) times daily. 05/07/23   Masters, Katie, DO  cyclobenzaprine (FLEXERIL) 10 MG tablet Take 10 mg by mouth every 8 (eight) hours as needed for muscle spasms.    [provider]  dexamethasone (DECADRON) 4 MG tablet Take 4.5 mg by mouth daily.    [provider]  lidocaine (LIDODERM) 5 % Place 1 patch onto the skin daily. Remove & Discard patch within 12 hours or as directed by MD 05/07/23   Masters, Florentina Addison, DO  morphine (MS CONTIN) 60 MG 12 hr tablet Take 60 mg by mouth every 12 (twelve)  hours.    [provider]  Oxycodone HCl 10 MG TABS Take 10 mg by mouth every 6 (six) hours as needed.    [provider]  prochlorperazine (COMPAZINE) 10 MG tablet Take 1 tablet (10 mg total) by mouth every 6 (six) hours as needed for nausea or vomiting. 02/15/23   [name], MD  senna (SENOKOT) 8.6 MG TABS tablet Take 1 tablet (8.6 mg total) by mouth 2 (two) times daily. 05/07/23   Masters, Florentina Addison, DO    Physical Exam: Vitals:   08/16/23 1300 08/16/23 1500 08/16/23 1514 08/16/23 1515  BP: 111/60   (!) 117/56  Pulse: (!) 107 (!) 109 (!) 111 (!) 110  Resp:    17  Temp:   98.7 F (37.1 C)   TempSrc:   Oral    SpO2: 97% 100% 100% 98%    Constitutional: NAD, calm, comfortable.  On room air.  Looks chronically ill and deconditioned. Vitals:   08/16/23 1300 08/16/23 1500 08/16/23 1514 08/16/23 1515  BP: 111/60   (!) 117/56  Pulse: (!) 107 (!) 109 (!) 111 (!) 110  Resp:    17  Temp:   98.7 F (37.1 C)   TempSrc:   Oral   SpO2: 97% 100% 100% 98%   Eyes: PERRL, lids and conjunctivae normal ENMT: Mucous membranes are moist. Posterior pharynx clear of any exudate or lesions. Neck: normal, supple, no masses, no thyromegaly Respiratory: bilateral decreased breath sounds at bases, no wheezing, no crackles. Normal respiratory effort. No accessory muscle use.  Cardiovascular: S1 S2 positive, rate controlled. No extremity edema. 2+ pedal pulses.  Abdomen: no tenderness, no masses palpated. No hepatosplenomegaly. Bowel sounds positive.  Musculoskeletal: no clubbing / cyanosis. No obvious joint deformity upper and lower extremities.  Skin: Bilateral lower extremity erythema, tenderness, edema from below the knee up to the toes with a ruptured blister on the dorsal aspect of the left foot Neurologic: CN 2-12 grossly intact. Moving extremities. No focal neurologic deficits.  Psychiatric: Flat affect.  Not agitated.  Labs on Admission: I have personally reviewed following labs and imaging studies  CBC: Recent Labs  Lab 08/16/23 1451  WBC 7.5  NEUTROABS PENDING  HGB 8.7*  HCT 28.2*  MCV 96.2  PLT 242   Basic Metabolic Panel: Recent Labs  Lab 08/16/23 1451  NA 139  K 3.4*  CL 104  CO2 27  GLUCOSE 93  BUN 16  CREATININE 0.82  CALCIUM 8.2*   GFR: CrCl cannot be calculated (Unknown ideal weight.). Liver Function Tests: Recent Labs  Lab 08/16/23 1451  AST 22  ALT 15  ALKPHOS 300*  BILITOT 0.4  PROT 4.9*  ALBUMIN 2.0*   No results for input(s): "LIPASE", "AMYLASE" in the last 168 hours. No results for input(s): "AMMONIA" in the last 168 hours. Coagulation Profile: Recent Labs   Lab 08/16/23 1451  INR 1.5*   Cardiac Enzymes: No results for input(s): "CKTOTAL", "CKMB", "CKMBINDEX", "TROPONINI" in the last 168 hours. BNP (last 3 results) No results for input(s): "PROBNP" in the last 8760 hours. HbA1C: No results for input(s): "HGBA1C" in the last 72 hours. CBG: No results for input(s): "GLUCAP" in the last 168 hours. Lipid Profile: No results for input(s): "CHOL", "HDL", "LDLCALC", "TRIG", "CHOLHDL", "LDLDIRECT" in the last 72 hours. Thyroid Function Tests: No results for input(s): "TSH", "T4TOTAL", "FREET4", "T3FREE", "THYROIDAB" in the last 72 hours. Anemia Panel: No results for input(s): "VITAMINB12", "FOLATE", "FERRITIN", "TIBC", "IRON", "RETICCTPCT" in the last 72 hours. Urine  analysis:    Component Value Date/Time   COLORURINE YELLOW 06/03/2023 0330   APPEARANCEUR HAZY (A) 06/03/2023 0330   LABSPEC 1.015 06/03/2023 0330   PHURINE 6.0 06/03/2023 0330   GLUCOSEU NEGATIVE 06/03/2023 0330   HGBUR NEGATIVE 06/03/2023 0330   BILIRUBINUR NEGATIVE 06/03/2023 0330   KETONESUR NEGATIVE 06/03/2023 0330   PROTEINUR NEGATIVE 06/03/2023 0330   UROBILINOGEN 0.2 12/03/2010 0737   NITRITE POSITIVE (A) 06/03/2023 0330   LEUKOCYTESUR LARGE (A) 06/03/2023 0330    Radiological Exams on Admission: VAS [place] LOWER EXTREMITY VENOUS (DVT) (7a-7p)  Result Date: 08/16/2023  Lower Venous DVT Study Patient Name:  LATECE BIDGOOD [facility]  Date of Exam:   08/16/2023 Medical Rec #: [id number]       Accession #:    [id number] Date of Birth: 03-Jun-1956       Patient Gender: F Patient Age:   34 years Exam Location:  [facility] Procedure:      VAS [place] LOWER EXTREMITY VENOUS (DVT) Referring Phys: Theron Arista MESSICK --------------------------------------------------------------------------------  Indications: Edema.  Risk Factors: None identified. Limitations: Poor ultrasound/tissue interface, body habitus and patient positioning. Comparison Study: No prior studies. Performing Technologist:  Chanda Busing RVT  Examination Guidelines: A complete evaluation includes B-mode imaging, spectral Doppler, color Doppler, and power Doppler as needed of all accessible portions of each vessel. Bilateral testing is considered an integral part of a complete examination. Limited examinations for reoccurring indications may be performed as noted. The reflux portion of the exam is performed with the patient in reverse Trendelenburg.  +---------+---------------+---------+-----------+----------+--------------+ RIGHT    CompressibilityPhasicitySpontaneityPropertiesThrombus Aging +---------+---------------+---------+-----------+----------+--------------+ CFV      Full           Yes      Yes                                 +---------+---------------+---------+-----------+----------+--------------+ SFJ      Full                                                        +---------+---------------+---------+-----------+----------+--------------+ FV Prox  Full                                                        +---------+---------------+---------+-----------+----------+--------------+ FV Mid                  Yes      Yes                                 +---------+---------------+---------+-----------+----------+--------------+ FV Distal               Yes      Yes                                 +---------+---------------+---------+-----------+----------+--------------+ PFV      Full                                                        +---------+---------------+---------+-----------+----------+--------------+  POP      Full           Yes      Yes                                 +---------+---------------+---------+-----------+----------+--------------+ PTV      Full                                                        +---------+---------------+---------+-----------+----------+--------------+ PERO     Full                                                         +---------+---------------+---------+-----------+----------+--------------+   +---------+---------------+---------+-----------+----------+-------------------+ LEFT     CompressibilityPhasicitySpontaneityPropertiesThrombus Aging      +---------+---------------+---------+-----------+----------+-------------------+ CFV      Full           Yes      Yes                                      +---------+---------------+---------+-----------+----------+-------------------+ SFJ      Full                                                             +---------+---------------+---------+-----------+----------+-------------------+ FV Prox  Full                                                             +---------+---------------+---------+-----------+----------+-------------------+ FV Mid                  Yes      Yes                                      +---------+---------------+---------+-----------+----------+-------------------+ FV Distal               Yes      Yes                                      +---------+---------------+---------+-----------+----------+-------------------+ PFV      Full                                                             +---------+---------------+---------+-----------+----------+-------------------+ POP      Full  Yes      Yes                                      +---------+---------------+---------+-----------+----------+-------------------+ PTV      Full                                                             +---------+---------------+---------+-----------+----------+-------------------+ PERO                                                  Not well visualized +---------+---------------+---------+-----------+----------+-------------------+     Summary: RIGHT: - There is no evidence of deep vein thrombosis in the lower extremity. However, portions of this examination were limited- see technologist  comments above.  - No cystic structure found in the popliteal fossa.  LEFT: - There is no evidence of deep vein thrombosis in the lower extremity. However, portions of this examination were limited- see technologist comments above.  - No cystic structure found in the popliteal fossa.  *See table(s) above for measurements and observations. Electronically signed by Gerarda Fraction on 08/16/2023 at 4:08:00 PM.    Final    DG Chest Port 1 View  Result Date: 08/16/2023 CLINICAL DATA:  Shortness of breath.  Bilateral lower leg swelling. EXAM: PORTABLE CHEST 1 VIEW COMPARISON:  CT chest 05/04/2023 and radiographs 12/07/2010 FINDINGS: Stable cardiomediastinal silhouette. Aortic atherosclerotic calcification. Chronic bronchitic changes. Platelike atelectasis in the left lower lung. The lungs are otherwise clear. No pleural effusion or pneumothorax. Postoperative changes right humerus. Cervical spine fusion hardware. Bilateral remote rib fractures. 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 IMPRESSION: 1. No acute cardiopulmonary disease. 2. 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 Electronically Signed   By: Minerva Fester M.D.   On: 08/16/2023 16:03     Assessment/Plan  Bilateral lower extremity cellulitis -Presented with bilateral lower extremity edema, erythema, tenderness for the last few days progressively getting worse. -Received IV vancomycin in the ED.  Continue IV vancomycin.  Add Rocephin. -Lower extremity duplex negative for DVT -Check 2D echo to rule out any possibility of heart failure although chest x-ray does not show any signs of fluid overload.  Check BNP in AM. -Will give 1 dose of IV Lasix as well.  Strict input and output.  Daily weights.  Fluid restriction.  Left breast cancer with metastasis to bones (spine, ribs, hips) Cancer related pain -Outpatient follow-up with oncology/Dr. Ellin Saba.  Continue pain management. -Consult palliative care -Bowel  regimen  Anemia of chronic disease -From cancer  Hypokalemia -Replace.  Repeat a.m. labs  Elevated alkaline phosphatase -Possibly from bony metastasis.  Physical deconditioning -Will need PT eval  DVT prophylaxis: Lovenox Code Status: DNR Family Communication: None at bedside Disposition Plan: Home in 2 to 3 days once clinically improved Consults called: None Admission status: MedSurg/inpatient  Severity of Illness: The appropriate patient status for this patient is INPATIENT. Inpatient status is judged to be reasonable and necessary in order to provide the required intensity of service to ensure the patient's safety. The patient's presenting symptoms, physical exam findings,  and initial radiographic and laboratory data in the context of their chronic comorbidities is felt to place them at high risk for further clinical deterioration. Furthermore, it is not anticipated that the patient will be medically stable for discharge from the hospital within 2 midnights of admission.   * I certify that at the point of admission it is my clinical judgment that the patient will require inpatient hospital care spanning beyond 2 midnights from the point of admission due to high intensity of service, high risk for further deterioration and high frequency of surveillance required.Glade Lloyd MD [facility]  08/16/2023, 4:08 PM

## 2023-08-17 ENCOUNTER — Inpatient Hospital Stay (HOSPITAL_COMMUNITY): Payer: Medicare Other

## 2023-08-17 DIAGNOSIS — Z7189 Other specified counseling: Secondary | ICD-10-CM

## 2023-08-17 DIAGNOSIS — L03119 Cellulitis of unspecified part of limb: Secondary | ICD-10-CM | POA: Diagnosis not present

## 2023-08-17 DIAGNOSIS — Z515 Encounter for palliative care: Secondary | ICD-10-CM | POA: Diagnosis not present

## 2023-08-17 DIAGNOSIS — R52 Pain, unspecified: Secondary | ICD-10-CM

## 2023-08-17 DIAGNOSIS — R0609 Other forms of dyspnea: Secondary | ICD-10-CM | POA: Diagnosis not present

## 2023-08-17 LAB — ECHOCARDIOGRAM COMPLETE
AR max vel: 1.86 cm2
AV Area VTI: 1.9 cm2
AV Area mean vel: 1.8 cm2
AV Mean grad: 8 mm[Hg]
AV Peak grad: 14 mm[Hg]
Ao pk vel: 1.87 m/s
Area-P 1/2: 4.86 cm2
Height: 64 in
S' Lateral: 3.4 cm
Weight: 3206.37 [oz_av]

## 2023-08-17 LAB — CBC
HCT: 26.4 % — ABNORMAL LOW (ref 36.0–46.0)
Hemoglobin: 8.1 g/dL — ABNORMAL LOW (ref 12.0–15.0)
MCH: 29.3 pg (ref 26.0–34.0)
MCHC: 30.7 g/dL (ref 30.0–36.0)
MCV: 95.7 fL (ref 80.0–100.0)
Platelets: 254 10*3/uL (ref 150–400)
RBC: 2.76 MIL/uL — ABNORMAL LOW (ref 3.87–5.11)
RDW: 17.2 % — ABNORMAL HIGH (ref 11.5–15.5)
WBC: 7.5 10*3/uL (ref 4.0–10.5)
nRBC: 1.2 % — ABNORMAL HIGH (ref 0.0–0.2)

## 2023-08-17 LAB — COMPREHENSIVE METABOLIC PANEL
ALT: 14 U/L (ref 0–44)
AST: 19 U/L (ref 15–41)
Albumin: 1.8 g/dL — ABNORMAL LOW (ref 3.5–5.0)
Alkaline Phosphatase: 255 U/L — ABNORMAL HIGH (ref 38–126)
Anion gap: 11 (ref 5–15)
BUN: 14 mg/dL (ref 8–23)
CO2: 27 mmol/L (ref 22–32)
Calcium: 7.7 mg/dL — ABNORMAL LOW (ref 8.9–10.3)
Chloride: 100 mmol/L (ref 98–111)
Creatinine, Ser: 0.78 mg/dL (ref 0.44–1.00)
GFR, Estimated: >60 mL/min (ref >60)
Glucose, Bld: 98 mg/dL (ref 70–99)
Potassium: 3.2 mmol/L — ABNORMAL LOW (ref 3.5–5.1)
Sodium: 138 mmol/L (ref 135–145)
Total Bilirubin: 0.4 mg/dL (ref <1.2)
Total Protein: 4.6 g/dL — ABNORMAL LOW (ref 6.5–8.1)

## 2023-08-17 LAB — MAGNESIUM: Magnesium: 1.3 mg/dL — ABNORMAL LOW (ref 1.7–2.4)

## 2023-08-17 MED ORDER — DEXAMETHASONE 4 MG PO TABS
4.0000 mg | ORAL_TABLET | Freq: Every day | ORAL | Status: DC
  Administered 2023-08-17 – 2023-08-19 (×3): 4 mg via ORAL
  Filled 2023-08-17 (×3): qty 1

## 2023-08-17 MED ORDER — MAGNESIUM SULFATE 2 GM/50ML IV SOLN
2.0000 g | Freq: Once | INTRAVENOUS | Status: AC
  Administered 2023-08-17: 2 g via INTRAVENOUS
  Filled 2023-08-17: qty 50

## 2023-08-17 MED ORDER — MEDIHONEY WOUND/BURN DRESSING EX PSTE
1.0000 | PASTE | Freq: Every day | CUTANEOUS | Status: DC
  Administered 2023-08-18 – 2023-08-19 (×2): 1 via TOPICAL
  Filled 2023-08-17: qty 44

## 2023-08-17 MED ORDER — PERFLUTREN LIPID MICROSPHERE
1.0000 mL | INTRAVENOUS | Status: AC | PRN
  Administered 2023-08-17: 3 mL via INTRAVENOUS

## 2023-08-17 MED ORDER — FUROSEMIDE 10 MG/ML IJ SOLN
60.0000 mg | Freq: Once | INTRAMUSCULAR | Status: AC
  Administered 2023-08-17: 60 mg via INTRAVENOUS
  Filled 2023-08-17: qty 6

## 2023-08-17 MED ORDER — POTASSIUM CHLORIDE CRYS ER 20 MEQ PO TBCR
40.0000 meq | EXTENDED_RELEASE_TABLET | ORAL | Status: AC
  Administered 2023-08-17 (×2): 40 meq via ORAL
  Filled 2023-08-17 (×2): qty 2

## 2023-08-17 NOTE — Consult Note (Addendum)
Palliative Medicine Inpatient Consult Note  Consulting Provider: Glade Lloyd, MD   Reason for consult:   Palliative Care Consult Services Palliative Medicine Consult  Reason for Consult? Metastatic breast cancer/severe pain/goals of care   08/17/2023  HPI:  Per intake H&P --> Kaitlin Baker is a 67 y.o. 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  Pain has become significant over the last 24 to 48 hours and she has been unable to ambulate due to the same.  Bilateral lower extremity redness have progressively worsened with increasing swelling.  She denies fever, cough, chest pain, shortness of breath, abdominal pain, diarrhea, dysuria, loss of consciousness, seizures, syncope.  She also had a blister on the left foot which has ruptured.   The Palliative care team has been asked to get involved to discuss goals of care.   Clinical Assessment/Goals of Care:  *Please note that this is a verbal dictation therefore any spelling or grammatical errors are due to the "Dragon Medical One" system interpretation.  I have reviewed medical records including EPIC notes, labs and imaging, received report from bedside RN, assessed the patient who is lying in bed in NAD.    I met with Ardelle Balls to further discuss diagnosis prognosis, GOC, EOL wishes, disposition and options.   I introduced Palliative Medicine as specialized medical care for people living with serious illness. It focuses on providing relief from the symptoms and stress of a serious illness. The goal is to improve quality of life for both the  patient and the family.  Medical History Review and Understanding:  Reviewed patient's past medical history significant for metastatic breast cancer, hyperlipidemia, arthritis, multiple orthopedic rod placements to support pathological fractures.  Social History:  Kaitlin Baker shares that she is from [place] originally believed to [place] in 1973.  She shares she has lived in various states throughout her life inclusive of [place] in [place].  She is separated from her husband.  She has 1 son and 1 daughter.  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  She shares that she gets great enjoyment out of camping and prior to about a year ago she was still camping on her own regularly.  She is a woman of faith and practices within Christianity.  Functional and Nutritional State:  Preceding hospitalization, [name] had been living in a trailer and her friends yard.  She shares that she was fully functional up until 3 to 4 days ago when her bilateral lower extremity started swelling.  This made mobility more difficult.  She was still attending to all B ADLs and IADLs.  She was able to eat and drink well.  Palliative Symptoms:  Patient has metastatic cancer pain at baseline which is managed from her perception well with morphine sustained-release 60 mg twice daily and oxycodone 10 mg every 4 hours as needed.  Advance Directives:  A detailed discussion was had today regarding advanced directives.  Patient does have advanced directives on file and would appoint her daughter Dorisann Baker to be her surrogate decision-maker should she become incapacitated for any reason.  Code Status: Concepts specific to code status, artifical feeding and hydration, continued IV antibiotics and rehospitalization was  had.  The difference between a aggressive medical intervention path  and a palliative comfort care path for this patient at this time was had.   Rieley is an established DO NOT RESUSCITATE  DO NOT INTUBATE CODE STATUS  Discussion:  Dary and I discussed her cancer which was identified over a year ago.  She gets treatment through Dr. Ellin Saba for her breast cancer and shares that presently she is taking oral chemo. She feels that overall she has a good quality of life. Her personal goals remain to be able to care for herself.   Aliaa understands the severity of her cancer. She is aware of the potential long term detriments associated with it. She is now supported by [name].   Discussed the importance of continued conversation with family and their  medical providers regarding overall plan of care and treatment options, ensuring decisions are within the context of the patients values and GOCs.  Decision Maker: Kaitlin Baker (Daughter): [phone] (Mobile)   SUMMARY OF RECOMMENDATIONS   DNAR/DNI  Continue current care  Appreciate PT/OT recs (+) thoughts on lymphedema therapy  Appreciate wound care  assessment and recommendations  Continue antibiotics per primary as well as diuresis  Continue OP Palliative services  Metastatic Pain: - Continue Morphine ER 60 BID - Continue oxycodone 10mg  PO Q4H PRN - Continue morphine 2mg /ml PRN breakthrough pain  Ongoing PMT support  Code Status/Advance Care Planning: DNAR/DNI  Palliative Prophylaxis:  Aspiration, Bowel Regimen, Delirium Protocol, Frequent Pain Assessment, Oral Care, Palliative Wound Care, and Turn Reposition  Additional Recommendations (Limitations, Scope, Preferences): Continue current care  Psycho-social/Spiritual:  Desire for further Chaplaincy support: Yes Additional Recommendations: Discussion of disease progression   Prognosis: Recurrent re-hospitalizations. Metastatic cancer place Estrella at a high 12 month mortality risk.   Discharge Planning: Discharge to home once medically optimized.   Vitals:   08/16/23 2348 08/17/23 0331  BP: (!) 116/54 (!) 104/58  Pulse:  (!) 111 (!) 109  Resp: 16 16  Temp: 99.4 F (37.4 C) 98.5 F (36.9 C)  SpO2: 95% 95%    Intake/Output Summary (Last 24 hours) at 08/17/2023 0753 Last data filed at 08/17/2023 7846 Gross per 24 hour  Intake 100 ml  Output 2350 ml  Net -2250 ml   Last Weight  Most recent update: 08/17/2023  3:32 AM    Weight  90.9 kg (200 lb 6.4 oz)            Gen:  Elderly Caucasian F in NAD HEENT: moist mucous membranes CV: Irregular rate and regular rhythm  PULM: On RA, breathing is even and nonlabored ABD: soft/nontender  EXT: BLE edema Neuro: Alert and oriented x3   PPS: 50%   This conversation/these recommendations were discussed with patient primary care team, Dr. Hanley Ben  Billing based on MDM: High  Problems Addressed: One acute or chronic illness or injury that poses a threat to life or bodily function  Amount and/or Complexity of Data: Category 3:Discussion of management or test interpretation with external physician/other qualified health care professional/appropriate source (not separately reported)  Risks: Parenteral controlled substances and Decision not to resuscitate or to de-escalate care because of poor prognosis ______________________________________________________ Lamarr Lulas [place] Palliative Medicine Team Team Cell Phone: [phone] Please utilize secure chat with additional questions, if there is no response within 30 minutes please call the above phone number  Palliative Medicine Team providers are available by phone from 7am to 7pm daily and can be reached through the team cell  phone.  Should this patient require assistance outside of these hours, please call the patient's attending physician.

## 2023-08-17 NOTE — Progress Notes (Signed)
PROGRESS NOTE    Kaitlin Baker  ZHY:[id number] DOB: 1956/06/11 DOA: 08/16/2023 PCP: Patient, No Pcp Per   Brief Narrative:  67 y.o. 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  Recently, she was found to have significant bilateral lower extremity cellulitis.  She was started on IV antibiotics.  Assessment & Plan:   Bilateral lower extremity cellulitis -Presented with bilateral lower extremity edema, erythema, tenderness for the last few days progressively getting worse. -Continue IV vancomycin and Rocephin. -Lower extremity duplex negative for DVT -Check 2D echo to rule out any possibility of heart failure although chest x-ray does not show any signs of fluid overload.   -Received 1 dose of IV Lasix yesterday and has diuresed well.  Will give another dose of IV Lasix today if blood pressure allows.  Strict input and output.  Daily weights.  Fluid restriction.   Left breast cancer with metastasis to bones (spine, ribs, hips) Cancer related pain -Outpatient follow-up with oncology/Dr. Ellin Saba.  Continue pain management. -Consult palliative care -Bowel regimen   Anemia of chronic disease -From cancer.  Hemoglobin stable.  Monitor intermittently  History of recent DVT -Continue Eliquis   Hypokalemia -Labs pending today.  Elevated alkaline phosphatase -Possibly from bony metastasis.   Physical deconditioning -PT eval  Obesity -Outpatient follow-up   DVT prophylaxis: Eliquis  code Status: DNR Family Communication: None at bedside Disposition Plan: Home in 2 to 3 days pending  clinical improvement Status is: Inpatient Remains inpatient appropriate because: Of severity of illness    Consultants: None  Procedures: None  Antimicrobials: Rocephin and vancomycin from 12-24 onwards   Subjective: Seen and examined at bedside.  Still complains of bilateral lower extremity swelling, redness and pain but tightness is improving.  No fever, vomiting, chest pain reported.  Objective: Vitals:   08/16/23 1945 08/16/23 2348 08/17/23 0331 08/17/23 0332  BP: 131/65 (!) 116/54 (!) 104/58   Pulse: (!) 120 (!) 111 (!) 109   Resp: 16 16 16    Temp: 99.7 F (37.6 C) 99.4 F (37.4 C) 98.5 F (36.9 C)   TempSrc: Oral Oral Oral   SpO2: 97% 95% 95%   Weight:    90.9 kg  Height:        Intake/Output Summary (Last 24 hours) at 08/17/2023 0734 Last data filed at 08/17/2023 0659 Gross per 24 hour  Intake 100 ml  Output 2350 ml  Net -2250 ml   Filed Weights   08/16/23 1700 08/17/23 0332  Weight: 90 kg 90.9 kg    Examination:  General exam: Appears calm and comfortable.  On room air.  Looks chronically ill and deconditioned. Respiratory system: Bilateral decreased breath sounds at bases with scattered crackles Cardiovascular system: S1 & S2 heard, Rate controlled currently Gastrointestinal system: Abdomen is obese, nondistended, soft and nontender. Normal bowel sounds heard. Extremities: No cyanosis, clubbing, edema  Central nervous system: Alert and oriented. No focal neurological deficits. Moving extremities Skin: Bilateral lower extremity erythema, tenderness, edema from below the knee up to the toes with a ruptured blister on the dorsal aspect of the left foot  Psychiatry: Mostly flat affect.  Currently not agitated.    Data Reviewed: I have personally reviewed following  labs and imaging studies  CBC: Recent Labs  Lab 08/16/23 1451  WBC 7.5  NEUTROABS 5.0  HGB 8.7*  HCT 28.2*  MCV 96.2  PLT 242   Basic Metabolic Panel: Recent Labs  Lab  08/16/23 1451  NA 139  K 3.4*  CL 104  CO2 27  GLUCOSE 93  BUN 16  CREATININE 0.82  CALCIUM 8.2*   GFR: Estimated Creatinine Clearance: 70.7 mL/min (by C-G formula based on SCr of 0.82 mg/dL). Liver Function Tests: Recent Labs  Lab 08/16/23 1451  AST 22  ALT 15  ALKPHOS 300*  BILITOT 0.4  PROT 4.9*  ALBUMIN 2.0*   No results for input(s): "LIPASE", "AMYLASE" in the last 168 hours. No results for input(s): "AMMONIA" in the last 168 hours. Coagulation Profile: Recent Labs  Lab 08/16/23 1451  INR 1.5*   Cardiac Enzymes: No results for input(s): "CKTOTAL", "CKMB", "CKMBINDEX", "TROPONINI" in the last 168 hours. BNP (last 3 results) No results for input(s): "PROBNP" in the last 8760 hours. HbA1C: No results for input(s): "HGBA1C" in the last 72 hours. CBG: No results for input(s): "GLUCAP" in the last 168 hours. Lipid Profile: No results for input(s): "CHOL", "HDL", "LDLCALC", "TRIG", "CHOLHDL", "LDLDIRECT" in the last 72 hours. Thyroid Function Tests: No results for input(s): "TSH", "T4TOTAL", "FREET4", "T3FREE", "THYROIDAB" in the last 72 hours. Anemia Panel: No results for input(s): "VITAMINB12", "FOLATE", "FERRITIN", "TIBC", "IRON", "RETICCTPCT" in the last 72 hours. Sepsis Labs: Recent Labs  Lab 08/16/23 1545  LATICACIDVEN 1.5    No results found for this or any previous visit (from the past 240 hour(s)).       Radiology Studies: VAS [place] LOWER EXTREMITY VENOUS (DVT) (7a-7p)  Result Date: 08/16/2023  Lower Venous DVT Study Patient Name:  Kaitlin Baker [facility]  Date of Exam:   08/16/2023 Medical Rec #: [id number]       Accession #:    [id number] Date of Birth: [birth year]       Patient Gender: F Patient Age:   54 years Exam Location:  [facility] Procedure:      VAS [place] LOWER EXTREMITY VENOUS (DVT) Referring Phys: Theron Arista MESSICK --------------------------------------------------------------------------------  Indications: Edema.  Risk Factors: None identified.  Limitations: Poor ultrasound/tissue interface, body habitus and patient positioning. Comparison Study: No prior studies. Performing Technologist: Chanda Busing RVT  Examination Guidelines: A complete evaluation includes B-mode imaging, spectral Doppler, color Doppler, and power Doppler as needed of all accessible portions of each vessel. Bilateral testing is considered an integral part of a complete examination. Limited examinations for reoccurring indications may be performed as noted. The reflux portion of the exam is performed with the patient in reverse Trendelenburg.  +---------+---------------+---------+-----------+----------+--------------+ RIGHT    CompressibilityPhasicitySpontaneityPropertiesThrombus Aging +---------+---------------+---------+-----------+----------+--------------+ CFV      Full           Yes      Yes                                 +---------+---------------+---------+-----------+----------+--------------+ SFJ      Full                                                        +---------+---------------+---------+-----------+----------+--------------+ FV Prox  Full                                                        +---------+---------------+---------+-----------+----------+--------------+  FV Mid                  Yes      Yes                                 +---------+---------------+---------+-----------+----------+--------------+ FV Distal               Yes      Yes                                 +---------+---------------+---------+-----------+----------+--------------+ PFV      Full                                                        +---------+---------------+---------+-----------+----------+--------------+ POP      Full           Yes      Yes                                 +---------+---------------+---------+-----------+----------+--------------+ PTV      Full                                                         +---------+---------------+---------+-----------+----------+--------------+ PERO     Full                                                        +---------+---------------+---------+-----------+----------+--------------+   +---------+---------------+---------+-----------+----------+-------------------+ LEFT     CompressibilityPhasicitySpontaneityPropertiesThrombus Aging      +---------+---------------+---------+-----------+----------+-------------------+ CFV      Full           Yes      Yes                                      +---------+---------------+---------+-----------+----------+-------------------+ SFJ      Full                                                             +---------+---------------+---------+-----------+----------+-------------------+ FV Prox  Full                                                             +---------+---------------+---------+-----------+----------+-------------------+ FV Mid                  Yes      Yes                                      +---------+---------------+---------+-----------+----------+-------------------+  FV Distal               Yes      Yes                                      +---------+---------------+---------+-----------+----------+-------------------+ PFV      Full                                                             +---------+---------------+---------+-----------+----------+-------------------+ POP      Full           Yes      Yes                                      +---------+---------------+---------+-----------+----------+-------------------+ PTV      Full                                                             +---------+---------------+---------+-----------+----------+-------------------+ PERO                                                  Not well visualized +---------+---------------+---------+-----------+----------+-------------------+     Summary:  RIGHT: - There is no evidence of deep vein thrombosis in the lower extremity. However, portions of this examination were limited- see technologist comments above.  - No cystic structure found in the popliteal fossa.  LEFT: - There is no evidence of deep vein thrombosis in the lower extremity. However, portions of this examination were limited- see technologist comments above.  - No cystic structure found in the popliteal fossa.  *See table(s) above for measurements and observations. Electronically signed by Gerarda Fraction on 08/16/2023 at 4:08:00 PM.    Final    DG Foot Complete Left  Result Date: 08/16/2023 CLINICAL DATA:  Pain, swelling, redness, bilaterally, open wound on left foot EXAM: LEFT FOOT - COMPLETE 3+ VIEW; RIGHT FOOT COMPLETE - 3+ VIEW COMPARISON:  None Available. FINDINGS: Left: Marked diffuse swelling about the left foot. No evidence of osteomyelitis. No acute fracture or dislocation. Soft tissue ulcer about the dorsum of the foot. Right: Diffuse soft tissue swelling about the right foot. No acute fracture or dislocation. No evidence of osteomyelitis. IMPRESSION: Diffuse soft tissue swelling about the bilateral feet, left greater than right. No evidence of osteomyelitis. Electronically Signed   By: Minerva Fester M.D.   On: 08/16/2023 16:06   DG Foot Complete Right  Result Date: 08/16/2023 CLINICAL DATA:  Pain, swelling, redness, bilaterally, open wound on left foot EXAM: LEFT FOOT - COMPLETE 3+ VIEW; RIGHT FOOT COMPLETE - 3+ VIEW COMPARISON:  None Available. FINDINGS: Left: Marked diffuse swelling about the left foot. No evidence of osteomyelitis. No acute fracture or dislocation. Soft tissue ulcer about the dorsum of the foot. Right: Diffuse soft tissue swelling about  the right foot. No acute fracture or dislocation. No evidence of osteomyelitis. IMPRESSION: Diffuse soft tissue swelling about the bilateral feet, left greater than right. No evidence of osteomyelitis. Electronically Signed    By: Minerva Fester M.D.   On: 08/16/2023 16:06   DG Chest Port 1 View  Result Date: 08/16/2023 CLINICAL DATA:  Shortness of breath.  Bilateral lower leg swelling. EXAM: PORTABLE CHEST 1 VIEW COMPARISON:  CT chest 05/04/2023 and radiographs 12/07/2010 FINDINGS: Stable cardiomediastinal silhouette. Aortic atherosclerotic calcification. Chronic bronchitic changes. Platelike atelectasis in the left lower lung. The lungs are otherwise clear. No pleural effusion or pneumothorax. Postoperative changes right humerus. Cervical spine fusion hardware. Bilateral remote rib fractures. 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 IMPRESSION: 1. No acute cardiopulmonary disease. 2. 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 Electronically Signed   By: Minerva Fester M.D.   On: 08/16/2023 16:03        Scheduled Meds:  apixaban  5 mg Oral BID   lidocaine  1 patch Transdermal Q24H   morphine  60 mg Oral Q12H   senna-docusate  1 tablet Oral BID   Continuous Infusions:  cefTRIAXone (ROCEPHIN)  IV Stopped (08/16/23 1940)   vancomycin            Glade Lloyd, MD [facility] 08/17/2023, 7:34 AM

## 2023-08-17 NOTE — Evaluation (Signed)
Physical Therapy Evaluation Patient Details Name: Kaitlin Baker MRN: [id number] DOB: 03-24-1956 Today's Date: 08/17/2023  History of Present Illness  67 y.o. female presents to [facility] hospital on 08/16/2023 with BLE swelling and erythema, admitted for management of cellulitis. Pt recently underwent bilateral IM nailing of femurs on 07/07/2023 at [facility] 2/2 L femur fx and prophylactic nailing of RLE 2/2 bony mets. PMH includes L breast CA with bony mets to spine, ribs, hips, HLD, OA.  Clinical Impression  Pt presents to PT with deficits in functional mobility, strength, power, endurance, and is limited by pain and swelling in BLE. Pt currently requires physical assistance to mobilize at bed level, and she declines attempts at standing due to concern for BLE pain with weightbearing. PT notes weeping from BLE and assists pt in elevating BLE and floating heels to reduce the risk of pressure injury. PT encourages BLE exercise at bed-level, and assist from staff for bed mobility to sit edge of bed at times throughout the day. PT recommends short term inpatient PT services at the time of discharge as the pt is currently far below her baseline and will need to be able to at least stand and pivot to her power wheelchair in order to effectively mobilize within her camper.       If plan is discharge home, recommend the following: A lot of help with walking and/or transfers;A lot of help with bathing/dressing/bathroom;Assistance with cooking/housework;Assist for transportation;Help with stairs or ramp for entrance   Can travel by private vehicle   No    Equipment Recommendations  (TBD pending progress, pt has limited space for DME in her camper if she elects to go home)  Recommendations for Other Services       Functional Status Assessment Patient has had a recent decline in their functional status and demonstrates the ability to make significant improvements in function in a reasonable and predictable amount of  time.     Precautions / Restrictions Precautions Precautions: Fall Restrictions Weight Bearing Restrictions: Yes RLE Weight Bearing: Weight bearing as tolerated LLE Weight Bearing: Weight bearing as tolerated Other Position/Activity Restrictions: WBAT per discharge summary from [facility]      Mobility  Bed Mobility Overal bed mobility: Needs Assistance Bed Mobility: Supine to Sit, Sit to Supine     Supine to sit: Min assist, HOB elevated, Used rails Sit to supine: Mod assist, HOB elevated, Used rails   General bed mobility comments: assist for LLE mobility in supine to sit as well as use of bed pad to assist in pivot. ModA for BLE support when returning to supine    Transfers Overall transfer level:  (pt defers transfer attempts due to BLE pain, requests to defer until next session)                      Ambulation/Gait                  Stairs            Wheelchair Mobility     Tilt Bed    Modified Rankin (Stroke Patients Only)       Balance Overall balance assessment: Needs assistance Sitting-balance support: No upper extremity supported, Feet supported Sitting balance-Leahy Scale: Good                                       Pertinent Vitals/Pain Pain  Assessment Pain Assessment: 0-10 Pain Score: 3  Pain Location: BLE Pain Descriptors / Indicators: Sore Pain Intervention(s): Premedicated before session    Home Living Family/patient expects to be discharged to:: Private residence Living Arrangements: Alone Available Help at Discharge: Friend(s);Neighbor;Available PRN/intermittently (pt lives on same property as her close friend) 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) Home Access: Stairs to enter Entrance Stairs-Rails: Left Entrance Stairs-Number of Steps: 6   Home Layout: One level Home Equipment: Agricultural consultant (2 wheels);Cane - single point;Toilet riser;Wheelchair - power      Prior Function Prior Level of Function  : Independent/Modified Independent             Mobility Comments: pt reports recently being able to ambulate within her camper with support of RW, has not gone out of the camper due to stairs since surgery at [facility] in october ADLs Comments: pt reports independence with ADLs, assist for IADLs from friend     Extremity/Trunk Assessment   Upper Extremity Assessment Upper Extremity Assessment: RUE deficits/detail RUE Deficits / Details: pt with significant shoulder ROM deficits, reports these have been present since IM nailing of humerus in May    Lower Extremity Assessment Lower Extremity Assessment: RLE deficits/detail;LLE deficits/detail RLE Deficits / Details: pt with BLE erythema, weeping, and pitting edema. Pt with functional AROM at ankles and PROM at knee and hip, AROM limited in supine due to pain. MMT deferred 2/2 pain with light touch, although significant weakness is observed in BLE LLE Deficits / Details: pt with BLE erythema, weeping, and pitting edema. Pt with functional AROM at ankles and PROM at knee and hip, AROM limited in supine due to pain. MMT deferred 2/2 pain with light touch, although significant weakness is observed in BLE    Cervical / Trunk Assessment Cervical / Trunk Assessment: Kyphotic  Communication   Communication Communication: No apparent difficulties Cueing Techniques: Verbal cues  Cognition Arousal: Alert Behavior During Therapy: Anxious (pt expresses some anxiety surrounding potential LE pain with attempts at standing) Overall Cognitive Status: Within Functional Limits for tasks assessed                                          General Comments General comments (skin integrity, edema, etc.): VSS on RA    Exercises     Assessment/Plan    PT Assessment Patient needs continued PT services  PT Problem List Decreased strength;Decreased activity tolerance;Decreased balance;Decreased mobility;Decreased knowledge of use of  DME;Decreased safety awareness;Decreased knowledge of precautions;Impaired sensation;Pain       PT Treatment Interventions DME instruction;Gait training;Stair training;Therapeutic activities;Functional mobility training;Therapeutic exercise;Balance training;Neuromuscular re-education;Patient/family education;Wheelchair mobility training    PT Goals (Current goals can be found in the Care Plan section)  Acute Rehab PT Goals Patient Stated Goal: to return home to camper, return to ambulation PT Goal Formulation: With patient Time For Goal Achievement: 08/31/23 Potential to Achieve Goals: Fair Additional Goals Additional Goal #1: Pt will be able to mobilize in a manual or power wheelchair for 25' to demonstrate the ability to mobilize within her camper    Frequency Min 1X/week     Co-evaluation               AM-PAC PT "6 Clicks" Mobility  Outcome Measure Help needed turning from your back to your side while in a flat bed without using bedrails?: A Little Help needed moving from lying on  your back to sitting on the side of a flat bed without using bedrails?: A Little Help needed moving to and from a bed to a chair (including a wheelchair)?: Total Help needed standing up from a chair using your arms (e.g., wheelchair or bedside chair)?: Total Help needed to walk in hospital room?: Total Help needed climbing 3-5 steps with a railing? : Total 6 Click Score: 10    End of Session   Activity Tolerance: Patient limited by pain (and anxiety related to pain in BLE) Patient left: in bed;with call bell/phone within reach;with bed alarm set Nurse Communication: Mobility status PT Visit Diagnosis: Other abnormalities of gait and mobility (R26.89);Muscle weakness (generalized) (M62.81);Pain Pain - part of body: Leg;Ankle and joints of foot    Time: 0921-0950 PT Time Calculation (min) (ACUTE ONLY): 29 min   Charges:   PT Evaluation $PT Eval Moderate Complexity: 1 Mod   PT General  Charges $$ ACUTE PT VISIT: 1 Visit         Arlyss Gandy, PT, DPT Acute Rehabilitation Office [phone]   Arlyss Gandy 08/17/2023, 12:04 PM

## 2023-08-17 NOTE — Plan of Care (Signed)
  Problem: Education: Goal: Knowledge of General Education information will improve Description: Including pain rating scale, medication(s)/side effects and non-pharmacologic comfort measures Outcome: Progressing   Problem: Health Behavior/Discharge Planning: Goal: Ability to manage health-related needs will improve Outcome: Progressing   Problem: Clinical Measurements: Goal: Ability to maintain clinical measurements within normal limits will improve Outcome: Progressing Goal: Will remain free from infection Outcome: Progressing Goal: Respiratory complications will improve Outcome: Progressing Goal: Cardiovascular complication will be avoided Outcome: Progressing   Problem: Activity: Goal: Risk for activity intolerance will decrease Outcome: Progressing   Problem: Pain Management: Goal: General experience of comfort will improve Outcome: Progressing   Problem: Skin Integrity: Goal: Risk for impaired skin integrity will decrease Outcome: Progressing

## 2023-08-17 NOTE — Progress Notes (Signed)
  Echocardiogram 2D Echocardiogram has been performed.  Kaitlin Baker 08/17/2023, 9:25 AM

## 2023-08-17 NOTE — Consult Note (Addendum)
WOC Nurse Consult Note: Reason for Consult: Requested to assess full thickness on left foot. Perform remotely after seeing the notes and the photos. Wound type: Cellullitis, blister on the left foot, legs were swallowed, red.  Measurement: 5X5X.1 aproxx. Wound bed: 20% Red, 80% inviable tissue color white. Cover bu crust/dead tissue. Drainage (amount, consistency, odor) Low amount, moisture not balance. Periwound: part blister surrounding.  Dressing procedure/placement/frequency: Clean with Vashe [id number], apply Medihoney on the wound bed, change daily. Cover with foam dressing, change every 3 days or if is saturated. Apply Kerlix gauze on the leg, beginning in the end of the toe, until the middle leg, not too tight.  The patient may be benefit with a lymphedema therapy, if the cellulitis was treat.  Lymphedema  Resources (updated 07/2021) Each site requires a referral from your primary care MD [facility] [address] [place], [place]  [phone] (Upper extremities)  [address] [place], [place] [phone] (Lower extremities, PATIENT CAN NOT HAVE A WOUND)  [facility] [address] [place], [place] [address] [phone]  [facility] [address] Medical Office [address]  [place], [place] [phone]  [facility] [address] [place], [place] [address] [phone]  [facility]  (only treatment for lymphedema related to cancer diagnosis) [address]  [place], [place] [address] [phone]    [facility] [address] [place], [place] [address] [phone] [facility] ([facility]) [phone] [address] [place], [place] [address] [phone]  WOC team will not plan to follow further.   Please reconsult if further assistance is needed. Thank-you,  Denyse Amass BSN, RN, [employer], WOC  (Pager: [phone])

## 2023-08-18 ENCOUNTER — Inpatient Hospital Stay: Payer: Medicare Other | Admitting: Hematology

## 2023-08-18 DIAGNOSIS — E669 Obesity, unspecified: Secondary | ICD-10-CM

## 2023-08-18 DIAGNOSIS — L03116 Cellulitis of left lower limb: Secondary | ICD-10-CM

## 2023-08-18 DIAGNOSIS — E66811 Obesity, class 1: Secondary | ICD-10-CM | POA: Insufficient documentation

## 2023-08-18 DIAGNOSIS — Z66 Do not resuscitate: Secondary | ICD-10-CM

## 2023-08-18 DIAGNOSIS — E8809 Other disorders of plasma-protein metabolism, not elsewhere classified: Secondary | ICD-10-CM | POA: Insufficient documentation

## 2023-08-18 DIAGNOSIS — Z515 Encounter for palliative care: Secondary | ICD-10-CM | POA: Diagnosis not present

## 2023-08-18 DIAGNOSIS — L03115 Cellulitis of right lower limb: Secondary | ICD-10-CM

## 2023-08-18 DIAGNOSIS — C50919 Malignant neoplasm of unspecified site of unspecified female breast: Secondary | ICD-10-CM | POA: Diagnosis not present

## 2023-08-18 DIAGNOSIS — Z7189 Other specified counseling: Secondary | ICD-10-CM | POA: Diagnosis not present

## 2023-08-18 LAB — CBC WITH DIFFERENTIAL/PLATELET
Abs Immature Granulocytes: 1 10*3/uL — ABNORMAL HIGH (ref 0.00–0.07)
Basophils Absolute: 0.1 10*3/uL (ref 0.0–0.1)
Basophils Relative: 1 %
Eosinophils Absolute: 0 10*3/uL (ref 0.0–0.5)
Eosinophils Relative: 0 %
HCT: 29.6 % — ABNORMAL LOW (ref 36.0–46.0)
Hemoglobin: 9.6 g/dL — ABNORMAL LOW (ref 12.0–15.0)
Immature Granulocytes: 8 %
Lymphocytes Relative: 10 %
Lymphs Abs: 1.2 10*3/uL (ref 0.7–4.0)
MCH: 29.7 pg (ref 26.0–34.0)
MCHC: 32.4 g/dL (ref 30.0–36.0)
MCV: 91.6 fL (ref 80.0–100.0)
Monocytes Absolute: 0.6 10*3/uL (ref 0.1–1.0)
Monocytes Relative: 5 %
Neutro Abs: 9.3 10*3/uL — ABNORMAL HIGH (ref 1.7–7.7)
Neutrophils Relative %: 76 %
Platelets: 341 10*3/uL (ref 150–400)
RBC: 3.23 MIL/uL — ABNORMAL LOW (ref 3.87–5.11)
RDW: 16.2 % — ABNORMAL HIGH (ref 11.5–15.5)
Smear Review: ADEQUATE
WBC: 12.1 10*3/uL — ABNORMAL HIGH (ref 4.0–10.5)
nRBC: 0.8 % — ABNORMAL HIGH (ref 0.0–0.2)

## 2023-08-18 LAB — BASIC METABOLIC PANEL
Anion gap: 10 (ref 5–15)
BUN: 13 mg/dL (ref 8–23)
CO2: 26 mmol/L (ref 22–32)
Calcium: 8.1 mg/dL — ABNORMAL LOW (ref 8.9–10.3)
Chloride: 101 mmol/L (ref 98–111)
Creatinine, Ser: 0.69 mg/dL (ref 0.44–1.00)
GFR, Estimated: >60 mL/min (ref >60)
Glucose, Bld: 148 mg/dL — ABNORMAL HIGH (ref 70–99)
Potassium: 4.2 mmol/L (ref 3.5–5.1)
Sodium: 137 mmol/L (ref 135–145)

## 2023-08-18 LAB — MAGNESIUM: Magnesium: 1.9 mg/dL (ref 1.7–2.4)

## 2023-08-18 MED ORDER — ADULT MULTIVITAMIN W/MINERALS CH
1.0000 | ORAL_TABLET | Freq: Every day | ORAL | Status: DC
  Administered 2023-08-18 – 2023-08-19 (×2): 1 via ORAL
  Filled 2023-08-18 (×2): qty 1

## 2023-08-18 MED ORDER — BUMETANIDE 0.25 MG/ML IJ SOLN
1.0000 mg | Freq: Once | INTRAMUSCULAR | Status: AC
  Administered 2023-08-18: 1 mg via INTRAVENOUS
  Filled 2023-08-18: qty 4

## 2023-08-18 MED ORDER — FUROSEMIDE 10 MG/ML IJ SOLN: 40.0000 mg | Freq: Once | INTRAMUSCULAR | Status: DC

## 2023-08-18 NOTE — Hospital Course (Addendum)
HPI: Kaitlin Baker is a 67 y.o. 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  Pain has become significant over the last 24 to 48 hours and she has been unable to ambulate due to the same.  Bilateral lower extremity redness have progressively worsened with increasing swelling.  She denies fever, cough, chest pain, shortness of breath, abdominal pain, diarrhea, dysuria, loss of consciousness, seizures, syncope.  She also had a blister on the left foot which has ruptured.   ED Course: She was found to have significant bilateral lower extremity cellulitis.  WBC of 7.5.  Chest x-ray showed no acute cardiopulmonary disease.  She was started on IV antibiotics. Hospitalist service was called to evaluate the patient.  Significant Events: Admitted 08/16/2023 for bilateral LE cellulitis   Significant Labs: Admission WBC 7.5, HgB 8.7, BUN 16, Scr 0.82  Significant Imaging Studies: Admission CXR  No acute cardiopulmonary disease. 2. 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  Antibiotic Therapy: Anti-infectives (From admission, onward)    Start     Dose/Rate Route Frequency Ordered Stop   08/17/23 1600  vancomycin (VANCOCIN) IVPB 1000 mg/200 mL premix        1,000 mg 200 mL/hr over 60 Minutes Intravenous Every 24 hours 08/16/23 1759     08/16/23 1730  cefTRIAXone (ROCEPHIN) 2 g in sodium chloride 0.9 % 100 mL IVPB        2 g 200 mL/hr over 30 Minutes Intravenous Every 24 hours 08/16/23 1607     08/16/23 1315  Vancomycin (VANCOCIN) 1,500 mg in sodium chloride 0.9 % 500 mL IVPB        1,500  mg 250 mL/hr over 120 Minutes Intravenous  Once 08/16/23 1300 08/16/23 1925       Procedures:   Consultants: Palliative care

## 2023-08-18 NOTE — Assessment & Plan Note (Signed)
08-18-2023 BMI 33.3

## 2023-08-18 NOTE — Assessment & Plan Note (Signed)
08-18-2023 edema in her legs may be due to low albumin level of 1.8.   08-19-2023 will DC to home with every other day demadex 20 mg. Will need repeat BMP/Mg level in office in 1 week. Demadex chosen over lasix since lasix needs more albumin to be effective. With pt's serum albumin level low at 1.8, she would do better on demadex.

## 2023-08-18 NOTE — Subjective & Objective (Signed)
Pt seen and examined. Pt states she is ready to go home. No fevers.

## 2023-08-18 NOTE — Progress Notes (Signed)
Palliative Medicine Inpatient Follow Up Note HPI: Kaitlin Baker is a 67 y.o. 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  Pain has become significant over the last 24 to 48 hours and she has been unable to ambulate due to the same.  Bilateral lower extremity redness have progressively worsened with increasing swelling.  She denies fever, cough, chest pain, shortness of breath, abdominal pain, diarrhea, dysuria, loss of consciousness, seizures, syncope.  She also had a blister on the left foot which has ruptured.    The Palliative care team has been asked to get involved to discuss goals of care.   Today's Discussion 08/18/2023  *Please note that this is a verbal dictation therefore any spelling or grammatical errors are due to the "Dragon [employer] One" system interpretation.  Chart reviewed inclusive of vital signs, progress notes, laboratory results, and diagnostic images. Has not required any IV morphine in the last 24 hours.  I met with Kaitlin Baker at bedside this morning. She and I discussed her leg swelling. She shares that she feels improvement and has noticed the swelling has decreased in the setting of antibiotics and lasix. She shares that she can "feel her toes" again. She is motivated this morning to work with the PT/OT teams. She expresses that her home ramp is being built today. She lives within ten feet of her friend "Kaitlin Baker" and her daughter lives in [place]. She desires to go home only and not to a skilled facility.   Created space and opportunity for patient to explore thoughts feelings  and fears regarding current medical situation. She is optimistic for the future and again emphasizes that she does have quality within her life.   From a symptom perspective patients pain is greatly improved. She feels management is good on her present regiment.   Discussed ongoing Palliative support after discharge with Kaitlin Baker.  Patient states the hope to possibly discharge tomorrow.  Questions and concerns addressed/Palliative Support Provided.   Objective Assessment: Vital Signs Vitals:   08/18/23 0414 08/18/23 0802  BP: 112/88 114/60  Pulse: 95 (!) 103  Resp: 18   Temp: 98.2 F (36.8 C)   SpO2: 97% 96%    Intake/Output Summary (Last 24 hours) at 08/18/2023 1914 Last data filed at 08/18/2023 7829 Gross per 24 hour  Intake 840.06 ml  Output 1300 ml  Net -459.94 ml   Last Weight  Most recent update: 08/18/2023  5:46 AM    Weight  88 kg (194 lb)            Gen:  Elderly Caucasian F in NAD HEENT: moist mucous membranes CV: Irregular rate and regular rhythm  PULM: On RA, breathing is even and nonlabored ABD: soft/nontender  EXT: BLE edema Neuro: Alert and oriented x3   SUMMARY OF RECOMMENDATIONS   DNAR/DNI   Continue current care   Appreciate PT/OT continuing to work with the patient   Wound care has evaluated the patient and provided reocmmendations   Continue antibiotics per primary    Continue OP Palliative services through Kaitlin Baker services   Metastatic Pain: - Continue Morphine ER 60 BID - Continue oxycodone 10mg  PO Q4H PRN - Continue morphine 2mg /ml PRN breakthrough pain  Ongoing PMT support  Billing based on MDM: Moderate ______________________________________________________________________________________ Kaitlin Baker [place] Palliative Medicine Team Team Cell Phone: [phone] Please utilize secure chat with additional questions, if there is no response within 30 minutes please call the above phone number  Palliative Medicine  Team providers are available by phone from 7am to 7pm daily and can be reached through the team cell phone.  Should this patient require assistance outside of these hours, please call the patient's attending physician.

## 2023-08-18 NOTE — Assessment & Plan Note (Signed)
08-18-2023 pt seen by palliative care. Pt wants to go home. Has support at home. Already enrolled in home health.

## 2023-08-18 NOTE — Progress Notes (Signed)
Physical Therapy Treatment Patient Details Name: Kaitlin Baker MRN: [id number] DOB: 10-28-1955 Today's Date: 08/18/2023   History of Present Illness 67 y.o. female presents to [facility] hospital on 08/16/2023 with BLE swelling and erythema, admitted for management of cellulitis. Pt recently underwent bilateral IM nailing of femurs on 07/07/2023 at [facility] 2/2 L femur fx and prophylactic nailing of RLE 2/2 bony mets. PMH includes L breast CA with bony mets to spine, ribs, hips, HLD, OA.    PT Comments  Pt tolerates treatment well, progressing to transfer and gait training despite continue pain in feet/legs. Pt does report less significant pain than yesterday, although PT does note the pt with tremors of L foot/ankle upon completion of mobility, and the pt does request pain medication. Pt continues to prefer discharge home as opposed to inpatient PT services. Pt is able to transfer well, and has a power wheelchair to assist in mobility within her camper. Pt also reports assistance available from neighbors/friends as needed. Pt will benefit from home health PT at the time of discharge, no current DME needs. Pt will likely need EMS transport to get into her camper if completion of her ramp is not finished by the time of discharge.    If plan is discharge home, recommend the following: A little help with bathing/dressing/bathroom;Assistance with cooking/housework;Assist for transportation;Help with stairs or ramp for entrance   Can travel by private vehicle     Yes  Equipment Recommendations  None recommended by PT (pt owns necessary DME)    Recommendations for Other Services       Precautions / Restrictions Precautions Precautions: Fall Restrictions Weight Bearing Restrictions: Yes RLE Weight Bearing: Weight bearing as tolerated LLE Weight Bearing: Weight bearing as tolerated Other Position/Activity Restrictions: WBAT per discharge summary from [facility]     Mobility  Bed Mobility Overal bed mobility:  Needs Assistance Bed Mobility: Supine to Sit     Supine to sit: Supervision, HOB elevated          Transfers Overall transfer level: Needs assistance Equipment used: Rolling walker (2 wheels) Transfers: Sit to/from Stand Sit to Stand: Contact guard assist           General transfer comment: increased time and increased trunk flexion    Ambulation/Gait Ambulation/Gait assistance: Contact guard assist Gait Distance (Feet): 15 Feet (additional trial of 5') Assistive device: Rolling walker (2 wheels) Gait Pattern/deviations: Step-to pattern Gait velocity: reduced Gait velocity interpretation: <1.31 ft/sec, indicative of household ambulator   General Gait Details: slowed step-to gait, reduced foot clearance bilaterally   Stairs             Wheelchair Mobility     Tilt Bed    Modified Rankin (Stroke Patients Only)       Balance Overall balance assessment: Needs assistance Sitting-balance support: No upper extremity supported, Feet supported Sitting balance-Leahy Scale: Good     Standing balance support: Bilateral upper extremity supported, Reliant on assistive device for balance Standing balance-Leahy Scale: Poor                              Cognition Arousal: Alert Behavior During Therapy: WFL for tasks assessed/performed Overall Cognitive Status: Within Functional Limits for tasks assessed  Exercises      General Comments General comments (skin integrity, edema, etc.): VSS on RA, PT notes some bleeding from wound site on dorsal L foot, no increase in bleeding noted with ambulation or transfers.      Pertinent Vitals/Pain Pain Assessment Pain Assessment: Faces Faces Pain Scale: Hurts even more Pain Location: L foot Pain Descriptors / Indicators: Sore Pain Intervention(s): Monitored during session    Home Living                          Prior Function             PT Goals (current goals can now be found in the care plan section) Acute Rehab PT Goals Patient Stated Goal: to return home to camper, return to ambulation Progress towards PT goals: Progressing toward goals    Frequency    Min 1X/week      PT Plan      Co-evaluation              AM-PAC PT "6 Clicks" Mobility   Outcome Measure  Help needed turning from your back to your side while in a flat bed without using bedrails?: A Little Help needed moving from lying on your back to sitting on the side of a flat bed without using bedrails?: A Little Help needed moving to and from a bed to a chair (including a wheelchair)?: A Little Help needed standing up from a chair using your arms (e.g., wheelchair or bedside chair)?: A Little Help needed to walk in hospital room?: A Little Help needed climbing 3-5 steps with a railing? : Total 6 Click Score: 16    End of Session Equipment Utilized During Treatment: Gait belt Activity Tolerance: Patient tolerated treatment well Patient left: in chair;with call bell/phone within reach;with chair alarm set Nurse Communication: Mobility status PT Visit Diagnosis: Other abnormalities of gait and mobility (R26.89);Muscle weakness (generalized) (M62.81);Pain Pain - Right/Left: Left Pain - part of body: Ankle and joints of foot     Time: 1610-9604 PT Time Calculation (min) (ACUTE ONLY): 24 min  Charges:    $Gait Training: 8-22 mins $Therapeutic Activity: 8-22 mins PT General Charges $$ ACUTE PT VISIT: 1 Visit                     Arlyss Gandy, PT, DPT Acute Rehabilitation Office [phone]    Arlyss Gandy 08/18/2023, 11:53 AM

## 2023-08-18 NOTE — Evaluation (Signed)
Occupational Therapy Evaluation Patient Details Name: Kaitlin Baker MRN: [id number] DOB: 06-13-1956 Today's Date: 08/18/2023   History of Present Illness 67 y.o. female presents to [facility] hospital on 08/16/2023 with BLE swelling and erythema, admitted for management of cellulitis. Pt recently underwent bilateral IM nailing of femurs on 07/07/2023 at [facility] 2/2 L femur fx and prophylactic nailing of RLE 2/2 bony mets. PMH includes L breast CA with bony mets to spine, ribs, hips, HLD, OA.   Clinical Impression   Pt in good spirits, minimal pain, eager to participate. Pt lives in a camper, in a friend's backyard, available 24/7 if needed. Pt having a ramp installed on 12/5. Pt currently close to baseline with ADLs, increased effort for mobility, steps will be difficult, has been sponge bathing at baseline. Pt has difficulty with reaching BLEs, likely has not been completing proper LB hygiene due to not being able to get into the shower lately. Pt would benefit from [facility] for follow up, Pt states she has all DME needed at home to remain safe. Will continue to see acutely to progress as able.        If plan is discharge home, recommend the following: A little help with walking and/or transfers;A lot of help with bathing/dressing/bathroom;Assistance with cooking/housework;Help with stairs or ramp for entrance;Assist for transportation    Functional Status Assessment  Patient has had a recent decline in their functional status and demonstrates the ability to make significant improvements in function in a reasonable and predictable amount of time.  Equipment Recommendations  None recommended by OT    Recommendations for Other Services       Precautions / Restrictions Precautions Precautions: Fall Restrictions Weight Bearing Restrictions: Yes RLE Weight Bearing: Weight bearing as tolerated LLE Weight Bearing: Weight bearing as tolerated      Mobility Bed Mobility Overal bed mobility: Needs  Assistance Bed Mobility: Supine to Sit, Sit to Supine     Supine to sit: Supervision, HOB elevated Sit to supine: Min assist   General bed mobility comments: supervision for supine to sit, min A for BLEs for sit to supine    Transfers Overall transfer level: Needs assistance Equipment used: Rolling walker (2 wheels) Transfers: Sit to/from Stand Sit to Stand: Contact guard assist           General transfer comment: increased time, CGA      Balance Overall balance assessment: Needs assistance Sitting-balance support: No upper extremity supported, Feet supported Sitting balance-Leahy Scale: Good     Standing balance support: Bilateral upper extremity supported, Reliant on assistive device for balance Standing balance-Leahy Scale: Fair Standing balance comment: able to stand and reach for items unsupported, able to perform standing ADLs, toileting hygiene, brushing teeth, hand hygiene, all unsupported. no LOB                           ADL either performed or assessed with clinical judgement   ADL Overall ADL's : Needs assistance/impaired;At baseline Eating/Feeding: Independent   Grooming: Supervision/safety;Standing   Upper Body Bathing: Set up;Sitting   Lower Body Bathing: Moderate assistance;Sitting/lateral leans   Upper Body Dressing : Set up;Sitting   Lower Body Dressing: Moderate assistance;Sitting/lateral leans   Toilet Transfer: Contact guard assist;Ambulation;Comfort height toilet;Rolling walker (2 wheels)   Toileting- Clothing Manipulation and Hygiene: Set up;Supervision/safety       Functional mobility during ADLs: Contact guard assist;Rolling walker (2 wheels) General ADL Comments: Pt likely at baseline, doing well  with mobility/UB ADLs, set up/supervision, needs help for LB dressing/bathing, wears slip ons at home, does sponge bathing, likely has not been cleaning BLEs well with sponge bathing.     Vision Baseline Vision/History: 1 Wears  glasses Ability to See in Adequate Light: 0 Adequate Patient Visual Report: No change from baseline       Perception         Praxis         Pertinent Vitals/Pain Pain Assessment Pain Assessment: Faces Faces Pain Scale: Hurts even more Pain Location: L foot Pain Descriptors / Indicators: Sore Pain Intervention(s): Monitored during session     Extremity/Trunk Assessment Upper Extremity Assessment Upper Extremity Assessment: RUE deficits/detail RUE Deficits / Details: pt with significant shoulder ROM deficits, reports these have been present since IM nailing of humerus in May RUE: Shoulder pain with ROM RUE Sensation: WNL RUE Coordination: decreased gross motor   Lower Extremity Assessment Lower Extremity Assessment: Defer to PT evaluation       Communication Communication Communication: No apparent difficulties   Cognition Arousal: Alert Behavior During Therapy: WFL for tasks assessed/performed Overall Cognitive Status: Within Functional Limits for tasks assessed                                       General Comments       Exercises     Shoulder Instructions      Home Living Family/patient expects to be discharged to:: Private residence Living Arrangements: Alone Available Help at Discharge: Friend(s);Neighbor;Available PRN/intermittently Type of Home: Other(Comment) (camper) Home Access: Stairs to enter Entrance Stairs-Number of Steps: 6 Entrance Stairs-Rails: Left Home Layout: One level     Bathroom Shower/Tub: Producer, television/film/video: Standard Bathroom Accessibility: No   Home Equipment: Agricultural consultant (2 wheels);Cane - single point;Toilet riser;Wheelchair - power;Shower seat   Additional Comments: Pt lives in friends back yard in a camper, friend is available 24/7.      Prior Functioning/Environment Prior Level of Function : Independent/Modified Independent             Mobility Comments: pt reports recently  being able to ambulate within her camper with support of RW, has not gone out of the camper due to stairs since surgery at [facility] in october ADLs Comments: pt reports independence with ADLs, assist for IADLs from friend, sponge bathes        OT Problem List: Decreased strength;Decreased range of motion;Decreased activity tolerance;Impaired balance (sitting and/or standing);Decreased safety awareness;Pain;Increased edema      OT Treatment/Interventions: Self-care/ADL training;Therapeutic exercise;Energy conservation;DME and/or AE instruction;Manual therapy;Therapeutic activities;Patient/family education    OT Goals(Current goals can be found in the care plan section) Acute Rehab OT Goals Patient Stated Goal: to return home OT Goal Formulation: With patient Time For Goal Achievement: 09/01/23 Potential to Achieve Goals: Good  OT Frequency: Min 1X/week    Co-evaluation              AM-PAC OT "6 Clicks" Daily Activity     Outcome Measure Help from another person eating meals?: None Help from another person taking care of personal grooming?: A Little Help from another person toileting, which includes using toliet, bedpan, or urinal?: A Little Help from another person bathing (including washing, rinsing, drying)?: A Lot Help from another person to put on and taking off regular upper body clothing?: A Little Help from another person to put on and  taking off regular lower body clothing?: A Lot 6 Click Score: 17   End of Session Equipment Utilized During Treatment: Gait belt;Rolling walker (2 wheels) Nurse Communication: Mobility status  Activity Tolerance: Patient tolerated treatment well Patient left: in bed;with call bell/phone within reach  OT Visit Diagnosis: Unsteadiness on feet (R26.81);Other abnormalities of gait and mobility (R26.89);Muscle weakness (generalized) (M62.81);Pain Pain - part of body: Leg;Ankle and joints of foot                Time: 1640-1711 OT Time  Calculation (min): 31 min Charges:  OT General Charges $OT Visit: 1 Visit OT Evaluation $OT Eval Low Complexity: 1 Low OT Treatments $Self Care/Home Management : 8-22 mins  [address], OTR/L   Alexis Goodell 08/18/2023, 5:23 PM

## 2023-08-18 NOTE — Plan of Care (Signed)
  Problem: Education: Goal: Knowledge of General Education information will improve Description: Including pain rating scale, medication(s)/side effects and non-pharmacologic comfort measures Outcome: Progressing   Problem: Clinical Measurements: Goal: Ability to maintain clinical measurements within normal limits will improve Outcome: Progressing   Problem: Elimination: Goal: Will not experience complications related to urinary retention Outcome: Progressing   Problem: Pain Management: Goal: General experience of comfort will improve Outcome: Progressing

## 2023-08-18 NOTE — Assessment & Plan Note (Addendum)
08-18-2023 will stop IV vanco. Continue with rocephin. Continue with diuresis with IV bumex due to low serum albumin level.  08-19-2023 continue with po duricef at home for another 5 days. Will need diuresis with every other day demadex 20 mg.  Needs f/u with PCP in 1 week to monitor left foot wound and repeat BMP/Mg while on diuretics

## 2023-08-18 NOTE — TOC Initial Note (Signed)
Transition of Care [facility]) - Initial/Assessment Note    Patient Details  Name: Kaitlin Baker MRN: [id number] Date of Birth: [birth year]  Transition of Care [facility]) CM/SW Contact:    Sagal Gayton A [place], Theresia Majors Phone Number: 08/18/2023, 9:05 AM  Clinical Narrative:                  CSW met with pt at bedside. Pt has recommendation for SNF at discharge. Pt declined stating she prefer to go home. She stated she was at a facility around late August for 2 weeks and did not have a good experience. She said she lives alone but on her daughters property, "10 feet away." States she thinks she can get around with a walker since the feeling is coming back to her legs.   States she has PT services set up already but could not recall the name of the company.  Also stated she was working to get palliative outpatient services with [name] in [place].  RNCM notified and will follow up for possible home health.   TOC will continue to follow.  Expected Discharge Plan: Home w Home Health Services Barriers to Discharge: Continued Medical Work up   Patient Goals and CMS Choice            Expected Discharge Plan and Services In-house Referral: Clinical Social Work     Living arrangements for the past 2 months: Mobile Home                                      Prior Living Arrangements/Services Living arrangements for the past 2 months: Mobile Home Lives with:: Adult Children              Current home services: Home PT    Activities of Daily Living   ADL Screening (condition at time of admission) Independently performs ADLs?: No Does the patient have a NEW difficulty with bathing/dressing/toileting/self-feeding that is expected to last >3 days?: Yes (Initiates electronic notice to provider for possible OT consult) Does the patient have a NEW difficulty with getting in/out of bed, walking, or climbing stairs that is expected to last >3 days?: Yes (Initiates electronic notice to  provider for possible PT consult) Does the patient have a NEW difficulty with communication that is expected to last >3 days?: No Is the patient deaf or have difficulty hearing?: No Does the patient have difficulty seeing, even when wearing glasses/contacts?: No Does the patient have difficulty concentrating, remembering, or making decisions?: No  Permission Sought/Granted                  Emotional Assessment Appearance:: Appears older than stated age Attitude/Demeanor/Rapport: Engaged Affect (typically observed): Pleasant Orientation: : Oriented to Self, Oriented to  Time, Oriented to Place, Oriented to Situation Alcohol / Substance Use: Not Applicable Psych Involvement: No (comment)  Admission diagnosis:  Cellulitis [L03.90] Cellulitis of lower extremity, unspecified laterality [L03.119] Edema, unspecified type [R60.9] Patient Active Problem List   Diagnosis Date Noted   Cellulitis 08/16/2023   Pathological fracture 07/04/2023   Pathological fracture in neoplastic disease, left femur, initial encounter for fracture (HCC) 07/04/2023   Mild protein-calorie malnutrition (HCC) 05/07/2023   Palliative care patient 05/07/2023   Cancer related pain 05/05/2023   Primary breast cancer with metastasis to other site [facility]) 05/04/2023   Drug-induced constipation 05/04/2023   Genetic testing 02/04/2021   Family history of breast cancer  Malignant neoplasm metastatic to bone [facility]) 07/17/2020   Primary malignant neoplasm of breast with metastasis (HCC) 06/17/2020   PCP:  Patient, No Pcp Per Pharmacy:   [facility] [address] [place] - [address] [place] [address] Phone: [phone] Fax: [phone]     Social Determinants of Health (SDOH) Social History: SDOH Screenings   Food Insecurity: No Food Insecurity (08/16/2023)  Housing: Low Risk  (08/16/2023)  Transportation Needs: No Transportation Needs (08/16/2023)  Utilities: Not At Risk  (08/16/2023)  Alcohol Screen: Low Risk  (07/17/2020)  Depression (PHQ2-9): Low Risk  (07/17/2020)  Financial Resource Strain: Low Risk  (07/17/2020)  Physical Activity: Inactive (07/17/2020)  Social Connections: Unknown (05/20/2023)   Received from [facility]  Stress: No Stress Concern Present (07/17/2020)  Tobacco Use: Low Risk  (08/16/2023)   SDOH Interventions:     Readmission Risk Interventions    05/05/2023    3:00 PM  Readmission Risk Prevention Plan  Post Dischage Appt Complete  Medication Screening Complete  Transportation Screening Complete

## 2023-08-18 NOTE — Progress Notes (Addendum)
Initial Nutrition Assessment  DOCUMENTATION CODES:   Not applicable  INTERVENTION:   -Continue heart healthy diet if well consumed (intakes >75%) otherwise would liberalize to regular.   -Provide protein focused snacks BID.  -Provide Magic cup TID with meals, each supplement provides 290 kcal and 9 grams of protein to help meet estimated needs to support healing.   -MVI/Minerals-1 Tab daily to support wound healing.   NUTRITION DIAGNOSIS:   Increased nutrient needs related to wound healing, chronic illness, other (see comment) (recent fracture and surgery, prolonged hospitalization) as evidenced by estimated needs.   GOAL:   Patient will meet greater than or equal to 90% of their needs    MONITOR:   PO intake, Skin, Labs, Weight trends  REASON FOR ASSESSMENT:   Consult Assessment of nutrition requirement/status  ASSESSMENT: 67 y/o female presented with worsening bilateral lower extremity swelling, pain and redness over about three days prior to admission. Reported significant pain with inability to ambulate. Recent pathological fracture of left femur requiring transfer to [facility] with left femoral intramedullary nailing 07/07/23. Admitted with bilateral lower extremity cellulitis. 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    Intakes recorded 100% x 2 meals. Patient denies any change in her appetite or amount she has been eating prior to admission and in the hospital, appetite is fine. Usual meal pattern B of [employer] sausage sandwich or honey-nut cheerios with milk, mid day meal of sandwich or frozen lean meal, or other meat and vegetable. Snacks include yogurt on regular basis and ice cream. Denies chewing or swallowing food and or liquids. Notes essential tremor, able to eat independently although often consumes cereal in a mug for ease of eating. She notes plenty of help at home. Encouraged patient to eat well  to support healing and lean body mass.  Per review of EMR, no significant weight change over the past 36 months, although edema likely masking current weight, monitor.  Edema 3+ RLE, LLE.  Medications reviewed and include decadron, Senokot, Recephin, Vancomycin.  Labs: potassium 3.2, magnesium 1.3   NUTRITION - FOCUSED PHYSICAL EXAM:  Flowsheet Row Most Recent Value  Orbital Region No depletion  Upper Arm Region No depletion  Thoracic and Lumbar Region No depletion  Buccal Region No depletion  Temple Region No depletion  Clavicle Bone Region No depletion  Clavicle and Acromion Bone Region No depletion  Scapular Bone Region No depletion  Dorsal Hand No depletion  Patellar Region No depletion  Anterior Thigh Region No depletion  Posterior Calf Region No depletion  Edema (RD Assessment) Moderate  [bilateral lower extremity]  Hair Reviewed  Eyes Reviewed  [somewhat pallor eye lids]  Mouth Reviewed  Skin Reviewed  Nails Reviewed  [somewhat pallor nail beds]       Diet Order:   Diet Order             Diet Heart Room service appropriate? Yes; Fluid consistency: Thin; Fluid restriction: 1500 mL Fluid  Diet effective now                   EDUCATION NEEDS:   Education needs have been addressed  Skin:  Skin Assessment: Skin Integrity Issues: Skin Integrity Issues:: Other (Comment) Other: wound with open blister to L foot, wound to R foot, bilateral lower extremity cellulitis  Last BM:  08/15/23  Height:   Ht Readings from Last 1 Encounters:  08/16/23 5\' 4"  (1.626 m)  Weight:   Wt Readings from Last 1 Encounters:  08/18/23 88 kg    Ideal Body Weight:  56.8 kg  BMI:  Body mass index is 33.3 kg/m.  Estimated Nutritional Needs:   Kcal:  1700-1900 kcal/day  Protein:  74-85 gm/day  Fluid:  1700-1900 mL/day    Alvino Chapel, RDLD Clinical Dietitian See AMION for contact information

## 2023-08-18 NOTE — Assessment & Plan Note (Signed)
08-18-2023 palliative care has verified pt's DNR/DNI status.

## 2023-08-18 NOTE — TOC Progression Note (Signed)
Transition of Care [facility]) - Progression Note    Patient Details  Name: Kaitlin Baker MRN: [id number] Date of Birth: July 21, 1956  Transition of Care [facility]) CM/SW Contact  Janae Bridgeman, RN Phone Number: 08/18/2023, 2:38 PM  Clinical Narrative:    Cm spoke with [place], MSW with [facility] and patient plans to return home with continued home health services.  The patient is currently active with [facility] for RN, PT.  I called Kandee Keen, RNCM with [facility] and confirmed current services and new orders were placed to be co-signed by the MD.   Expected Discharge Plan: Home w Home Health Services Barriers to Discharge: Continued Medical Work up  Expected Discharge Plan and Services In-house Referral: Clinical Social Work     Living arrangements for the past 2 months: Mobile Home                                       Social Determinants of Health (SDOH) Interventions SDOH Screenings   Food Insecurity: No Food Insecurity (08/16/2023)  Housing: Low Risk  (08/16/2023)  Transportation Needs: No Transportation Needs (08/16/2023)  Utilities: Not At Risk (08/16/2023)  Alcohol Screen: Low Risk  (07/17/2020)  Depression (PHQ2-9): Low Risk  (07/17/2020)  Financial Resource Strain: Low Risk  (07/17/2020)  Physical Activity: Inactive (07/17/2020)  Social Connections: Unknown (05/20/2023)   Received from [facility]  Stress: No Stress Concern Present (07/17/2020)  Tobacco Use: Low Risk  (08/16/2023)    Readmission Risk Interventions    05/05/2023    3:00 PM  Readmission Risk Prevention Plan  Post Dischage Appt Complete  Medication Screening Complete  Transportation Screening Complete

## 2023-08-18 NOTE — Progress Notes (Addendum)
PROGRESS NOTE    Kaitlin Baker  LKG:[id number] DOB: [birth year] DOA: 08/16/2023 PCP: Patient, No Pcp Per  Subjective: Pt seen and examined. She has large blister on dorsum of left foot. Has been seen by wound care. Pt wants to go home and not to SNF. Pt seen by palliative care. Remains on IV rocephin/vanco.   Hospital Course: HPI: Kaitlin Baker is a 67 y.o. 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  Pain has become significant over the last 24 to 48 hours and she has been unable to ambulate due to the same.  Bilateral lower extremity redness have progressively worsened with increasing swelling.  She denies fever, cough, chest pain, shortness of breath, abdominal pain, diarrhea, dysuria, loss of consciousness, seizures, syncope.  She also had a blister on the left foot which has ruptured.   ED Course: She was found to have significant bilateral lower extremity cellulitis.  WBC of 7.5.  Chest x-ray showed no acute cardiopulmonary disease.  She was started on IV antibiotics. Hospitalist service was called to evaluate the patient.  Significant Events: Admitted 08/16/2023 for bilateral LE cellulitis   Significant Labs: Admission WBC 7.5, HgB 8.7, BUN 16, Scr 0.82  Significant Imaging Studies: Admission CXR  No acute cardiopulmonary disease. 2. 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  Antibiotic Therapy: Anti-infectives (From admission, onward)    Start     Dose/Rate Route Frequency Ordered Stop   08/17/23 1600  vancomycin (VANCOCIN) IVPB 1000 mg/200 mL premix        1,000  mg 200 mL/hr over 60 Minutes Intravenous Every 24 hours 08/16/23 1759     08/16/23 1730  cefTRIAXone (ROCEPHIN) 2 g in sodium chloride 0.9 % 100 mL IVPB        2 g 200 mL/hr over 30 Minutes Intravenous Every 24 hours 08/16/23 1607     08/16/23 1315  Vancomycin (VANCOCIN) 1,500 mg in sodium chloride 0.9 % 500 mL IVPB        1,500 mg 250 mL/hr over 120 Minutes Intravenous  Once 08/16/23 1300 08/16/23 1925       Procedures:   Consultants: Palliative care    Assessment and Plan: * Bilateral cellulitis of lower leg 08-18-2023 will stop IV vanco. Continue with rocephin. Continue with diuresis with IV bumex due to low serum albumin level.  DNR (do not resuscitate)/DNI(Do Not Intubate) 08-18-2023 palliative care has verified pt's DNR/DNI status.  Obesity (BMI 30-39.9) 08-18-2023 BMI 33.3  Primary breast cancer with metastasis to other site [facility]) 08-18-2023 pt seen by palliative care. Pt wants to go home. Has support at home. Already enrolled in home health.  Edema due to hypoalbuminemia 08-18-2023 edema in her legs may be due to low albumin level of 1.8.    DVT prophylaxis:  apixaban (ELIQUIS) tablet 5 mg     Code Status: Limited: Do not attempt resuscitation (DNR) -DNR-LIMITED -Do Not Intubate/DNI  Family Communication: no family at bedside. Pt is decisional. Disposition Plan: return home Reason for continuing need for hospitalization: remains on IV abx.  Objective: Vitals:   08/18/23 0414 08/18/23 0500 08/18/23 0802 08/18/23 1604  BP: 112/88  114/60 129/79  Pulse: 95  (!)  103 (!) 101  Resp: 18     Temp: 98.2 F (36.8 C)     TempSrc: Oral     SpO2: 97%  96% 93%  Weight:  88 kg    Height:        Intake/Output Summary (Last 24 hours) at 08/18/2023 1712 Last data filed at 08/18/2023 0610 Gross per 24 hour  Intake 540.06 ml  Output --  Net 540.06 ml   Filed Weights   08/16/23 1700 08/17/23 0332 08/18/23 0500  Weight: 90 kg 90.9 kg 88 kg    Examination:  Physical  Exam Vitals and nursing note reviewed.  Constitutional:      Appearance: She is obese.  HENT:     Head: Normocephalic and atraumatic.     Nose: Nose normal.  Eyes:     General: No scleral icterus. Cardiovascular:     Rate and Rhythm: Normal rate and regular rhythm.  Pulmonary:     Effort: Pulmonary effort is normal.     Breath sounds: Normal breath sounds.  Abdominal:     General: Bowel sounds are normal.  Skin:    Comments: Large blister on dorsum of left foot that has already ruptured Smaller blister on dorsum of right foot that is intacted  Neurological:     General: No focal deficit present.     Mental Status: She is alert and oriented to person, place, and time.     Data Reviewed: I have personally reviewed following labs and imaging studies  CBC: Recent Labs  Lab 08/16/23 1451 08/17/23 0554 08/18/23 1205  WBC 7.5 7.5 12.1*  NEUTROABS 5.0  --  9.3*  HGB 8.7* 8.1* 9.6*  HCT 28.2* 26.4* 29.6*  MCV 96.2 95.7 91.6  PLT 242 254 341   Basic Metabolic Panel: Recent Labs  Lab 08/16/23 1451 08/17/23 0554 08/18/23 1205  NA 139 138 137  K 3.4* 3.2* 4.2  CL 104 100 101  CO2 27 27 26   GLUCOSE 93 98 148*  BUN 16 14 13   CREATININE 0.82 0.78 0.69  CALCIUM 8.2* 7.7* 8.1*  MG  --  1.3* 1.9   GFR: Estimated Creatinine Clearance: 71.2 mL/min (by C-G formula based on SCr of 0.69 mg/dL). Liver Function Tests: Recent Labs  Lab 08/16/23 1451 08/17/23 0554  AST 22 19  ALT 15 14  ALKPHOS 300* 255*  BILITOT 0.4 0.4  PROT 4.9* 4.6*  ALBUMIN 2.0* 1.8*   Coagulation Profile: Recent Labs  Lab 08/16/23 1451  INR 1.5*   Sepsis Labs: Recent Labs  Lab 08/16/23 1545  LATICACIDVEN 1.5    Recent Results (from the past 240 hour(s))  Culture, blood (Routine x 2)     Status: None (Preliminary result)   Collection Time: 08/16/23  3:30 PM   Specimen: BLOOD LEFT HAND  Result Value Ref Range Status   Specimen Description BLOOD LEFT HAND  Final   Special Requests    Final    BOTTLES DRAWN AEROBIC AND ANAEROBIC Blood Culture results may not be optimal due to an inadequate volume of blood received in culture bottles   Culture   Final    NO GROWTH 2 DAYS Performed at [facility], [address] [place], [place] [address]    Report Status PENDING  Incomplete  Culture, blood (Routine x 2)     Status: None (Preliminary result)   Collection Time: 08/16/23  3:40 PM   Specimen: BLOOD  Result Value Ref Range Status   Specimen Description  BLOOD LEFT ANTECUBITAL  Final   Special Requests   Final    BOTTLES DRAWN AEROBIC AND ANAEROBIC Blood Culture adequate volume   Culture   Final    NO GROWTH 2 DAYS Performed at [facility], [address] [place], [place] [address]    Report Status PENDING  Incomplete     Radiology Studies: ECHOCARDIOGRAM COMPLETE  Result Date: 08/17/2023    ECHOCARDIOGRAM REPORT   Patient Name:   Kaitlin Baker [facility] Date of Exam: 08/17/2023 Medical Rec #:  [id number]      Height:       64.0 in Accession #:    [id number]     Weight:       200.4 lb Date of Birth:  02-Jul-1956      BSA:          1.958 m Patient Age:    67 years       BP:           104/58 mmHg Patient Gender: F              HR:           110 bpm. Exam Location:  Inpatient Procedure: 2D Echo, Cardiac Doppler, Color Doppler and Intracardiac            Opacification Agent Indications:    Dyspnea  History:        Patient has no prior history of Echocardiogram examinations.  Sonographer:    Karma Ganja Referring Phys: [id number] [facility]  Sonographer Comments: Suboptimal apical window. Image acquisition challenging due to patient body habitus. IMPRESSIONS  1. Left ventricular ejection fraction, by estimation, is 70 to 75%. The left ventricle has hyperdynamic function. Left ventricular endocardial border not optimally defined to evaluate regional wall motion. Left ventricular diastolic parameters are consistent with Grade I diastolic dysfunction (impaired relaxation). There is  the interventricular septum is flattened in systole and diastole, consistent with right ventricular pressure and volume overload.  2. Right ventricular systolic function is hyperdynamic. The right ventricular size is severely enlarged. Tricuspid regurgitation signal is inadequate for assessing PA pressure.  3. The mitral valve was not well visualized. No evidence of mitral valve regurgitation. No evidence of mitral stenosis.  4. The aortic valve is tricuspid. Aortic valve regurgitation is not visualized. No aortic stenosis is present.  5. The inferior vena cava is normal in size with greater than 50% respiratory variability, suggesting right atrial pressure of 3 mmHg. Comparison(s): No prior Echocardiogram. FINDINGS  Left Ventricle: Left ventricular ejection fraction, by estimation, is 70 to 75%. The left ventricle has hyperdynamic function. Left ventricular endocardial border not optimally defined to evaluate regional wall motion. Definity contrast agent was given IV to delineate the left ventricular endocardial borders. The left ventricular internal cavity size was normal in size. There is no left ventricular hypertrophy. The interventricular septum is flattened in systole and diastole, consistent with right ventricular pressure and volume overload. Left ventricular diastolic parameters are consistent with Grade I diastolic dysfunction (impaired relaxation). Right Ventricle: The right ventricular size is severely enlarged. No increase in right ventricular wall thickness. Right ventricular systolic function is hyperdynamic. Tricuspid regurgitation signal is inadequate for assessing PA pressure. Left Atrium: Left atrial size was normal in size. Right Atrium: Right atrial size was normal in size. Pericardium: There is no evidence of pericardial effusion. Mitral Valve: The mitral valve was not well visualized. No evidence of mitral valve regurgitation. No evidence of mitral valve stenosis. Tricuspid  Valve: The  tricuspid valve is not well visualized. Tricuspid valve regurgitation is not demonstrated. No evidence of tricuspid stenosis. Aortic Valve: The aortic valve is tricuspid. Aortic valve regurgitation is not visualized. No aortic stenosis is present. Aortic valve mean gradient measures 8.0 mmHg. Aortic valve peak gradient measures 14.0 mmHg. Aortic valve area, by VTI measures 1.90  cm. Pulmonic Valve: The pulmonic valve was not well visualized. Pulmonic valve regurgitation is not visualized. Aorta: The aortic root and ascending aorta are structurally normal, with no evidence of dilitation. Venous: The inferior vena cava is normal in size with greater than 50% respiratory variability, suggesting right atrial pressure of 3 mmHg. IAS/Shunts: The atrial septum is grossly normal.  LEFT VENTRICLE PLAX 2D LVIDd:         4.70 cm   Diastology LVIDs:         3.40 cm   LV e' medial:    6.64 cm/s LV PW:         0.90 cm   LV E/e' medial:  12.9 LV IVS:        0.90 cm   LV e' lateral:   11.00 cm/s LVOT diam:     2.00 cm   LV E/e' lateral: 7.8 LV SV:         49 LV SV Index:   25 LVOT Area:     3.14 cm  IVC IVC diam: 1.30 cm LEFT ATRIUM             Index LA diam:        3.50 cm 1.79 cm/m LA Vol (A2C):   46.3 ml 23.65 ml/m LA Vol (A4C):   50.4 ml 25.74 ml/m LA Biplane Vol: 48.0 ml 24.51 ml/m  AORTIC VALVE AV Area (Vmax):    1.86 cm AV Area (Vmean):   1.80 cm AV Area (VTI):     1.90 cm AV Vmax:           187.00 cm/s AV Vmean:          126.000 cm/s AV VTI:            0.256 m AV Peak Grad:      14.0 mmHg AV Mean Grad:      8.0 mmHg LVOT Vmax:         111.00 cm/s LVOT Vmean:        72.000 cm/s LVOT VTI:          0.155 m LVOT/AV VTI ratio: 0.61  AORTA Ao Root diam: 2.80 cm Ao Asc diam:  2.40 cm MITRAL VALVE               TRICUSPID VALVE MV Area (PHT): 4.86 cm    TR Peak grad:   30.5 mmHg MV Decel Time: 156 msec    TR Vmax:        276.00 cm/s MV E velocity: 85.70 cm/s MV A velocity: 97.70 cm/s  SHUNTS MV E/A ratio:  0.88         Systemic VTI:  0.16 m                            Systemic Diam: 2.00 cm Riley Lam MD Electronically signed by Riley Lam MD Signature Date/Time: 08/17/2023/10:37:31 AM    Final     Scheduled Meds:  apixaban  5 mg Oral BID   bumetanide (BUMEX) IV  1 mg Intravenous Once   dexamethasone  4 mg Oral Daily  leptospermum manuka honey  1 Application Topical Daily   lidocaine  1 patch Transdermal Q24H   morphine  60 mg Oral Q12H   multivitamin with minerals  1 tablet Oral Daily   senna-docusate  1 tablet Oral BID   Continuous Infusions:  cefTRIAXone (ROCEPHIN)  IV Stopped (08/17/23 1833)     LOS: 2 days   Time spent: 40 minutes  Carollee Herter, DO  [facility]  08/18/2023, 5:12 PM

## 2023-08-19 ENCOUNTER — Telehealth: Payer: Self-pay | Admitting: General Practice

## 2023-08-19 ENCOUNTER — Other Ambulatory Visit (HOSPITAL_COMMUNITY): Payer: Self-pay

## 2023-08-19 DIAGNOSIS — L03116 Cellulitis of left lower limb: Secondary | ICD-10-CM | POA: Diagnosis not present

## 2023-08-19 DIAGNOSIS — Z66 Do not resuscitate: Secondary | ICD-10-CM | POA: Diagnosis not present

## 2023-08-19 DIAGNOSIS — E66811 Obesity, class 1: Secondary | ICD-10-CM | POA: Diagnosis not present

## 2023-08-19 DIAGNOSIS — E8809 Other disorders of plasma-protein metabolism, not elsewhere classified: Secondary | ICD-10-CM

## 2023-08-19 DIAGNOSIS — C50919 Malignant neoplasm of unspecified site of unspecified female breast: Secondary | ICD-10-CM | POA: Diagnosis not present

## 2023-08-19 LAB — CBC WITH DIFFERENTIAL/PLATELET
Abs Immature Granulocytes: 0.4 10*3/uL — ABNORMAL HIGH (ref 0.00–0.07)
Basophils Absolute: 0 10*3/uL (ref 0.0–0.1)
Basophils Relative: 0 %
Eosinophils Absolute: 0 10*3/uL (ref 0.0–0.5)
Eosinophils Relative: 0 %
HCT: 29.7 % — ABNORMAL LOW (ref 36.0–46.0)
Hemoglobin: 9.2 g/dL — ABNORMAL LOW (ref 12.0–15.0)
Lymphocytes Relative: 16 %
Lymphs Abs: 2 10*3/uL (ref 0.7–4.0)
MCH: 29.1 pg (ref 26.0–34.0)
MCHC: 31 g/dL (ref 30.0–36.0)
MCV: 94 fL (ref 80.0–100.0)
Monocytes Absolute: 0.4 10*3/uL (ref 0.1–1.0)
Monocytes Relative: 3 %
Myelocytes: 3 %
Neutro Abs: 9.6 10*3/uL — ABNORMAL HIGH (ref 1.7–7.7)
Neutrophils Relative %: 78 %
Platelets: 412 10*3/uL — ABNORMAL HIGH (ref 150–400)
RBC: 3.16 MIL/uL — ABNORMAL LOW (ref 3.87–5.11)
RDW: 16.6 % — ABNORMAL HIGH (ref 11.5–15.5)
WBC: 12.3 10*3/uL — ABNORMAL HIGH (ref 4.0–10.5)
nRBC: 1 % — ABNORMAL HIGH (ref 0.0–0.2)
nRBC: 1 /100{WBCs} — ABNORMAL HIGH

## 2023-08-19 LAB — COMPREHENSIVE METABOLIC PANEL
ALT: 13 U/L (ref 0–44)
AST: 19 U/L (ref 15–41)
Albumin: 1.9 g/dL — ABNORMAL LOW (ref 3.5–5.0)
Alkaline Phosphatase: 289 U/L — ABNORMAL HIGH (ref 38–126)
Anion gap: 9 (ref 5–15)
BUN: 15 mg/dL (ref 8–23)
CO2: 29 mmol/L (ref 22–32)
Calcium: 7.9 mg/dL — ABNORMAL LOW (ref 8.9–10.3)
Chloride: 99 mmol/L (ref 98–111)
Creatinine, Ser: 0.74 mg/dL (ref 0.44–1.00)
GFR, Estimated: >60 mL/min (ref >60)
Glucose, Bld: 129 mg/dL — ABNORMAL HIGH (ref 70–99)
Potassium: 3.6 mmol/L (ref 3.5–5.1)
Sodium: 137 mmol/L (ref 135–145)
Total Bilirubin: 0.3 mg/dL (ref <1.2)
Total Protein: 5.2 g/dL — ABNORMAL LOW (ref 6.5–8.1)

## 2023-08-19 LAB — PROCALCITONIN: Procalcitonin: <0.1 ng/mL

## 2023-08-19 MED ORDER — MEDIHONEY WOUND/BURN DRESSING EX PSTE
1.0000 | PASTE | Freq: Every day | CUTANEOUS | 0 refills | Status: AC
  Filled 2023-08-19: qty 220, 220d supply, fill #0

## 2023-08-19 MED ORDER — TORSEMIDE 20 MG PO TABS
20.0000 mg | ORAL_TABLET | ORAL | 0 refills | Status: DC
  Filled 2023-08-19: qty 14, 28d supply, fill #0

## 2023-08-19 MED ORDER — CEFADROXIL 500 MG PO CAPS
500.0000 mg | ORAL_CAPSULE | Freq: Two times a day (BID) | ORAL | 0 refills | Status: AC
  Filled 2023-08-19: qty 10, 5d supply, fill #0

## 2023-08-19 MED ORDER — POTASSIUM CHLORIDE CRYS ER 20 MEQ PO TBCR
20.0000 meq | EXTENDED_RELEASE_TABLET | Freq: Every day | ORAL | 0 refills | Status: AC
  Filled 2023-08-19: qty 30, 30d supply, fill #0

## 2023-08-19 MED ORDER — DEXAMETHASONE 4 MG PO TABS: 4.0000 mg | ORAL_TABLET | Freq: Every day | ORAL | Status: DC

## 2023-08-19 NOTE — Progress Notes (Signed)
PROGRESS NOTE    Kaitlin Baker  ZOX:[id number] DOB: December 12, 1955 DOA: 08/16/2023 PCP: Patient, No Pcp Per  Subjective: Pt seen and examined. Pt states she is ready to go home. No fevers.   Hospital Course: HPI: Kaitlin Baker is a 67 y.o. 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  Pain has become significant over the last 24 to 48 hours and she has been unable to ambulate due to the same.  Bilateral lower extremity redness have progressively worsened with increasing swelling.  She denies fever, cough, chest pain, shortness of breath, abdominal pain, diarrhea, dysuria, loss of consciousness, seizures, syncope.  She also had a blister on the left foot which has ruptured.   ED Course: She was found to have significant bilateral lower extremity cellulitis.  WBC of 7.5.  Chest x-ray showed no acute cardiopulmonary disease.  She was started on IV antibiotics. Hospitalist service was called to evaluate the patient.  Significant Events: Admitted 08/16/2023 for bilateral LE cellulitis   Significant Labs: Admission WBC 7.5, HgB 8.7, BUN 16, Scr 0.82  Significant Imaging Studies: Admission CXR  No acute cardiopulmonary disease. 2. 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  Antibiotic Therapy: Anti-infectives (From admission, onward)    Start     Dose/Rate Route Frequency Ordered Stop   08/17/23 1600  vancomycin (VANCOCIN) IVPB 1000 mg/200 mL premix        1,000 mg 200 mL/hr over 60 Minutes Intravenous Every 24 hours 08/16/23 1759     08/16/23 1730  cefTRIAXone (ROCEPHIN) 2 g in  sodium chloride 0.9 % 100 mL IVPB        2 g 200 mL/hr over 30 Minutes Intravenous Every 24 hours 08/16/23 1607     08/16/23 1315  Vancomycin (VANCOCIN) 1,500 mg in sodium chloride 0.9 % 500 mL IVPB        1,500 mg 250 mL/hr over 120 Minutes Intravenous  Once 08/16/23 1300 08/16/23 1925       Procedures:   Consultants: Palliative care    Assessment and Plan: * Bilateral cellulitis of lower leg 08-18-2023 will stop IV vanco. Continue with rocephin. Continue with diuresis with IV bumex due to low serum albumin level.  08-19-2023 continue with po duricef at home for another 5 days. Will need diuresis with every other day demadex 20 mg.  Needs f/u with PCP in 1 week to monitor left foot wound and repeat BMP/Mg while on diuretics  DNR (do not resuscitate)/DNI(Do Not Intubate) 08-18-2023 palliative care has verified pt's DNR/DNI status.  Obesity, Class I, BMI 30-34.9 08-18-2023 BMI 33.3  Primary breast cancer with metastasis to other site [facility]) 08-18-2023 pt seen by palliative care. Pt wants to go home. Has support at home. Already enrolled in home health.  Edema due to hypoalbuminemia 08-18-2023 edema in her legs may be due to low albumin level of 1.8.   08-19-2023 will DC to home with every other day demadex 20 mg. Will need repeat BMP/Mg level in office in 1 week. Demadex chosen over lasix since lasix needs more albumin to be effective. With pt's serum albumin level low at 1.8, she would do better on demadex.   DVT prophylaxis:  apixaban (ELIQUIS) tablet 5 mg     Code Status: Limited: Do not attempt resuscitation (DNR) -DNR-LIMITED -Do Not Intubate/DNI  Family Communication: no family at bedside Disposition Plan: return home Reason for continuing need for hospitalization: medically stable for DC.  Objective: Vitals:   08/18/23 2100 08/19/23 0500 08/19/23 0509 08/19/23 0726  BP: 121/62  109/71 116/70  Pulse: (!) 102  97 (!) 107  Resp: 18  18 16   Temp: 98.3 F (36.8 C)  98.7  F (37.1 C) 98.3 F (36.8 C)  TempSrc: Oral  Oral Oral  SpO2: 98%  95% 99%  Weight:  88.2 kg    Height:        Intake/Output Summary (Last 24 hours) at 08/19/2023 1134 Last data filed at 08/19/2023 0843 Gross per 24 hour  Intake 240 ml  Output 400 ml  Net -160 ml   Filed Weights   08/17/23 0332 08/18/23 0500 08/19/23 0500  Weight: 90.9 kg 88 kg 88.2 kg    Examination:  Physical Exam Vitals and nursing note reviewed.  Constitutional:      Appearance: She is obese.  HENT:     Head: Normocephalic and atraumatic.     Nose: Nose normal.  Cardiovascular:     Rate and Rhythm: Normal rate and regular rhythm.  Pulmonary:     Effort: Pulmonary effort is normal.     Breath sounds: Normal breath sounds.  Skin:    General: Skin is warm and dry.     Comments: Less erythema bilateral lower legs Still with pitting +1 edema both legs but improved  Left foot wrapped in bulky guaze dressing  Neurological:     Mental Status: She is alert and oriented to person, place, and time.     Data Reviewed: I have personally reviewed following labs and imaging studies  CBC: Recent Labs  Lab 08/16/23 1451 08/17/23 0554 08/18/23 1205 08/19/23 0644  WBC 7.5 7.5 12.1* 12.3*  NEUTROABS 5.0  --  9.3* 9.6*  HGB 8.7* 8.1* 9.6* 9.2*  HCT 28.2* 26.4* 29.6* 29.7*  MCV 96.2 95.7 91.6 94.0  PLT 242 254 341 412*   Basic Metabolic Panel: Recent Labs  Lab 08/16/23 1451 08/17/23 0554 08/18/23 1205 08/19/23 0644  NA 139 138 137 137  K 3.4* 3.2* 4.2 3.6  CL 104 100 101 99  CO2 27 27 26 29   GLUCOSE 93 98 148* 129*  BUN 16 14 13 15   CREATININE 0.82 0.78 0.69 0.74  CALCIUM 8.2* 7.7* 8.1* 7.9*  MG  --  1.3* 1.9  --    GFR: Estimated Creatinine Clearance: 71.4 mL/min (by C-G formula based on SCr of 0.74 mg/dL). Liver Function Tests: Recent Labs  Lab 08/16/23 1451 08/17/23 0554 08/19/23 0644  AST 22 19 19   ALT 15 14 13   ALKPHOS 300* 255* 289*  BILITOT 0.4 0.4 0.3  PROT 4.9* 4.6* 5.2*   ALBUMIN 2.0* 1.8* 1.9*   Coagulation Profile: Recent Labs  Lab 08/16/23 1451  INR 1.5*   Sepsis Labs: Recent Labs  Lab 08/16/23 1545 08/19/23 0644  PROCALCITON  --  <0.10  LATICACIDVEN 1.5  --     Recent Results (from the past 240 hour(s))  Culture, blood (Routine x 2)     Status: None (Preliminary result)   Collection Time: 08/16/23  3:30 PM   Specimen: BLOOD LEFT HAND  Result Value Ref Range Status   Specimen Description BLOOD LEFT HAND  Final   Special Requests   Final  BOTTLES DRAWN AEROBIC AND ANAEROBIC Blood Culture results may not be optimal due to an inadequate volume of blood received in culture bottles   Culture   Final    NO GROWTH 3 DAYS Performed at [facility], [address] [place], [place] [address]    Report Status PENDING  Incomplete  Culture, blood (Routine x 2)     Status: None (Preliminary result)   Collection Time: 08/16/23  3:40 PM   Specimen: BLOOD  Result Value Ref Range Status   Specimen Description BLOOD LEFT ANTECUBITAL  Final   Special Requests   Final    BOTTLES DRAWN AEROBIC AND ANAEROBIC Blood Culture adequate volume   Culture   Final    NO GROWTH 3 DAYS Performed at [facility], [address], [place], [place] [address]    Report Status PENDING  Incomplete     Radiology Studies: No results found.  Scheduled Meds:  apixaban  5 mg Oral BID   dexamethasone  4 mg Oral Daily   leptospermum manuka honey  1 Application Topical Daily   lidocaine  1 patch Transdermal Q24H   morphine  60 mg Oral Q12H   multivitamin with minerals  1 tablet Oral Daily   senna-docusate  1 tablet Oral BID   Continuous Infusions:  cefTRIAXone (ROCEPHIN)  IV 2 g (08/18/23 1818)     LOS: 3 days   Time spent: 40 minutes  Carollee Herter, DO  [facility]  08/19/2023, 11:34 AM

## 2023-08-19 NOTE — Discharge Summary (Signed)
[facility] Phys[facility] Discharge Summary   Patient name: Kaitlin Baker  Admit date:     08/16/2023  Discharge date: 08/19/2023  Attending Physician: Glade Lloyd [[id number]]  Discharge Physician: Carollee Herter   PCP: Patient, No Pcp Per  Admitted From: Home  Disposition:  Home  Recommendations for Outpatient Follow-up:  Follow up with PCP in 1-2 weeks Will need repeat BMP/Mg level in office for monitor her diuresis  Home Health:Yes. Home health PT, RN, OT, aide,  Equipment/Devices: None    Discharge Condition:Stable CODE STATUS:DNR/DNI Diet recommendation: Heart Healthy Fluid Restriction: None  Hospital Summary: HPI: Kaitlin Baker is a 67 y.o. 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  Pain has become significant over the last 24 to 48 hours and she has been unable to ambulate due to the same.  Bilateral lower extremity redness have progressively worsened with increasing swelling.  She denies fever, cough, chest pain, shortness of breath, abdominal pain, diarrhea, dysuria, loss of consciousness, seizures, syncope.  She also had a blister on the left foot which has ruptured.   ED Course: She was found to have significant bilateral lower extremity cellulitis.  WBC of 7.5.  Chest x-ray showed no acute cardiopulmonary disease.  She was started on IV antibiotics. Hospitalist service was called to evaluate the patient.  Significant Events: Admitted 08/16/2023 for bilateral LE cellulitis   Significant Labs: Admission WBC 7.5, HgB 8.7, BUN 16, Scr 0.82  Significant Imaging  Studies: Admission CXR  No acute cardiopulmonary disease. 2. 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  Antibiotic Therapy: Anti-infectives (From admission, onward)    Start     Dose/Rate Route Frequency Ordered Stop   08/17/23 1600  vancomycin (VANCOCIN) IVPB 1000 mg/200 mL premix        1,000 mg 200 mL/hr over 60 Minutes Intravenous Every 24 hours 08/16/23 1759     08/16/23 1730  cefTRIAXone (ROCEPHIN) 2 g in sodium chloride 0.9 % 100 mL IVPB        2 g 200 mL/hr over 30 Minutes Intravenous Every 24 hours 08/16/23 1607     08/16/23 1315  Vancomycin (VANCOCIN) 1,500 mg in sodium chloride 0.9 % 500 mL IVPB        1,500 mg 250 mL/hr over 120 Minutes Intravenous  Once 08/16/23 1300 08/16/23 1925       Procedures:   Consultants: Palliative care   Hospital Course by Problem: * Bilateral cellulitis of lower leg 08-18-2023 will stop IV vanco. Continue with rocephin. Continue with diuresis with IV bumex due to low serum albumin level.  08-19-2023 continue with po duricef at home for another 5 days. Will need diuresis with every other day demadex 20 mg.  Needs f/u with PCP in 1 week to monitor left foot wound and repeat BMP/Mg while on diuretics  DNR (do not resuscitate)/DNI(Do Not Intubate) 08-18-2023 palliative care has verified pt's DNR/DNI status.  Obesity, Class I, BMI 30-34.9 08-18-2023 BMI 33.3  Primary breast cancer with metastasis to other site [facility]) 08-18-2023 pt seen by palliative care. Pt wants to go home. Has support at home. Already enrolled in home health.  Edema due to hypoalbuminemia 08-18-2023 edema in her legs  may be due to low albumin level of 1.8.   08-19-2023 will DC to home with every other day demadex 20 mg. Will need repeat BMP/Mg level in office in 1 week. Demadex chosen over lasix since lasix needs more albumin to be effective. With pt's serum albumin level low at 1.8, she would do better on demadex.    Discharge Diagnoses:  Principal  Problem:   Bilateral cellulitis of lower leg Active Problems:   Primary breast cancer with metastasis to other site (HCC)   Obesity, Class I, BMI 30-34.9   DNR (do not resuscitate)/DNI(Do Not Intubate)   Edema due to hypoalbuminemia   Discharge Instructions  Discharge Instructions     Call MD for:  difficulty breathing, headache or visual disturbances   Complete by: As directed    Call MD for:  extreme fatigue   Complete by: As directed    Call MD for:  persistant dizziness or light-headedness   Complete by: As directed    Call MD for:  persistant nausea and vomiting   Complete by: As directed    Call MD for:  temperature >100.4   Complete by: As directed    Change dressing (specify)   Complete by: As directed    Dressing change: 1 times per day every day or if soiled/saturated.   Diet - low sodium heart healthy   Complete by: As directed    Discharge instructions   Complete by: As directed    1. Follow up with your primary care provider in 1 week following discharge from hospital to monitor your left foot wound.   Discharge wound care:   Complete by: As directed    Clean with warm water and anti-bacterial liquid soap. Pat dry. Do NOT rub dry. apply Medihoney on the wound bed, change daily. Cover with foam dressing, change every 3 days or if is saturated. Apply Kerlix gauze on the leg, beginning in the end of the toe, until the middle leg, not too tight.  Lymphedema  Resources (updated 07/2021) Each site requires a referral from your primary care MD [facility] [address] [place], [place]  [phone] (Upper extremities)   [address] [place], [place] [phone] (Lower extremities, PATIENT CAN NOT HAVE A WOUND)   [facility] [address] [place], [place] [address] [phone]   [facility] [address] Medical Office Building [address]  [place],  [place] [phone]  [facility] [address] [place], [place] [address] [phone]   [facility]  (only treatment for lymphedema related to cancer diagnosis) [address]  [place], [place] [address] [phone]     [facility] [address] [place], [place] [address] [phone] [facility] (formerly [facility]) [phone] [place], [place] [address] [phone]   Increase activity slowly   Complete by: As directed       Allergies as of 08/19/2023       Reactions   Codeine Nausea And Vomiting        Medication List     TAKE these medications    acetaminophen 500 MG tablet Commonly known as: TYLENOL Take 2 tablets (1,000 mg total) by mouth 3 (three) times daily.   apixaban 5 MG Tabs tablet Commonly known as: ELIQUIS Take 5 mg by mouth 2 (two) times daily.  cefadroxil 500 MG capsule Commonly known as: DURICEF Take 1 capsule (500 mg total) by mouth 2 (two) times daily for 5 days.   cyclobenzaprine 10 MG tablet Commonly known as: FLEXERIL Take 10 mg by mouth every 8 (eight) hours as needed for muscle spasms.   dexamethasone 4 MG tablet Commonly known as: DECADRON Take 1 tablet (4 mg total) by mouth daily. What changed: how much to take   leptospermum manuka honey Pste paste Apply 1 Application topically daily. Start taking on: August 20, 2023   morphine 60 MG 12 hr tablet Commonly known as: MS CONTIN Take 60 mg by mouth every 12 (twelve) hours.   Orserdu 345 MG tablet Generic drug: elacestrant hydrochloride Take 345 mg by mouth daily.   Oxycodone HCl 10 MG Tabs Take 10 mg by mouth every 6 (six) hours as needed.   pantoprazole 40 MG tablet Commonly known as: PROTONIX Take 40 mg by mouth daily.   potassium chloride SA 20 MEQ tablet Commonly known as: KLOR-CON M Take 1 tablet (20  mEq total) by mouth daily.   prochlorperazine 10 MG tablet Commonly known as: COMPAZINE Take 1 tablet (10 mg total) by mouth every 6 (six) hours as needed for nausea or vomiting.   torsemide 20 MG tablet Commonly known as: DEMADEX Take 1 tablet (20 mg total) by mouth every other day.               Discharge Care Instructions  (From admission, onward)           Start     Ordered   08/19/23 0000  Discharge wound care:       Comments: Clean with warm water and anti-bacterial liquid soap. Pat dry. Do NOT rub dry. apply Medihoney on the wound bed, change daily. Cover with foam dressing, change every 3 days or if is saturated. Apply Kerlix gauze on the leg, beginning in the end of the toe, until the middle leg, not too tight.  Lymphedema  Resources (updated 07/2021) Each site requires a referral from your primary care MD [facility] [address] [place], [place]  [phone] (Upper extremities)   [address] [place], [place] [phone] (Lower extremities, PATIENT CAN NOT HAVE A WOUND)   [facility] [address] [place], [place] [address] [phone]   [facility] [address] Medical Office [address]  [place], [place] [phone]  [facility] [address] [place], [place] [address] [phone]   [facility]  (only treatment for lymphedema related to cancer diagnosis) [address]  [place], [place] [address] [phone]     [facility] [address] [place], [place] [address] [phone] [facility] ([facility]) [phone] [address] [place], [place] [address] [phone]   08/19/23 1128   08/19/23 0000  Change dressing (specify)       Comments: Dressing change: 1 times per day  every day or if soiled/saturated.   08/19/23 1128            Follow-up Information     Care, [facility] Follow up.   Specialty: Home Health Services Why: [facility] will continue to provide home health services for PT, RN.  They will call you in the next 24-48 hours to continue to provide services at the home. Contact information: [address] [place] [address] [phone]                Allergies  Allergen Reactions   Codeine Nausea And Vomiting    Discharge Exam: Vitals:   08/19/23 0509 08/19/23 0726  BP: 109/71 116/70  Pulse: 97 (!) 107  Resp: 18 16  Temp: 98.7 F (37.1 C) 98.3 F (36.8 C)  SpO2: 95% 99%    Physical Exam Vitals and nursing note reviewed.  Constitutional:      Appearance: She is obese.  HENT:     Head: Normocephalic and atraumatic.     Nose: Nose normal.  Cardiovascular:     Rate and Rhythm: Normal rate and regular rhythm.  Pulmonary:     Effort: Pulmonary effort is normal.     Breath sounds: Normal breath sounds.  Skin:    General: Skin is warm and dry.     Comments: Less erythema bilateral lower legs Still with pitting +1 edema both legs but improved  Left foot wrapped in bulky guaze dressing  Neurological:     Mental Status: She is alert and oriented to person, place, and time.     The results of significant diagnostics from this hospitalization (including imaging, microbiology, ancillary and laboratory) are listed below for reference.    Microbiology: Recent Results (from the past 240 hour(s))  Culture, blood (Routine x 2)     Status: None (Preliminary result)   Collection Time: 08/16/23  3:30 PM   Specimen: BLOOD LEFT HAND  Result Value Ref Range Status   Specimen Description BLOOD LEFT HAND  Final   Special Requests   Final    BOTTLES DRAWN AEROBIC AND ANAEROBIC Blood Culture results may not be optimal due to an inadequate volume of blood received in culture bottles   Culture   Final    NO  GROWTH 3 DAYS Performed at [facility], [address], [place], [place] [address]    Report Status PENDING  Incomplete  Culture, blood (Routine x 2)     Status: None (Preliminary result)   Collection Time: 08/16/23  3:40 PM   Specimen: BLOOD  Result Value Ref Range Status   Specimen Description BLOOD LEFT ANTECUBITAL  Final   Special Requests   Final    BOTTLES DRAWN AEROBIC AND ANAEROBIC Blood Culture adequate volume   Culture   Final    NO GROWTH 3 DAYS Performed at [facility], [address], [place], [place] [address]    Report Status PENDING  Incomplete     Labs:  Basic Metabolic Panel: Recent Labs  Lab 08/16/23 1451 08/17/23 0554 08/18/23 1205 08/19/23 0644  NA 139 138 137 137  K 3.4* 3.2* 4.2 3.6  CL 104 100 101 99  CO2 27 27 26 29   GLUCOSE 93 98 148* 129*  BUN 16 14 13 15   CREATININE 0.82 0.78 0.69 0.74  CALCIUM 8.2* 7.7* 8.1* 7.9*  MG  --  1.3* 1.9  --    Liver Function Tests: Recent Labs  Lab 08/16/23 1451 08/17/23 0554 08/19/23 0644  AST 22 19 19   ALT 15 14 13   ALKPHOS 300* 255* 289*  BILITOT 0.4 0.4 0.3  PROT 4.9* 4.6* 5.2*  ALBUMIN 2.0* 1.8* 1.9*    CBC: Recent Labs  Lab 08/16/23 1451 08/17/23 0554 08/18/23 1205 08/19/23 0644  WBC 7.5 7.5 12.1* 12.3*  NEUTROABS 5.0  --  9.3* 9.6*  HGB 8.7* 8.1* 9.6* 9.2*  HCT  28.2* 26.4* 29.6* 29.7*  MCV 96.2 95.7 91.6 94.0  PLT 242 254 341 412*   Urinalysis    Component Value Date/Time   COLORURINE STRAW (A) 08/16/2023 1951   APPEARANCEUR CLEAR 08/16/2023 1951   LABSPEC 1.005 08/16/2023 1951   PHURINE 6.0 08/16/2023 1951   GLUCOSEU NEGATIVE 08/16/2023 1951   HGBUR NEGATIVE 08/16/2023 1951   BILIRUBINUR NEGATIVE 08/16/2023 1951   KETONESUR NEGATIVE 08/16/2023 1951   PROTEINUR NEGATIVE 08/16/2023 1951   UROBILINOGEN 0.2 12/03/2010 0737   NITRITE NEGATIVE 08/16/2023 1951   LEUKOCYTESUR NEGATIVE 08/16/2023 1951   Sepsis Labs Recent Labs  Lab 08/16/23 1451 08/17/23 0554  08/18/23 1205 08/19/23 0644  WBC 7.5 7.5 12.1* 12.3*   Microbiology Recent Results (from the past 240 hour(s))  Culture, blood (Routine x 2)     Status: None (Preliminary result)   Collection Time: 08/16/23  3:30 PM   Specimen: BLOOD LEFT HAND  Result Value Ref Range Status   Specimen Description BLOOD LEFT HAND  Final   Special Requests   Final    BOTTLES DRAWN AEROBIC AND ANAEROBIC Blood Culture results may not be optimal due to an inadequate volume of blood received in culture bottles   Culture   Final    NO GROWTH 3 DAYS Performed at [facility], [address] [place], [place] [address]    Report Status PENDING  Incomplete  Culture, blood (Routine x 2)     Status: None (Preliminary result)   Collection Time: 08/16/23  3:40 PM   Specimen: BLOOD  Result Value Ref Range Status   Specimen Description BLOOD LEFT ANTECUBITAL  Final   Special Requests   Final    BOTTLES DRAWN AEROBIC AND ANAEROBIC Blood Culture adequate volume   Culture   Final    NO GROWTH 3 DAYS Performed at [facility], [address] [place], [place] [address]    Report Status PENDING  Incomplete    Procedures/Studies: ECHOCARDIOGRAM COMPLETE  Result Date: 08/17/2023    ECHOCARDIOGRAM REPORT   Patient Name:   KYMANI FOULKES [facility] Date of Exam: 08/17/2023 Medical Rec #:  [id number]      Height:       64.0 in Accession #:    [id number]     Weight:       200.4 lb Date of Birth:  02-16-1956      BSA:          1.958 m Patient Age:    67 years       BP:           104/58 mmHg Patient Gender: F              HR:           110 bpm. Exam Location:  Inpatient Procedure: 2D Echo, Cardiac Doppler, Color Doppler and Intracardiac            Opacification Agent Indications:    Dyspnea  History:        Patient has no prior history of Echocardiogram examinations.  Sonographer:    Karma Ganja Referring Phys: [id number] [facility]  Sonographer Comments: Suboptimal apical window. Image acquisition challenging due to patient  body habitus. IMPRESSIONS  1. Left ventricular ejection fraction, by estimation, is 70 to 75%. The left ventricle has hyperdynamic function. Left ventricular endocardial border not optimally defined to evaluate regional wall motion. Left ventricular diastolic parameters are consistent with Grade I diastolic dysfunction (impaired relaxation). There is the  interventricular septum is flattened in systole and diastole, consistent with right ventricular pressure and volume overload.  2. Right ventricular systolic function is hyperdynamic. The right ventricular size is severely enlarged. Tricuspid regurgitation signal is inadequate for assessing PA pressure.  3. The mitral valve was not well visualized. No evidence of mitral valve regurgitation. No evidence of mitral stenosis.  4. The aortic valve is tricuspid. Aortic valve regurgitation is not visualized. No aortic stenosis is present.  5. The inferior vena cava is normal in size with greater than 50% respiratory variability, suggesting right atrial pressure of 3 mmHg. Comparison(s): No prior Echocardiogram. FINDINGS  Left Ventricle: Left ventricular ejection fraction, by estimation, is 70 to 75%. The left ventricle has hyperdynamic function. Left ventricular endocardial border not optimally defined to evaluate regional wall motion. Definity contrast agent was given IV to delineate the left ventricular endocardial borders. The left ventricular internal cavity size was normal in size. There is no left ventricular hypertrophy. The interventricular septum is flattened in systole and diastole, consistent with right ventricular pressure and volume overload. Left ventricular diastolic parameters are consistent with Grade I diastolic dysfunction (impaired relaxation). Right Ventricle: The right ventricular size is severely enlarged. No increase in right ventricular wall thickness. Right ventricular systolic function is hyperdynamic. Tricuspid regurgitation signal is inadequate  for assessing PA pressure. Left Atrium: Left atrial size was normal in size. Right Atrium: Right atrial size was normal in size. Pericardium: There is no evidence of pericardial effusion. Mitral Valve: The mitral valve was not well visualized. No evidence of mitral valve regurgitation. No evidence of mitral valve stenosis. Tricuspid Valve: The tricuspid valve is not well visualized. Tricuspid valve regurgitation is not demonstrated. No evidence of tricuspid stenosis. Aortic Valve: The aortic valve is tricuspid. Aortic valve regurgitation is not visualized. No aortic stenosis is present. Aortic valve mean gradient measures 8.0 mmHg. Aortic valve peak gradient measures 14.0 mmHg. Aortic valve area, by VTI measures 1.90  cm. Pulmonic Valve: The pulmonic valve was not well visualized. Pulmonic valve regurgitation is not visualized. Aorta: The aortic root and ascending aorta are structurally normal, with no evidence of dilitation. Venous: The inferior vena cava is normal in size with greater than 50% respiratory variability, suggesting right atrial pressure of 3 mmHg. IAS/Shunts: The atrial septum is grossly normal.  LEFT VENTRICLE PLAX 2D LVIDd:         4.70 cm   Diastology LVIDs:         3.40 cm   LV e' medial:    6.64 cm/s LV PW:         0.90 cm   LV E/e' medial:  12.9 LV IVS:        0.90 cm   LV e' lateral:   11.00 cm/s LVOT diam:     2.00 cm   LV E/e' lateral: 7.8 LV SV:         49 LV SV Index:   25 LVOT Area:     3.14 cm  IVC IVC diam: 1.30 cm LEFT ATRIUM             Index LA diam:        3.50 cm 1.79 cm/m LA Vol (A2C):   46.3 ml 23.65 ml/m LA Vol (A4C):   50.4 ml 25.74 ml/m LA Biplane Vol: 48.0 ml 24.51 ml/m  AORTIC VALVE AV Area (Vmax):    1.86 cm AV Area (Vmean):   1.80 cm AV Area (VTI):     1.90 cm AV  Vmax:           187.00 cm/s AV Vmean:          126.000 cm/s AV VTI:            0.256 m AV Peak Grad:      14.0 mmHg AV Mean Grad:      8.0 mmHg LVOT Vmax:         111.00 cm/s LVOT Vmean:        72.000  cm/s LVOT VTI:          0.155 m LVOT/AV VTI ratio: 0.61  AORTA Ao Root diam: 2.80 cm Ao Asc diam:  2.40 cm MITRAL VALVE               TRICUSPID VALVE MV Area (PHT): 4.86 cm    TR Peak grad:   30.5 mmHg MV Decel Time: 156 msec    TR Vmax:        276.00 cm/s MV E velocity: 85.70 cm/s MV A velocity: 97.70 cm/s  SHUNTS MV E/A ratio:  0.88        Systemic VTI:  0.16 m                            Systemic Diam: 2.00 cm Riley Lam MD Electronically signed by Riley Lam MD Signature Date/Time: 08/17/2023/10:37:31 AM    Final    VAS [place] LOWER EXTREMITY VENOUS (DVT) (7a-7p)  Result Date: 08/16/2023  Lower Venous DVT Study Patient Name:  GEET EHMAN [facility]  Date of Exam:   08/16/2023 Medical Rec #: [id number]       Accession #:    [id number] Date of Birth: 02-Feb-1956       Patient Gender: F Patient Age:   78 years Exam Location:  [facility] Procedure:      VAS [place] LOWER EXTREMITY VENOUS (DVT) Referring Phys: Theron Arista MESSICK --------------------------------------------------------------------------------  Indications: Edema.  Risk Factors: None identified. Limitations: Poor ultrasound/tissue interface, body habitus and patient positioning. Comparison Study: No prior studies. Performing Technologist: Chanda Busing RVT  Examination Guidelines: A complete evaluation includes B-mode imaging, spectral Doppler, color Doppler, and power Doppler as needed of all accessible portions of each vessel. Bilateral testing is considered an integral part of a complete examination. Limited examinations for reoccurring indications may be performed as noted. The reflux portion of the exam is performed with the patient in reverse Trendelenburg.  +---------+---------------+---------+-----------+----------+--------------+ RIGHT    CompressibilityPhasicitySpontaneityPropertiesThrombus Aging +---------+---------------+---------+-----------+----------+--------------+ CFV      Full           Yes      Yes                                  +---------+---------------+---------+-----------+----------+--------------+ SFJ      Full                                                        +---------+---------------+---------+-----------+----------+--------------+ FV Prox  Full                                                        +---------+---------------+---------+-----------+----------+--------------+  FV Mid                  Yes      Yes                                 +---------+---------------+---------+-----------+----------+--------------+ FV Distal               Yes      Yes                                 +---------+---------------+---------+-----------+----------+--------------+ PFV      Full                                                        +---------+---------------+---------+-----------+----------+--------------+ POP      Full           Yes      Yes                                 +---------+---------------+---------+-----------+----------+--------------+ PTV      Full                                                        +---------+---------------+---------+-----------+----------+--------------+ PERO     Full                                                        +---------+---------------+---------+-----------+----------+--------------+   +---------+---------------+---------+-----------+----------+-------------------+ LEFT     CompressibilityPhasicitySpontaneityPropertiesThrombus Aging      +---------+---------------+---------+-----------+----------+-------------------+ CFV      Full           Yes      Yes                                      +---------+---------------+---------+-----------+----------+-------------------+ SFJ      Full                                                             +---------+---------------+---------+-----------+----------+-------------------+ FV Prox  Full                                                              +---------+---------------+---------+-----------+----------+-------------------+ FV Mid                  Yes      Yes                                      +---------+---------------+---------+-----------+----------+-------------------+  FV Distal               Yes      Yes                                      +---------+---------------+---------+-----------+----------+-------------------+ PFV      Full                                                             +---------+---------------+---------+-----------+----------+-------------------+ POP      Full           Yes      Yes                                      +---------+---------------+---------+-----------+----------+-------------------+ PTV      Full                                                             +---------+---------------+---------+-----------+----------+-------------------+ PERO                                                  Not well visualized +---------+---------------+---------+-----------+----------+-------------------+     Summary: RIGHT: - There is no evidence of deep vein thrombosis in the lower extremity. However, portions of this examination were limited- see technologist comments above.  - No cystic structure found in the popliteal fossa.  LEFT: - There is no evidence of deep vein thrombosis in the lower extremity. However, portions of this examination were limited- see technologist comments above.  - No cystic structure found in the popliteal fossa.  *See table(s) above for measurements and observations. Electronically signed by Gerarda Fraction on 08/16/2023 at 4:08:00 PM.    Final    DG Foot Complete Left  Result Date: 08/16/2023 CLINICAL DATA:  Pain, swelling, redness, bilaterally, open wound on left foot EXAM: LEFT FOOT - COMPLETE 3+ VIEW; RIGHT FOOT COMPLETE - 3+ VIEW COMPARISON:  None Available. FINDINGS: Left: Marked diffuse swelling about the left foot. No evidence of  osteomyelitis. No acute fracture or dislocation. Soft tissue ulcer about the dorsum of the foot. Right: Diffuse soft tissue swelling about the right foot. No acute fracture or dislocation. No evidence of osteomyelitis. IMPRESSION: Diffuse soft tissue swelling about the bilateral feet, left greater than right. No evidence of osteomyelitis. Electronically Signed   By: Minerva Fester M.D.   On: 08/16/2023 16:06   DG Foot Complete Right  Result Date: 08/16/2023 CLINICAL DATA:  Pain, swelling, redness, bilaterally, open wound on left foot EXAM: LEFT FOOT - COMPLETE 3+ VIEW; RIGHT FOOT COMPLETE - 3+ VIEW COMPARISON:  None Available. FINDINGS: Left: Marked diffuse swelling about the left foot. No evidence of osteomyelitis. No acute fracture or dislocation. Soft tissue ulcer about the dorsum of the foot. Right: Diffuse soft tissue swelling about  the right foot. No acute fracture or dislocation. No evidence of osteomyelitis. IMPRESSION: Diffuse soft tissue swelling about the bilateral feet, left greater than right. No evidence of osteomyelitis. Electronically Signed   By: Minerva Fester M.D.   On: 08/16/2023 16:06   DG Chest Port 1 View  Result Date: 08/16/2023 CLINICAL DATA:  Shortness of breath.  Bilateral lower leg swelling. EXAM: PORTABLE CHEST 1 VIEW COMPARISON:  CT chest 05/04/2023 and radiographs 12/07/2010 FINDINGS: Stable cardiomediastinal silhouette. Aortic atherosclerotic calcification. Chronic bronchitic changes. Platelike atelectasis in the left lower lung. The lungs are otherwise clear. No pleural effusion or pneumothorax. Postoperative changes right humerus. Cervical spine fusion hardware. Bilateral remote rib fractures. 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 IMPRESSION: 1. No acute cardiopulmonary disease. 2. 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 Electronically Signed   By: Minerva Fester M.D.   On: 08/16/2023 16:03    Time coordinating discharge: 40  mins  SIGNED:  Carollee Herter, DO [facility] 08/19/23, 11:35 AM

## 2023-08-19 NOTE — Telephone Encounter (Signed)
Copied from CRM [id number]. Topic: Appointments - Appointment Scheduling >> Aug 19, 2023 11:53 AM Deaijah H wrote: Patient/patient representative is calling to schedule an appointment. Refer to attachments for appointment information. // Marcelino Duster [place] [facility] std patient needs hospital follow up w/ labs and wound checks on lower legs // advise can put patient on wait lsit but would like to be reached if same/daysooner appointment is available // [phone]

## 2023-08-19 NOTE — TOC Transition Note (Addendum)
Transition of Care [facility]) - CM/SW Discharge Note   Patient Details  Name: Kaitlin Baker MRN: [id number] Date of Birth: [birth year]  Transition of Care [facility]) CM/SW Contact:  Janae Bridgeman, RN Phone Number: 08/19/2023, 11:39 AM   Clinical Narrative:    Cm met with the patient at the bedside prior to discharge and the patient plans to go home today around 2 pm.  Son is providing transportation to home.  [facility] is active with the patient and will follow up in the home for continued services.  I spoke with the patient at the bedside and patient does not have a primary care provider.  [facility] and patient was set up for a hospital follow up - earliest visit is scheduled for 09/13/23 and office will call patient back for sooner appointment.    Patient was advised to see her oncology MD to follow up for blood work in 1 week and wound check since she is waiting to get established with PCP.  Patient's son will pick her up for discharge today at 2 pm.   Final next level of care: Home w Home Health Services Barriers to Discharge: Continued Medical Work up   Patient Goals and CMS Choice      Discharge Placement                         Discharge Plan and Services Additional resources added to the After Visit Summary for   In-house Referral: Clinical Social Work                                   Social Determinants of Health (SDOH) Interventions SDOH Screenings   Food Insecurity: No Food Insecurity (08/16/2023)  Housing: Low Risk  (08/16/2023)  Transportation Needs: No Transportation Needs (08/16/2023)  Utilities: Not At Risk (08/16/2023)  Alcohol Screen: Low Risk  (07/17/2020)  Depression (PHQ2-9): Low Risk  (07/17/2020)  Financial Resource Strain: Low Risk  (07/17/2020)  Physical Activity: Inactive (07/17/2020)  Social Connections: Unknown (05/20/2023)   Received from [facility]  Stress: No Stress Concern Present (07/17/2020)  Tobacco  Use: Low Risk  (08/16/2023)     Readmission Risk Interventions    08/19/2023   11:38 AM 05/05/2023    3:00 PM  Readmission Risk Prevention Plan  Post Dischage Appt  Complete  Medication Screening  Complete  Transportation Screening Complete Complete  Medication Review (RN Futures trader) Complete   PCP or Specialist appointment within 3-5 days of discharge Complete   HRI or Home Care Consult Complete   SW Recovery Care/Counseling Consult Complete   Palliative Care Screening Complete   Skilled Nursing Facility Not Applicable

## 2023-08-19 NOTE — Progress Notes (Signed)
Mobility Specialist Progress Note:    08/19/23 0917  Mobility  Activity Transferred from bed to chair  Level of Assistance Contact guard assist, steadying assist  Assistive Device Front wheel walker  Distance Ambulated (ft) 3 ft  Activity Response Tolerated well  Mobility Referral Yes  $Mobility charge 1 Mobility  Mobility Specialist Start Time (ACUTE ONLY) [id number]  Mobility Specialist Stop Time (ACUTE ONLY) [id number]  Mobility Specialist Time Calculation (min) (ACUTE ONLY) 6 min   Pt received sitting EOB, eager to transfer B>C via RW. MinG for safety. Tolerated well, asx throughout. Chair alarm on, call bell in reach, all needs met.    Feliciana Rossetti Mobility Specialist Please contact via Special educational needs teacher or  Rehab office at [phone]

## 2023-08-19 NOTE — Care Management Important Message (Signed)
Important Message  Patient Details  Name: Kaitlin Baker MRN: [id number] Date of Birth: [birth year]   Important Message Given:  Yes - Medicare IM     Dorena Bodo 08/19/2023, 3:11 PM

## 2023-08-19 NOTE — Plan of Care (Signed)
  Problem: Activity: Goal: Risk for activity intolerance will decrease Outcome: Progressing   Problem: Pain Management: Goal: General experience of comfort will improve Outcome: Progressing   Problem: Safety: Goal: Ability to remain free from injury will improve Outcome: Progressing

## 2023-08-19 NOTE — Telephone Encounter (Signed)
Tried calling pt to get her sooner appointment. No answer, LVM

## 2023-08-19 NOTE — Care Management Important Message (Signed)
Important Message  Patient Details  Name: Kaitlin Baker MRN: [id number] Date of Birth: [birth year]   Important Message Given:  Yes - Medicare IM  CORRECTION  Patient discharged prior to IM delivery will mail a copy to the patient home address.    Nelta Caudill 08/19/2023, 3:44 PM

## 2023-08-21 LAB — CULTURE, BLOOD (ROUTINE X 2)
Culture: NO GROWTH
Culture: NO GROWTH
Special Requests: ADEQUATE

## 2023-08-23 ENCOUNTER — Other Ambulatory Visit: Payer: Self-pay | Admitting: *Deleted

## 2023-08-23 MED ORDER — ORSERDU 345 MG PO TABS: 345.0000 mg | ORAL_TABLET | Freq: Every day | ORAL | 3 refills | Status: DC

## 2023-08-27 ENCOUNTER — Inpatient Hospital Stay: Payer: Self-pay | Admitting: Nurse Practitioner

## 2023-09-01 ENCOUNTER — Inpatient Hospital Stay: Payer: Self-pay | Admitting: Nurse Practitioner

## 2023-09-09 ENCOUNTER — Inpatient Hospital Stay: Payer: Medicare Other | Attending: Hematology | Admitting: Hematology

## 2023-09-09 NOTE — Progress Notes (Incomplete)
[facility] [address] [place] [address]    Clinic Day:  09/09/23   Referring physician: No ref. provider found  Patient Care Team: Patient, No Pcp Per as PCP - General (General Practice) [name], MD as Medical Oncologist (Medical Oncology)   ASSESSMENT & PLAN:   Assessment: 1.  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  She did not seek medical attention.  She did not have mammograms for the past 30 years. -Reported 26 pound weight loss which was intentional in the last 6 months. -Developed fracture of the proximal left humeral diaphysis when she picked up her cat. -CT humerus left without contrast on 05/23/2020 shows minimally displaced oblique fracture of the proximal left humeral diaphysis with no definitive CT evidence of underlying lesion.  Nonspecific left axillary adenopathy. -Physical exam today reveals left breast mass occupying the majority of the outer quadrant with skin thickening.  Could not palpate the axillary lymph node as she was unable to lift her arm. -PET scan on 06/24/2020 showed irregular hypermetabolic 8.1 x 4.0 left breast mass compatible with malignancy with left axillary nodal metastasis.  Extensive multifocal mixed lytic and sclerotic metastatic disease throughout the axial and proximal appendicular skeleton. -Ultrasound-guided left breast biopsy on 07/11/2020 shows invasive mammary carcinoma, grade 2.  ER/PR 90% positive.  Ki-67 60%.  HER-2 2+ by IHC.  HER-2 negative by FISH. -Anastrozole and palbociclib from 07/19/2020 through 02/15/2023 with progression - Germline mutation testing was negative. - Bone scan: Single new focus of uptake in the mid thoracic spine at T8. - PET scan (01/21/2023): Diffuse lytic/sclerotic bone mets-right glenoid, left sixth rib, T9 vertebral body, L1 vertebral body, left established lesion, left proximal femur lesion and  right humerus.  No visceral lesion. - Guardant360 (01/18/2023): ESR1 Y537N and ESR1 Y537S.  BRCA2 12150fs.  MSI high not detected. -Elacestrant 345 mg daily started around 02/26/2023.   2.  Social/family history: -Lives at home with husband.  Never smoker.  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    Plan: 1.  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 - She is tolerating it reasonably well. - She was evaluated in the urgent care as she had left shoulder blade pain radiating to the left breast last Wednesday.  Pain has improved over the last few days. - Reviewed labs today: Alk phos elevated at 133.  Rest of LFTs and creatinine normal.  CBC grossly normal.  Last CEA 15-3 was 23.1. - Continue elacestrant daily.  RTC 8 weeks for follow-up.  Will plan on repeating PET scan and tumor marker prior to next visit.   2.  Bone metastasis: - Not a candidate for denosumab due to poor dentition.   3.  Chronic back pain/worsening right arm pain: - She reports worsening pains after the right humerus nailing. - Currently taking Percocet 5 mg every 8 hours which is not helping. - Will increase Percocet to every 6 hours as needed.  Recommend follow-up with orthopedics.   4.  Low vitamin D levels: - Continue vitamin D weekly.  Last vitamin D is 42.   5.  Systemic thyroid nodule: - Previous CT scan showed 3.1 cm nodule which was not PET avid.    No orders of the defined types were placed in this encounter.  Alben Deeds Teague,acting as a Neurosurgeon for [name], MD.,have documented all relevant documentation on the behalf of [name], MD,as directed by  [name], MD while in the presence of [name], MD.  ***    Pinon R Teague   12/26/20246:40 AM  CHIEF COMPLAINT:   Diagnosis: metastatic left breast cancer    Cancer Staging   No matching staging information was found for the patient.    Prior Therapy: none  Current Therapy:  Anastrozole, Ibrance    HISTORY OF PRESENT ILLNESS:   Oncology History   No history exists.     INTERVAL HISTORY:   Kyrielle is a 67 y.o. female presenting to clinic today for follow up of metastatic left breast cancer. She was last seen by me on 05/03/23.  Since her last visit, she was admitted to the hospital from 05/04/23 to 05/07/23 for severe cancer related pain. She was given oxycontin ER 20 mg BID and 5 mg oxycodone prn q4h. While hospitalized, she requested hospice and has been using their services after discharge. She was admitted to the hospital again on 07/03/23 for a pathological left femur and pelvic fractures and was given IV opioid-based analgesics. She was transferred to [facility] from 07/06/23 to 07/20/23 and underwent Left and Right IMN placement on 07/07/23. She was discharged with ms Contin 60 mg BID and oxycodone 10 mg q6h prn. She was admitted to the hospital from 08/16/23 to 08/19/23 for bilateral lower extremity edema. CXR showed acute cardiopulmonary disease and she was given IV vancomycin and rocephin.   Today, she states that she is doing well overall. Her appetite level is at ***%. Her energy level is at ***%.  PAST MEDICAL HISTORY:   Past Medical History: Past Medical History:  Diagnosis Date   Arthritis    Breast cancer (HCC)    Family history of breast cancer    High cholesterol    PONV (postoperative nausea and vomiting)     Surgical History: Past Surgical History:  Procedure Laterality Date   CHOLECYSTECTOMY     HUMERUS IM NAIL Right 01/22/2023   Procedure: INTRAMEDULLARY (IM) NAIL HUMERUS;  Surgeon: Yolonda Kida, MD;  Location: [facility];  Service: Orthopedics;  Laterality: Right;  90   neck surgery     plate placed    Social History: Social History   Socioeconomic History   Marital status: Unknown    Spouse name: Not on file   Number of  children: 2   Years of education: Not on file   Highest education level: Not on file  Occupational History   Occupation: retired  Tobacco Use   Smoking status: Never   Smokeless tobacco: Never  Vaping Use   Vaping status: Never Used  Substance and Sexual Activity   Alcohol use: Never   Drug use: Never   Sexual activity: Not Currently  Other Topics Concern   Not on file  Social History Narrative   Not on file   Social Drivers of Health   Financial Resource Strain: Low Risk  (07/17/2020)   Overall Financial Resource Strain (CARDIA)    Difficulty of Paying Living Expenses: Not hard at all  Food Insecurity: No Food Insecurity (08/16/2023)   Hunger Vital Sign    Worried About Running Out of Food in the Last Year: Never true    Ran Out of Food in the Last Year: Never true  Transportation Needs: No Transportation Needs (08/16/2023)   PRAPARE - Transportation    Lack of  Transportation (Medical): No    Lack of Transportation (Non-Medical): No  Physical Activity: Inactive (07/17/2020)   Exercise Vital Sign    Days of Exercise per Week: 0 days    Minutes of Exercise per Session: 0 min  Stress: No Stress Concern Present (07/17/2020)   [employer] of Occupational Health - Occupational Stress Questionnaire    Feeling of Stress : Not at all  Social Connections: Unknown (05/20/2023)   [facility] from [facility]   Social Network    Social Network: Not on file  Intimate Partner Violence: Not At Risk (08/16/2023)   Humiliation, Afraid, Rape, and Kick questionnaire    Fear of Current or Ex-Partner: No    Emotionally Abused: No    Physically Abused: No    Sexually Abused: No    Family History: Family History  Adopted: Yes  Problem Relation Age of Onset   Breast cancer Mother        dx 66s   Brain cancer Father        vs brain tumor, d. 21   Breast cancer Sister        dx 39   Breast cancer Sister        dx 13s   Liver cancer Brother    Alcohol abuse Brother    Asthma Son      Current Medications:  Current Outpatient Medications:    acetaminophen (TYLENOL) 500 MG tablet, Take 2 tablets (1,000 mg total) by mouth 3 (three) times daily., Disp: 180 tablet, Rfl: 0   apixaban (ELIQUIS) 5 MG TABS tablet, Take 5 mg by mouth 2 (two) times daily., Disp: , Rfl:    cyclobenzaprine (FLEXERIL) 10 MG tablet, Take 10 mg by mouth every 8 (eight) hours as needed for muscle spasms., Disp: , Rfl:    dexamethasone (DECADRON) 4 MG tablet, Take 1 tablet (4 mg total) by mouth daily., Disp: , Rfl:    leptospermum manuka honey (MEDIHONEY) PSTE paste, Apply 1 Application topically daily., Disp: 220 mL, Rfl: 0   morphine (MS CONTIN) 60 MG 12 hr tablet, Take 60 mg by mouth every 12 (twelve) hours., Disp: , Rfl:    ORSERDU 345 MG tablet, Take 1 tablet (345 mg total) by mouth daily., Disp: 30 tablet, Rfl: 3   Oxycodone HCl 10 MG TABS, Take 10 mg by mouth every 6 (six) hours as needed., Disp: , Rfl:    pantoprazole (PROTONIX) 40 MG tablet, Take 40 mg by mouth daily., Disp: , Rfl:    potassium chloride SA (KLOR-CON M) 20 MEQ tablet, Take 1 tablet (20 mEq total) by mouth daily., Disp: 30 tablet, Rfl: 0   prochlorperazine (COMPAZINE) 10 MG tablet, Take 1 tablet (10 mg total) by mouth every 6 (six) hours as needed for nausea or vomiting., Disp: 60 tablet, Rfl: 3   torsemide (DEMADEX) 20 MG tablet, Take 1 tablet (20 mg total) by mouth every other day., Disp: 14 tablet, Rfl: 0   Allergies: Allergies  Allergen Reactions   Codeine Nausea And Vomiting    REVIEW OF SYSTEMS:   Review of Systems  Constitutional:  Negative for chills, fatigue and fever.  HENT:   Negative for lump/mass, mouth sores, nosebleeds, sore throat and trouble swallowing.   Eyes:  Negative for eye problems.  Respiratory:  Negative for cough and shortness of breath.   Cardiovascular:  Negative for chest pain, leg swelling and palpitations.  Gastrointestinal:  Negative for abdominal pain, constipation, diarrhea, nausea and  vomiting.  Genitourinary:  Negative  for bladder incontinence, difficulty urinating, dysuria, frequency, hematuria and nocturia.   Musculoskeletal:  Negative for arthralgias, back pain, flank pain, myalgias and neck pain.  Skin:  Negative for itching and rash.  Neurological:  Negative for dizziness, headaches and numbness.  Hematological:  Does not bruise/bleed easily.  Psychiatric/Behavioral:  Negative for depression, sleep disturbance and suicidal ideas. The patient is not nervous/anxious.   All other systems reviewed and are negative.    VITALS:   There were no vitals taken for this visit.  Wt Readings from Last 3 Encounters:  08/19/23 194 lb 7.1 oz (88.2 kg)  07/06/23 170 lb 13.7 oz (77.5 kg)  06/03/23 185 lb (83.9 kg)    There is no height or weight on file to calculate BMI.  Performance status (ECOG): 1 - Symptomatic but completely ambulatory  PHYSICAL EXAM:   Physical Exam Vitals and nursing note reviewed. Exam conducted with a chaperone present.  Constitutional:      Appearance: Normal appearance.  Cardiovascular:     Rate and Rhythm: Normal rate and regular rhythm.     Pulses: Normal pulses.     Heart sounds: Normal heart sounds.  Pulmonary:     Effort: Pulmonary effort is normal.     Breath sounds: Normal breath sounds.  Abdominal:     Palpations: Abdomen is soft. There is no hepatomegaly, splenomegaly or mass.     Tenderness: There is no abdominal tenderness.  Musculoskeletal:     Right lower leg: No edema.     Left lower leg: No edema.  Lymphadenopathy:     Cervical: No cervical adenopathy.     Right cervical: No superficial, deep or posterior cervical adenopathy.    Left cervical: No superficial, deep or posterior cervical adenopathy.     Upper Body:     Right upper body: No supraclavicular or axillary adenopathy.     Left upper body: No supraclavicular or axillary adenopathy.  Neurological:     General: No focal deficit present.     Mental Status: She is  alert and oriented to person, place, and time.  Psychiatric:        Mood and Affect: Mood normal.        Behavior: Behavior normal.     LABS:      Latest Ref Rng & Units 08/19/2023    6:44 AM 08/18/2023   12:05 PM 08/17/2023    5:54 AM  CBC  WBC 4.0 - 10.5 K/uL 12.3  12.1  7.5   Hemoglobin 12.0 - 15.0 g/dL 9.2  9.6  8.1   Hematocrit 36.0 - 46.0 % 29.7  29.6  26.4   Platelets 150 - 400 K/uL 412  341  254       Latest Ref Rng & Units 08/19/2023    6:44 AM 08/18/2023   12:05 PM 08/17/2023    5:54 AM  CMP  Glucose 70 - 99 mg/dL 784  696  98   BUN 8 - 23 mg/dL 15  13  14    Creatinine 0.44 - 1.00 mg/dL 2.95  2.84  1.32   Sodium 135 - 145 mmol/L 137  137  138   Potassium 3.5 - 5.1 mmol/L 3.6  4.2  3.2   Chloride 98 - 111 mmol/L 99  101  100   CO2 22 - 32 mmol/L 29  26  27    Calcium 8.9 - 10.3 mg/dL 7.9  8.1  7.7   Total Protein 6.5 - 8.1 g/dL 5.2  4.6   Total Bilirubin <1.2 mg/dL 0.3   0.4   Alkaline Phos 38 - 126 U/L 289   255   AST 15 - 41 U/L 19   19   ALT 0 - 44 U/L 13   14      No results found for: "CEA1", "CEA" / No results found for: "CEA1", "CEA" No results found for: "PSA1" No results found for: "ZOX096" No results found for: "CAN125"  No results found for: "TOTALPROTELP", "ALBUMINELP", "A1GS", "A2GS", "BETS", "BETA2SER", "GAMS", "MSPIKE", "SPEI" No results found for: "TIBC", "FERRITIN", "IRONPCTSAT" No results found for: "LDH"   STUDIES:   ECHOCARDIOGRAM COMPLETE Result Date: 08/17/2023    ECHOCARDIOGRAM REPORT   Patient Name:   MCKAYA LYNG [facility] Date of Exam: 08/17/2023 Medical Rec #:  [id number]      Height:       64.0 in Accession #:    [id number]     Weight:       200.4 lb Date of Birth:  [birth year]      BSA:          1.958 m Patient Age:    67 years       BP:           104/58 mmHg Patient Gender: F              HR:           110 bpm. Exam Location:  Inpatient Procedure: 2D Echo, Cardiac Doppler, Color Doppler and Intracardiac            Opacification Agent  Indications:    Dyspnea  History:        Patient has no prior history of Echocardiogram examinations.  Sonographer:    Karma Ganja Referring Phys: [id number] [facility]  Sonographer Comments: Suboptimal apical window. Image acquisition challenging due to patient body habitus. IMPRESSIONS  1. Left ventricular ejection fraction, by estimation, is 70 to 75%. The left ventricle has hyperdynamic function. Left ventricular endocardial border not optimally defined to evaluate regional wall motion. Left ventricular diastolic parameters are consistent with Grade I diastolic dysfunction (impaired relaxation). There is the interventricular septum is flattened in systole and diastole, consistent with right ventricular pressure and volume overload.  2. Right ventricular systolic function is hyperdynamic. The right ventricular size is severely enlarged. Tricuspid regurgitation signal is inadequate for assessing PA pressure.  3. The mitral valve was not well visualized. No evidence of mitral valve regurgitation. No evidence of mitral stenosis.  4. The aortic valve is tricuspid. Aortic valve regurgitation is not visualized. No aortic stenosis is present.  5. The inferior vena cava is normal in size with greater than 50% respiratory variability, suggesting right atrial pressure of 3 mmHg. Comparison(s): No prior Echocardiogram. FINDINGS  Left Ventricle: Left ventricular ejection fraction, by estimation, is 70 to 75%. The left ventricle has hyperdynamic function. Left ventricular endocardial border not optimally defined to evaluate regional wall motion. Definity contrast agent was given IV to delineate the left ventricular endocardial borders. The left ventricular internal cavity size was normal in size. There is no left ventricular hypertrophy. The interventricular septum is flattened in systole and diastole, consistent with right ventricular pressure and volume overload. Left ventricular diastolic parameters are consistent with  Grade I diastolic dysfunction (impaired relaxation). Right Ventricle: The right ventricular size is severely enlarged. No increase in right ventricular wall thickness. Right ventricular systolic function is hyperdynamic. Tricuspid regurgitation signal is inadequate for assessing PA pressure. Left Atrium: Left  atrial size was normal in size. Right Atrium: Right atrial size was normal in size. Pericardium: There is no evidence of pericardial effusion. Mitral Valve: The mitral valve was not well visualized. No evidence of mitral valve regurgitation. No evidence of mitral valve stenosis. Tricuspid Valve: The tricuspid valve is not well visualized. Tricuspid valve regurgitation is not demonstrated. No evidence of tricuspid stenosis. Aortic Valve: The aortic valve is tricuspid. Aortic valve regurgitation is not visualized. No aortic stenosis is present. Aortic valve mean gradient measures 8.0 mmHg. Aortic valve peak gradient measures 14.0 mmHg. Aortic valve area, by VTI measures 1.90  cm. Pulmonic Valve: The pulmonic valve was not well visualized. Pulmonic valve regurgitation is not visualized. Aorta: The aortic root and ascending aorta are structurally normal, with no evidence of dilitation. Venous: The inferior vena cava is normal in size with greater than 50% respiratory variability, suggesting right atrial pressure of 3 mmHg. IAS/Shunts: The atrial septum is grossly normal.  LEFT VENTRICLE PLAX 2D LVIDd:         4.70 cm   Diastology LVIDs:         3.40 cm   LV e' medial:    6.64 cm/s LV PW:         0.90 cm   LV E/e' medial:  12.9 LV IVS:        0.90 cm   LV e' lateral:   11.00 cm/s LVOT diam:     2.00 cm   LV E/e' lateral: 7.8 LV SV:         49 LV SV Index:   25 LVOT Area:     3.14 cm  IVC IVC diam: 1.30 cm LEFT ATRIUM             Index LA diam:        3.50 cm 1.79 cm/m LA Vol (A2C):   46.3 ml 23.65 ml/m LA Vol (A4C):   50.4 ml 25.74 ml/m LA Biplane Vol: 48.0 ml 24.51 ml/m  AORTIC VALVE AV Area (Vmax):    1.86  cm AV Area (Vmean):   1.80 cm AV Area (VTI):     1.90 cm AV Vmax:           187.00 cm/s AV Vmean:          126.000 cm/s AV VTI:            0.256 m AV Peak Grad:      14.0 mmHg AV Mean Grad:      8.0 mmHg LVOT Vmax:         111.00 cm/s LVOT Vmean:        72.000 cm/s LVOT VTI:          0.155 m LVOT/AV VTI ratio: 0.61  AORTA Ao Root diam: 2.80 cm Ao Asc diam:  2.40 cm MITRAL VALVE               TRICUSPID VALVE MV Area (PHT): 4.86 cm    TR Peak grad:   30.5 mmHg MV Decel Time: 156 msec    TR Vmax:        276.00 cm/s MV E velocity: 85.70 cm/s MV A velocity: 97.70 cm/s  SHUNTS MV E/A ratio:  0.88        Systemic VTI:  0.16 m                            Systemic Diam: 2.00 cm Riley Lam MD Electronically  signed by Riley Lam MD Signature Date/Time: 08/17/2023/10:37:31 AM    Final    VAS [place] LOWER EXTREMITY VENOUS (DVT) (7a-7p) Result Date: 08/16/2023  Lower Venous DVT Study Patient Name:  LIEZL ZELADA [facility]  Date of Exam:   08/16/2023 Medical Rec #: [id number]       Accession #:    [id number] Date of Birth: June 08, 1956       Patient Gender: F Patient Age:   89 years Exam Location:  [facility] Procedure:      VAS [place] LOWER EXTREMITY VENOUS (DVT) Referring Phys: Theron Arista MESSICK --------------------------------------------------------------------------------  Indications: Edema.  Risk Factors: None identified. Limitations: Poor ultrasound/tissue interface, body habitus and patient positioning. Comparison Study: No prior studies. Performing Technologist: Chanda Busing RVT  Examination Guidelines: A complete evaluation includes B-mode imaging, spectral Doppler, color Doppler, and power Doppler as needed of all accessible portions of each vessel. Bilateral testing is considered an integral part of a complete examination. Limited examinations for reoccurring indications may be performed as noted. The reflux portion of the exam is performed with the patient in reverse Trendelenburg.   +---------+---------------+---------+-----------+----------+--------------+ RIGHT    CompressibilityPhasicitySpontaneityPropertiesThrombus Aging +---------+---------------+---------+-----------+----------+--------------+ CFV      Full           Yes      Yes                                 +---------+---------------+---------+-----------+----------+--------------+ SFJ      Full                                                        +---------+---------------+---------+-----------+----------+--------------+ FV Prox  Full                                                        +---------+---------------+---------+-----------+----------+--------------+ FV Mid                  Yes      Yes                                 +---------+---------------+---------+-----------+----------+--------------+ FV Distal               Yes      Yes                                 +---------+---------------+---------+-----------+----------+--------------+ PFV      Full                                                        +---------+---------------+---------+-----------+----------+--------------+ POP      Full           Yes      Yes                                 +---------+---------------+---------+-----------+----------+--------------+  PTV      Full                                                        +---------+---------------+---------+-----------+----------+--------------+ PERO     Full                                                        +---------+---------------+---------+-----------+----------+--------------+   +---------+---------------+---------+-----------+----------+-------------------+ LEFT     CompressibilityPhasicitySpontaneityPropertiesThrombus Aging      +---------+---------------+---------+-----------+----------+-------------------+ CFV      Full           Yes      Yes                                       +---------+---------------+---------+-----------+----------+-------------------+ SFJ      Full                                                             +---------+---------------+---------+-----------+----------+-------------------+ FV Prox  Full                                                             +---------+---------------+---------+-----------+----------+-------------------+ FV Mid                  Yes      Yes                                      +---------+---------------+---------+-----------+----------+-------------------+ FV Distal               Yes      Yes                                      +---------+---------------+---------+-----------+----------+-------------------+ PFV      Full                                                             +---------+---------------+---------+-----------+----------+-------------------+ POP      Full           Yes      Yes                                      +---------+---------------+---------+-----------+----------+-------------------+ PTV      Full                                                             +---------+---------------+---------+-----------+----------+-------------------+  PERO                                                  Not well visualized +---------+---------------+---------+-----------+----------+-------------------+     Summary: RIGHT: - There is no evidence of deep vein thrombosis in the lower extremity. However, portions of this examination were limited- see technologist comments above.  - No cystic structure found in the popliteal fossa.  LEFT: - There is no evidence of deep vein thrombosis in the lower extremity. However, portions of this examination were limited- see technologist comments above.  - No cystic structure found in the popliteal fossa.  *See table(s) above for measurements and observations. Electronically signed by Gerarda Fraction on 08/16/2023 at 4:08:00 PM.     Final    DG Foot Complete Left Result Date: 08/16/2023 CLINICAL DATA:  Pain, swelling, redness, bilaterally, open wound on left foot EXAM: LEFT FOOT - COMPLETE 3+ VIEW; RIGHT FOOT COMPLETE - 3+ VIEW COMPARISON:  None Available. FINDINGS: Left: Marked diffuse swelling about the left foot. No evidence of osteomyelitis. No acute fracture or dislocation. Soft tissue ulcer about the dorsum of the foot. Right: Diffuse soft tissue swelling about the right foot. No acute fracture or dislocation. No evidence of osteomyelitis. IMPRESSION: Diffuse soft tissue swelling about the bilateral feet, left greater than right. No evidence of osteomyelitis. Electronically Signed   By: Minerva Fester M.D.   On: 08/16/2023 16:06   DG Foot Complete Right Result Date: 08/16/2023 CLINICAL DATA:  Pain, swelling, redness, bilaterally, open wound on left foot EXAM: LEFT FOOT - COMPLETE 3+ VIEW; RIGHT FOOT COMPLETE - 3+ VIEW COMPARISON:  None Available. FINDINGS: Left: Marked diffuse swelling about the left foot. No evidence of osteomyelitis. No acute fracture or dislocation. Soft tissue ulcer about the dorsum of the foot. Right: Diffuse soft tissue swelling about the right foot. No acute fracture or dislocation. No evidence of osteomyelitis. IMPRESSION: Diffuse soft tissue swelling about the bilateral feet, left greater than right. No evidence of osteomyelitis. Electronically Signed   By: Minerva Fester M.D.   On: 08/16/2023 16:06   DG Chest Port 1 View Result Date: 08/16/2023 CLINICAL DATA:  Shortness of breath.  Bilateral lower leg swelling. EXAM: PORTABLE CHEST 1 VIEW COMPARISON:  CT chest 05/04/2023 and radiographs 12/07/2010 FINDINGS: Stable cardiomediastinal silhouette. Aortic atherosclerotic calcification. Chronic bronchitic changes. Platelike atelectasis in the left lower lung. The lungs are otherwise clear. No pleural effusion or pneumothorax. Postoperative changes right humerus. Cervical spine fusion hardware. Bilateral  remote rib fractures. 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 IMPRESSION: 1. No acute cardiopulmonary disease. 2. 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 Electronically Signed   By: Minerva Fester M.D.   On: 08/16/2023 16:03

## 2023-09-13 ENCOUNTER — Ambulatory Visit: Payer: Self-pay | Admitting: Nurse Practitioner

## 2023-10-16 DEATH — deceased
